# Patient Record
Sex: Female | Born: 1955
Health system: Southern US, Community
[De-identification: ages and names within clinical notes are randomized; demographics above are authoritative.]

## PROBLEM LIST (undated history)

## (undated) DIAGNOSIS — I252 Old myocardial infarction: Secondary | ICD-10-CM

## (undated) DIAGNOSIS — F32A Depression, unspecified: Secondary | ICD-10-CM

## (undated) DIAGNOSIS — M797 Fibromyalgia: Secondary | ICD-10-CM

## (undated) DIAGNOSIS — I509 Heart failure, unspecified: Secondary | ICD-10-CM

## (undated) DIAGNOSIS — G4730 Sleep apnea, unspecified: Secondary | ICD-10-CM

## (undated) DIAGNOSIS — I2510 Atherosclerotic heart disease of native coronary artery without angina pectoris: Secondary | ICD-10-CM

## (undated) DIAGNOSIS — I10 Essential (primary) hypertension: Secondary | ICD-10-CM

## (undated) DIAGNOSIS — J45998 Other asthma: Secondary | ICD-10-CM

## (undated) DIAGNOSIS — T7840XA Allergy, unspecified, initial encounter: Secondary | ICD-10-CM

## (undated) DIAGNOSIS — K2960 Other gastritis without bleeding: Secondary | ICD-10-CM

## (undated) DIAGNOSIS — K219 Gastro-esophageal reflux disease without esophagitis: Secondary | ICD-10-CM

## (undated) DIAGNOSIS — I160 Hypertensive urgency: Secondary | ICD-10-CM

## (undated) DIAGNOSIS — E559 Vitamin D deficiency, unspecified: Secondary | ICD-10-CM

## (undated) DIAGNOSIS — E785 Hyperlipidemia, unspecified: Secondary | ICD-10-CM

## (undated) DIAGNOSIS — Z8616 Personal history of COVID-19: Secondary | ICD-10-CM

## (undated) DIAGNOSIS — M1000 Idiopathic gout, unspecified site: Secondary | ICD-10-CM

## (undated) DIAGNOSIS — I219 Acute myocardial infarction, unspecified: Secondary | ICD-10-CM

## (undated) DIAGNOSIS — E042 Nontoxic multinodular goiter: Secondary | ICD-10-CM

## (undated) DIAGNOSIS — Z8619 Personal history of other infectious and parasitic diseases: Secondary | ICD-10-CM

## (undated) DIAGNOSIS — K2210 Ulcer of esophagus without bleeding: Secondary | ICD-10-CM

## (undated) DIAGNOSIS — K829 Disease of gallbladder, unspecified: Secondary | ICD-10-CM

## (undated) DIAGNOSIS — F329 Major depressive disorder, single episode, unspecified: Secondary | ICD-10-CM

## (undated) HISTORY — DX: Nontoxic multinodular goiter: E04.2

## (undated) HISTORY — PX: ABDOMINAL HYSTERECTOMY: SHX81

## (undated) HISTORY — DX: Vitamin D deficiency, unspecified: E55.9

## (undated) HISTORY — DX: Old myocardial infarction: I25.2

## (undated) HISTORY — DX: Allergy, unspecified, initial encounter: T78.40XA

## (undated) HISTORY — DX: Hyperlipidemia, unspecified: E78.5

## (undated) HISTORY — PX: HERNIA REPAIR: SHX51

## (undated) HISTORY — DX: Heart failure, unspecified: I50.9

## (undated) HISTORY — DX: Fibromyalgia: M79.7

## (undated) HISTORY — PX: APPENDECTOMY: SHX54

## (undated) HISTORY — DX: Personal history of other infectious and parasitic diseases: Z86.19

## (undated) HISTORY — DX: Gastro-esophageal reflux disease without esophagitis: K21.9

## (undated) HISTORY — DX: Acute myocardial infarction, unspecified: I21.9

## (undated) HISTORY — DX: Essential (primary) hypertension: I10

## (undated) HISTORY — DX: Hypertensive urgency: I16.0

## (undated) HISTORY — DX: Depression, unspecified: F32.A

## (undated) HISTORY — PX: CHOLECYSTECTOMY: SHX55

## (undated) HISTORY — PX: EYE SURGERY: SHX253

## (undated) HISTORY — PX: CARDIAC CATHETERIZATION: SHX172

## (undated) HISTORY — DX: Atherosclerotic heart disease of native coronary artery without angina pectoris: I25.10

## (undated) HISTORY — DX: Other gastritis without bleeding: K29.60

## (undated) HISTORY — DX: Disease of gallbladder, unspecified: K82.9

## (undated) HISTORY — DX: Other asthma: J45.998

## (undated) HISTORY — DX: Idiopathic gout, unspecified site: M10.00

## (undated) HISTORY — DX: Ulcer of esophagus without bleeding: K22.10

## (undated) HISTORY — DX: Sleep apnea, unspecified: G47.30

## (undated) HISTORY — PX: ROTATOR CUFF REPAIR: SHX139

## (undated) HISTORY — PX: TUBAL LIGATION: SHX77

## (undated) HISTORY — DX: Personal history of COVID-19: Z86.16

---
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
## 2014-08-14 DIAGNOSIS — I10 Essential (primary) hypertension: Secondary | ICD-10-CM | POA: Insufficient documentation

## 2014-08-14 DIAGNOSIS — I25119 Atherosclerotic heart disease of native coronary artery with unspecified angina pectoris: Secondary | ICD-10-CM | POA: Insufficient documentation

## 2014-08-14 DIAGNOSIS — E785 Hyperlipidemia, unspecified: Secondary | ICD-10-CM | POA: Insufficient documentation

## 2014-08-14 DIAGNOSIS — E782 Mixed hyperlipidemia: Secondary | ICD-10-CM | POA: Insufficient documentation

## 2014-08-14 DIAGNOSIS — I2510 Atherosclerotic heart disease of native coronary artery without angina pectoris: Secondary | ICD-10-CM | POA: Insufficient documentation

## 2014-08-14 HISTORY — DX: Mixed hyperlipidemia: E78.2

## 2014-09-11 DIAGNOSIS — M797 Fibromyalgia: Secondary | ICD-10-CM | POA: Insufficient documentation

## 2015-01-30 DIAGNOSIS — M25671 Stiffness of right ankle, not elsewhere classified: Secondary | ICD-10-CM | POA: Diagnosis not present

## 2015-01-30 DIAGNOSIS — M25571 Pain in right ankle and joints of right foot: Secondary | ICD-10-CM | POA: Diagnosis not present

## 2015-02-07 DIAGNOSIS — M25671 Stiffness of right ankle, not elsewhere classified: Secondary | ICD-10-CM | POA: Diagnosis not present

## 2015-02-07 DIAGNOSIS — M25571 Pain in right ankle and joints of right foot: Secondary | ICD-10-CM | POA: Diagnosis not present

## 2015-02-12 DIAGNOSIS — M25571 Pain in right ankle and joints of right foot: Secondary | ICD-10-CM | POA: Diagnosis not present

## 2015-02-12 DIAGNOSIS — M25671 Stiffness of right ankle, not elsewhere classified: Secondary | ICD-10-CM | POA: Diagnosis not present

## 2015-02-14 DIAGNOSIS — M25571 Pain in right ankle and joints of right foot: Secondary | ICD-10-CM | POA: Diagnosis not present

## 2015-02-14 DIAGNOSIS — M25671 Stiffness of right ankle, not elsewhere classified: Secondary | ICD-10-CM | POA: Diagnosis not present

## 2015-02-21 DIAGNOSIS — M25671 Stiffness of right ankle, not elsewhere classified: Secondary | ICD-10-CM | POA: Diagnosis not present

## 2015-02-21 DIAGNOSIS — M25571 Pain in right ankle and joints of right foot: Secondary | ICD-10-CM | POA: Diagnosis not present

## 2015-02-25 DIAGNOSIS — M76821 Posterior tibial tendinitis, right leg: Secondary | ICD-10-CM | POA: Diagnosis not present

## 2015-02-26 DIAGNOSIS — M25571 Pain in right ankle and joints of right foot: Secondary | ICD-10-CM | POA: Diagnosis not present

## 2015-02-26 DIAGNOSIS — M25671 Stiffness of right ankle, not elsewhere classified: Secondary | ICD-10-CM | POA: Diagnosis not present

## 2015-02-28 DIAGNOSIS — M25571 Pain in right ankle and joints of right foot: Secondary | ICD-10-CM | POA: Diagnosis not present

## 2015-02-28 DIAGNOSIS — M25671 Stiffness of right ankle, not elsewhere classified: Secondary | ICD-10-CM | POA: Diagnosis not present

## 2015-03-13 DIAGNOSIS — Z6841 Body Mass Index (BMI) 40.0 and over, adult: Secondary | ICD-10-CM | POA: Diagnosis not present

## 2015-03-13 DIAGNOSIS — Z1231 Encounter for screening mammogram for malignant neoplasm of breast: Secondary | ICD-10-CM | POA: Diagnosis not present

## 2015-03-13 DIAGNOSIS — Z13820 Encounter for screening for osteoporosis: Secondary | ICD-10-CM | POA: Diagnosis not present

## 2015-03-13 DIAGNOSIS — Z1211 Encounter for screening for malignant neoplasm of colon: Secondary | ICD-10-CM | POA: Diagnosis not present

## 2015-03-13 DIAGNOSIS — Z8541 Personal history of malignant neoplasm of cervix uteri: Secondary | ICD-10-CM | POA: Diagnosis not present

## 2015-03-13 DIAGNOSIS — Z0001 Encounter for general adult medical examination with abnormal findings: Secondary | ICD-10-CM | POA: Diagnosis not present

## 2015-03-14 DIAGNOSIS — M25571 Pain in right ankle and joints of right foot: Secondary | ICD-10-CM | POA: Diagnosis not present

## 2015-03-14 DIAGNOSIS — M25671 Stiffness of right ankle, not elsewhere classified: Secondary | ICD-10-CM | POA: Diagnosis not present

## 2015-03-22 DIAGNOSIS — Z1231 Encounter for screening mammogram for malignant neoplasm of breast: Secondary | ICD-10-CM | POA: Diagnosis not present

## 2015-03-26 DIAGNOSIS — R635 Abnormal weight gain: Secondary | ICD-10-CM | POA: Diagnosis not present

## 2015-03-26 DIAGNOSIS — N951 Menopausal and female climacteric states: Secondary | ICD-10-CM | POA: Diagnosis not present

## 2015-03-28 DIAGNOSIS — M25671 Stiffness of right ankle, not elsewhere classified: Secondary | ICD-10-CM | POA: Diagnosis not present

## 2015-03-28 DIAGNOSIS — M25571 Pain in right ankle and joints of right foot: Secondary | ICD-10-CM | POA: Diagnosis not present

## 2015-04-02 DIAGNOSIS — M25571 Pain in right ankle and joints of right foot: Secondary | ICD-10-CM | POA: Diagnosis not present

## 2015-04-02 DIAGNOSIS — M25671 Stiffness of right ankle, not elsewhere classified: Secondary | ICD-10-CM | POA: Diagnosis not present

## 2015-04-05 DIAGNOSIS — E538 Deficiency of other specified B group vitamins: Secondary | ICD-10-CM | POA: Diagnosis not present

## 2015-04-05 DIAGNOSIS — E161 Other hypoglycemia: Secondary | ICD-10-CM | POA: Diagnosis not present

## 2015-04-05 DIAGNOSIS — K219 Gastro-esophageal reflux disease without esophagitis: Secondary | ICD-10-CM | POA: Diagnosis not present

## 2015-04-05 DIAGNOSIS — N958 Other specified menopausal and perimenopausal disorders: Secondary | ICD-10-CM | POA: Diagnosis not present

## 2015-04-10 DIAGNOSIS — M25671 Stiffness of right ankle, not elsewhere classified: Secondary | ICD-10-CM | POA: Diagnosis not present

## 2015-04-10 DIAGNOSIS — M25571 Pain in right ankle and joints of right foot: Secondary | ICD-10-CM | POA: Diagnosis not present

## 2015-04-11 DIAGNOSIS — J4520 Mild intermittent asthma, uncomplicated: Secondary | ICD-10-CM | POA: Diagnosis not present

## 2015-04-11 DIAGNOSIS — F320 Major depressive disorder, single episode, mild: Secondary | ICD-10-CM | POA: Diagnosis not present

## 2015-04-11 DIAGNOSIS — E559 Vitamin D deficiency, unspecified: Secondary | ICD-10-CM | POA: Diagnosis not present

## 2015-04-11 DIAGNOSIS — I119 Hypertensive heart disease without heart failure: Secondary | ICD-10-CM | POA: Diagnosis not present

## 2015-04-11 DIAGNOSIS — E782 Mixed hyperlipidemia: Secondary | ICD-10-CM | POA: Diagnosis not present

## 2015-04-11 DIAGNOSIS — M797 Fibromyalgia: Secondary | ICD-10-CM | POA: Diagnosis not present

## 2015-04-11 DIAGNOSIS — Z6841 Body Mass Index (BMI) 40.0 and over, adult: Secondary | ICD-10-CM | POA: Diagnosis not present

## 2015-04-11 DIAGNOSIS — R7301 Impaired fasting glucose: Secondary | ICD-10-CM | POA: Diagnosis not present

## 2015-04-11 DIAGNOSIS — M791 Myalgia: Secondary | ICD-10-CM | POA: Diagnosis not present

## 2015-04-11 DIAGNOSIS — J45998 Other asthma: Secondary | ICD-10-CM | POA: Diagnosis not present

## 2015-04-15 DIAGNOSIS — E559 Vitamin D deficiency, unspecified: Secondary | ICD-10-CM | POA: Diagnosis not present

## 2015-04-15 DIAGNOSIS — K219 Gastro-esophageal reflux disease without esophagitis: Secondary | ICD-10-CM | POA: Diagnosis not present

## 2015-04-15 DIAGNOSIS — E161 Other hypoglycemia: Secondary | ICD-10-CM | POA: Diagnosis not present

## 2015-04-15 DIAGNOSIS — E538 Deficiency of other specified B group vitamins: Secondary | ICD-10-CM | POA: Diagnosis not present

## 2015-04-17 DIAGNOSIS — M25571 Pain in right ankle and joints of right foot: Secondary | ICD-10-CM | POA: Diagnosis not present

## 2015-04-17 DIAGNOSIS — M25671 Stiffness of right ankle, not elsewhere classified: Secondary | ICD-10-CM | POA: Diagnosis not present

## 2015-04-26 DIAGNOSIS — E559 Vitamin D deficiency, unspecified: Secondary | ICD-10-CM | POA: Diagnosis not present

## 2015-04-26 DIAGNOSIS — K219 Gastro-esophageal reflux disease without esophagitis: Secondary | ICD-10-CM | POA: Diagnosis not present

## 2015-04-26 DIAGNOSIS — E161 Other hypoglycemia: Secondary | ICD-10-CM | POA: Diagnosis not present

## 2015-04-26 DIAGNOSIS — E538 Deficiency of other specified B group vitamins: Secondary | ICD-10-CM | POA: Diagnosis not present

## 2015-05-03 DIAGNOSIS — K219 Gastro-esophageal reflux disease without esophagitis: Secondary | ICD-10-CM | POA: Diagnosis not present

## 2015-05-03 DIAGNOSIS — E538 Deficiency of other specified B group vitamins: Secondary | ICD-10-CM | POA: Diagnosis not present

## 2015-05-03 DIAGNOSIS — E161 Other hypoglycemia: Secondary | ICD-10-CM | POA: Diagnosis not present

## 2015-05-10 DIAGNOSIS — K219 Gastro-esophageal reflux disease without esophagitis: Secondary | ICD-10-CM | POA: Diagnosis not present

## 2015-05-10 DIAGNOSIS — E161 Other hypoglycemia: Secondary | ICD-10-CM | POA: Diagnosis not present

## 2015-05-10 DIAGNOSIS — E538 Deficiency of other specified B group vitamins: Secondary | ICD-10-CM | POA: Diagnosis not present

## 2015-05-17 DIAGNOSIS — E538 Deficiency of other specified B group vitamins: Secondary | ICD-10-CM | POA: Diagnosis not present

## 2015-05-17 DIAGNOSIS — K219 Gastro-esophageal reflux disease without esophagitis: Secondary | ICD-10-CM | POA: Diagnosis not present

## 2015-05-17 DIAGNOSIS — E161 Other hypoglycemia: Secondary | ICD-10-CM | POA: Diagnosis not present

## 2015-05-24 DIAGNOSIS — E538 Deficiency of other specified B group vitamins: Secondary | ICD-10-CM | POA: Diagnosis not present

## 2015-05-24 DIAGNOSIS — E161 Other hypoglycemia: Secondary | ICD-10-CM | POA: Diagnosis not present

## 2015-05-24 DIAGNOSIS — K219 Gastro-esophageal reflux disease without esophagitis: Secondary | ICD-10-CM | POA: Diagnosis not present

## 2015-05-31 DIAGNOSIS — K219 Gastro-esophageal reflux disease without esophagitis: Secondary | ICD-10-CM | POA: Diagnosis not present

## 2015-05-31 DIAGNOSIS — E161 Other hypoglycemia: Secondary | ICD-10-CM | POA: Diagnosis not present

## 2015-05-31 DIAGNOSIS — E538 Deficiency of other specified B group vitamins: Secondary | ICD-10-CM | POA: Diagnosis not present

## 2015-06-07 DIAGNOSIS — E538 Deficiency of other specified B group vitamins: Secondary | ICD-10-CM | POA: Diagnosis not present

## 2015-06-07 DIAGNOSIS — K219 Gastro-esophageal reflux disease without esophagitis: Secondary | ICD-10-CM | POA: Diagnosis not present

## 2015-06-07 DIAGNOSIS — E161 Other hypoglycemia: Secondary | ICD-10-CM | POA: Diagnosis not present

## 2015-06-14 DIAGNOSIS — K219 Gastro-esophageal reflux disease without esophagitis: Secondary | ICD-10-CM | POA: Diagnosis not present

## 2015-06-14 DIAGNOSIS — E161 Other hypoglycemia: Secondary | ICD-10-CM | POA: Diagnosis not present

## 2015-06-14 DIAGNOSIS — E538 Deficiency of other specified B group vitamins: Secondary | ICD-10-CM | POA: Diagnosis not present

## 2015-06-21 DIAGNOSIS — E538 Deficiency of other specified B group vitamins: Secondary | ICD-10-CM | POA: Diagnosis not present

## 2015-06-21 DIAGNOSIS — R635 Abnormal weight gain: Secondary | ICD-10-CM | POA: Diagnosis not present

## 2015-06-21 DIAGNOSIS — K219 Gastro-esophageal reflux disease without esophagitis: Secondary | ICD-10-CM | POA: Diagnosis not present

## 2015-06-21 DIAGNOSIS — E161 Other hypoglycemia: Secondary | ICD-10-CM | POA: Diagnosis not present

## 2015-06-22 DIAGNOSIS — H5203 Hypermetropia, bilateral: Secondary | ICD-10-CM | POA: Diagnosis not present

## 2015-06-22 DIAGNOSIS — H04123 Dry eye syndrome of bilateral lacrimal glands: Secondary | ICD-10-CM | POA: Diagnosis not present

## 2015-06-28 DIAGNOSIS — E538 Deficiency of other specified B group vitamins: Secondary | ICD-10-CM | POA: Diagnosis not present

## 2015-06-28 DIAGNOSIS — K219 Gastro-esophageal reflux disease without esophagitis: Secondary | ICD-10-CM | POA: Diagnosis not present

## 2015-06-28 DIAGNOSIS — E161 Other hypoglycemia: Secondary | ICD-10-CM | POA: Diagnosis not present

## 2015-07-05 DIAGNOSIS — E538 Deficiency of other specified B group vitamins: Secondary | ICD-10-CM | POA: Diagnosis not present

## 2015-07-05 DIAGNOSIS — E161 Other hypoglycemia: Secondary | ICD-10-CM | POA: Diagnosis not present

## 2015-07-05 DIAGNOSIS — K219 Gastro-esophageal reflux disease without esophagitis: Secondary | ICD-10-CM | POA: Diagnosis not present

## 2015-07-12 DIAGNOSIS — E161 Other hypoglycemia: Secondary | ICD-10-CM | POA: Diagnosis not present

## 2015-07-12 DIAGNOSIS — K219 Gastro-esophageal reflux disease without esophagitis: Secondary | ICD-10-CM | POA: Diagnosis not present

## 2015-07-12 DIAGNOSIS — E538 Deficiency of other specified B group vitamins: Secondary | ICD-10-CM | POA: Diagnosis not present

## 2015-07-17 DIAGNOSIS — I119 Hypertensive heart disease without heart failure: Secondary | ICD-10-CM | POA: Diagnosis not present

## 2015-07-17 DIAGNOSIS — M797 Fibromyalgia: Secondary | ICD-10-CM | POA: Diagnosis not present

## 2015-07-17 DIAGNOSIS — E559 Vitamin D deficiency, unspecified: Secondary | ICD-10-CM | POA: Diagnosis not present

## 2015-07-17 DIAGNOSIS — F320 Major depressive disorder, single episode, mild: Secondary | ICD-10-CM | POA: Diagnosis not present

## 2015-07-17 DIAGNOSIS — R7301 Impaired fasting glucose: Secondary | ICD-10-CM | POA: Diagnosis not present

## 2015-07-17 DIAGNOSIS — J45998 Other asthma: Secondary | ICD-10-CM | POA: Diagnosis not present

## 2015-07-17 DIAGNOSIS — Z6841 Body Mass Index (BMI) 40.0 and over, adult: Secondary | ICD-10-CM | POA: Diagnosis not present

## 2015-07-17 DIAGNOSIS — E782 Mixed hyperlipidemia: Secondary | ICD-10-CM | POA: Diagnosis not present

## 2015-07-26 DIAGNOSIS — E538 Deficiency of other specified B group vitamins: Secondary | ICD-10-CM | POA: Diagnosis not present

## 2015-07-26 DIAGNOSIS — K219 Gastro-esophageal reflux disease without esophagitis: Secondary | ICD-10-CM | POA: Diagnosis not present

## 2015-07-26 DIAGNOSIS — E161 Other hypoglycemia: Secondary | ICD-10-CM | POA: Diagnosis not present

## 2015-08-19 DIAGNOSIS — R1084 Generalized abdominal pain: Secondary | ICD-10-CM | POA: Diagnosis not present

## 2015-08-19 DIAGNOSIS — D3912 Neoplasm of uncertain behavior of left ovary: Secondary | ICD-10-CM | POA: Diagnosis not present

## 2015-08-19 DIAGNOSIS — R1012 Left upper quadrant pain: Secondary | ICD-10-CM | POA: Diagnosis not present

## 2015-09-20 DIAGNOSIS — R1012 Left upper quadrant pain: Secondary | ICD-10-CM | POA: Diagnosis not present

## 2015-09-20 DIAGNOSIS — D3912 Neoplasm of uncertain behavior of left ovary: Secondary | ICD-10-CM | POA: Diagnosis not present

## 2015-09-20 DIAGNOSIS — N281 Cyst of kidney, acquired: Secondary | ICD-10-CM | POA: Diagnosis not present

## 2015-09-20 DIAGNOSIS — I700 Atherosclerosis of aorta: Secondary | ICD-10-CM | POA: Diagnosis not present

## 2015-09-20 DIAGNOSIS — N83202 Unspecified ovarian cyst, left side: Secondary | ICD-10-CM | POA: Diagnosis not present

## 2015-10-02 DIAGNOSIS — R1012 Left upper quadrant pain: Secondary | ICD-10-CM | POA: Diagnosis not present

## 2015-10-02 DIAGNOSIS — D3912 Neoplasm of uncertain behavior of left ovary: Secondary | ICD-10-CM | POA: Diagnosis not present

## 2015-10-02 DIAGNOSIS — Z23 Encounter for immunization: Secondary | ICD-10-CM | POA: Diagnosis not present

## 2015-10-09 DIAGNOSIS — N281 Cyst of kidney, acquired: Secondary | ICD-10-CM | POA: Diagnosis not present

## 2015-10-10 DIAGNOSIS — N281 Cyst of kidney, acquired: Secondary | ICD-10-CM | POA: Diagnosis not present

## 2015-10-10 DIAGNOSIS — K760 Fatty (change of) liver, not elsewhere classified: Secondary | ICD-10-CM | POA: Diagnosis not present

## 2015-10-10 DIAGNOSIS — I700 Atherosclerosis of aorta: Secondary | ICD-10-CM | POA: Diagnosis not present

## 2015-10-10 DIAGNOSIS — N289 Disorder of kidney and ureter, unspecified: Secondary | ICD-10-CM | POA: Diagnosis not present

## 2015-10-28 DIAGNOSIS — E559 Vitamin D deficiency, unspecified: Secondary | ICD-10-CM | POA: Diagnosis not present

## 2015-10-28 DIAGNOSIS — I10 Essential (primary) hypertension: Secondary | ICD-10-CM | POA: Diagnosis not present

## 2015-10-28 DIAGNOSIS — E782 Mixed hyperlipidemia: Secondary | ICD-10-CM | POA: Diagnosis not present

## 2015-10-28 DIAGNOSIS — R7301 Impaired fasting glucose: Secondary | ICD-10-CM | POA: Diagnosis not present

## 2015-10-31 DIAGNOSIS — E782 Mixed hyperlipidemia: Secondary | ICD-10-CM | POA: Diagnosis not present

## 2015-10-31 DIAGNOSIS — F320 Major depressive disorder, single episode, mild: Secondary | ICD-10-CM | POA: Diagnosis not present

## 2015-10-31 DIAGNOSIS — R7301 Impaired fasting glucose: Secondary | ICD-10-CM | POA: Diagnosis not present

## 2015-10-31 DIAGNOSIS — Z6841 Body Mass Index (BMI) 40.0 and over, adult: Secondary | ICD-10-CM | POA: Diagnosis not present

## 2015-10-31 DIAGNOSIS — J45998 Other asthma: Secondary | ICD-10-CM | POA: Diagnosis not present

## 2015-10-31 DIAGNOSIS — M25571 Pain in right ankle and joints of right foot: Secondary | ICD-10-CM | POA: Diagnosis not present

## 2015-10-31 DIAGNOSIS — M797 Fibromyalgia: Secondary | ICD-10-CM | POA: Diagnosis not present

## 2015-10-31 DIAGNOSIS — E559 Vitamin D deficiency, unspecified: Secondary | ICD-10-CM | POA: Diagnosis not present

## 2015-10-31 DIAGNOSIS — I119 Hypertensive heart disease without heart failure: Secondary | ICD-10-CM | POA: Diagnosis not present

## 2016-02-11 DIAGNOSIS — K219 Gastro-esophageal reflux disease without esophagitis: Secondary | ICD-10-CM | POA: Diagnosis not present

## 2016-02-11 DIAGNOSIS — F320 Major depressive disorder, single episode, mild: Secondary | ICD-10-CM | POA: Diagnosis not present

## 2016-02-11 DIAGNOSIS — I10 Essential (primary) hypertension: Secondary | ICD-10-CM | POA: Diagnosis not present

## 2016-02-11 DIAGNOSIS — E782 Mixed hyperlipidemia: Secondary | ICD-10-CM | POA: Diagnosis not present

## 2016-02-11 DIAGNOSIS — E559 Vitamin D deficiency, unspecified: Secondary | ICD-10-CM | POA: Diagnosis not present

## 2016-02-11 DIAGNOSIS — M797 Fibromyalgia: Secondary | ICD-10-CM | POA: Diagnosis not present

## 2016-02-11 DIAGNOSIS — J45998 Other asthma: Secondary | ICD-10-CM | POA: Diagnosis not present

## 2016-02-11 DIAGNOSIS — I119 Hypertensive heart disease without heart failure: Secondary | ICD-10-CM | POA: Diagnosis not present

## 2016-02-11 DIAGNOSIS — R7301 Impaired fasting glucose: Secondary | ICD-10-CM | POA: Diagnosis not present

## 2016-02-11 DIAGNOSIS — Z6841 Body Mass Index (BMI) 40.0 and over, adult: Secondary | ICD-10-CM | POA: Diagnosis not present

## 2016-05-20 DIAGNOSIS — I10 Essential (primary) hypertension: Secondary | ICD-10-CM | POA: Diagnosis not present

## 2016-05-20 DIAGNOSIS — M25571 Pain in right ankle and joints of right foot: Secondary | ICD-10-CM | POA: Diagnosis not present

## 2016-05-20 DIAGNOSIS — R7301 Impaired fasting glucose: Secondary | ICD-10-CM | POA: Diagnosis not present

## 2016-05-20 DIAGNOSIS — F322 Major depressive disorder, single episode, severe without psychotic features: Secondary | ICD-10-CM | POA: Diagnosis not present

## 2016-05-20 DIAGNOSIS — Z6841 Body Mass Index (BMI) 40.0 and over, adult: Secondary | ICD-10-CM | POA: Diagnosis not present

## 2016-05-20 DIAGNOSIS — E782 Mixed hyperlipidemia: Secondary | ICD-10-CM | POA: Diagnosis not present

## 2016-05-20 DIAGNOSIS — I119 Hypertensive heart disease without heart failure: Secondary | ICD-10-CM | POA: Diagnosis not present

## 2016-06-09 DIAGNOSIS — I82811 Embolism and thrombosis of superficial veins of right lower extremities: Secondary | ICD-10-CM | POA: Diagnosis not present

## 2016-06-24 DIAGNOSIS — E663 Overweight: Secondary | ICD-10-CM | POA: Diagnosis not present

## 2016-06-24 DIAGNOSIS — M25571 Pain in right ankle and joints of right foot: Secondary | ICD-10-CM | POA: Diagnosis not present

## 2016-06-24 DIAGNOSIS — Z0001 Encounter for general adult medical examination with abnormal findings: Secondary | ICD-10-CM | POA: Diagnosis not present

## 2016-06-24 DIAGNOSIS — Z6841 Body Mass Index (BMI) 40.0 and over, adult: Secondary | ICD-10-CM | POA: Diagnosis not present

## 2016-06-26 DIAGNOSIS — M7731 Calcaneal spur, right foot: Secondary | ICD-10-CM | POA: Diagnosis not present

## 2016-06-26 DIAGNOSIS — M25571 Pain in right ankle and joints of right foot: Secondary | ICD-10-CM | POA: Diagnosis not present

## 2016-09-01 DIAGNOSIS — F322 Major depressive disorder, single episode, severe without psychotic features: Secondary | ICD-10-CM | POA: Diagnosis not present

## 2016-09-01 DIAGNOSIS — J45998 Other asthma: Secondary | ICD-10-CM | POA: Diagnosis not present

## 2016-09-01 DIAGNOSIS — Z13820 Encounter for screening for osteoporosis: Secondary | ICD-10-CM | POA: Diagnosis not present

## 2016-09-01 DIAGNOSIS — Z1231 Encounter for screening mammogram for malignant neoplasm of breast: Secondary | ICD-10-CM | POA: Diagnosis not present

## 2016-09-01 DIAGNOSIS — R7301 Impaired fasting glucose: Secondary | ICD-10-CM | POA: Diagnosis not present

## 2016-09-01 DIAGNOSIS — I119 Hypertensive heart disease without heart failure: Secondary | ICD-10-CM | POA: Diagnosis not present

## 2016-09-01 DIAGNOSIS — E782 Mixed hyperlipidemia: Secondary | ICD-10-CM | POA: Diagnosis not present

## 2016-09-01 DIAGNOSIS — Z6841 Body Mass Index (BMI) 40.0 and over, adult: Secondary | ICD-10-CM | POA: Diagnosis not present

## 2016-09-11 DIAGNOSIS — Z1231 Encounter for screening mammogram for malignant neoplasm of breast: Secondary | ICD-10-CM | POA: Diagnosis not present

## 2016-09-11 DIAGNOSIS — Z13820 Encounter for screening for osteoporosis: Secondary | ICD-10-CM | POA: Diagnosis not present

## 2016-09-11 DIAGNOSIS — M810 Age-related osteoporosis without current pathological fracture: Secondary | ICD-10-CM | POA: Diagnosis not present

## 2016-10-26 DIAGNOSIS — Z23 Encounter for immunization: Secondary | ICD-10-CM | POA: Diagnosis not present

## 2016-10-26 DIAGNOSIS — I119 Hypertensive heart disease without heart failure: Secondary | ICD-10-CM | POA: Diagnosis not present

## 2016-11-10 DIAGNOSIS — I119 Hypertensive heart disease without heart failure: Secondary | ICD-10-CM | POA: Diagnosis not present

## 2016-12-23 DIAGNOSIS — Z6841 Body Mass Index (BMI) 40.0 and over, adult: Secondary | ICD-10-CM | POA: Diagnosis not present

## 2016-12-23 DIAGNOSIS — F320 Major depressive disorder, single episode, mild: Secondary | ICD-10-CM | POA: Diagnosis not present

## 2016-12-23 DIAGNOSIS — E782 Mixed hyperlipidemia: Secondary | ICD-10-CM | POA: Diagnosis not present

## 2016-12-23 DIAGNOSIS — R7301 Impaired fasting glucose: Secondary | ICD-10-CM | POA: Diagnosis not present

## 2016-12-23 DIAGNOSIS — J45998 Other asthma: Secondary | ICD-10-CM | POA: Diagnosis not present

## 2016-12-23 DIAGNOSIS — I119 Hypertensive heart disease without heart failure: Secondary | ICD-10-CM | POA: Diagnosis not present

## 2017-02-02 DIAGNOSIS — W19XXXA Unspecified fall, initial encounter: Secondary | ICD-10-CM | POA: Diagnosis not present

## 2017-02-02 DIAGNOSIS — M25462 Effusion, left knee: Secondary | ICD-10-CM | POA: Diagnosis not present

## 2017-02-02 DIAGNOSIS — M25562 Pain in left knee: Secondary | ICD-10-CM | POA: Diagnosis not present

## 2017-02-03 DIAGNOSIS — M25562 Pain in left knee: Secondary | ICD-10-CM | POA: Diagnosis not present

## 2017-02-03 DIAGNOSIS — I10 Essential (primary) hypertension: Secondary | ICD-10-CM | POA: Diagnosis not present

## 2017-03-10 DIAGNOSIS — I25110 Atherosclerotic heart disease of native coronary artery with unstable angina pectoris: Secondary | ICD-10-CM | POA: Diagnosis not present

## 2017-03-10 DIAGNOSIS — I160 Hypertensive urgency: Secondary | ICD-10-CM | POA: Diagnosis not present

## 2017-03-10 DIAGNOSIS — R7301 Impaired fasting glucose: Secondary | ICD-10-CM | POA: Diagnosis not present

## 2017-03-10 DIAGNOSIS — R001 Bradycardia, unspecified: Secondary | ICD-10-CM | POA: Diagnosis not present

## 2017-03-10 DIAGNOSIS — I10 Essential (primary) hypertension: Secondary | ICD-10-CM | POA: Diagnosis not present

## 2017-03-10 DIAGNOSIS — E782 Mixed hyperlipidemia: Secondary | ICD-10-CM | POA: Diagnosis not present

## 2017-03-19 DIAGNOSIS — I700 Atherosclerosis of aorta: Secondary | ICD-10-CM | POA: Diagnosis not present

## 2017-03-19 DIAGNOSIS — N200 Calculus of kidney: Secondary | ICD-10-CM | POA: Diagnosis not present

## 2017-03-19 DIAGNOSIS — K760 Fatty (change of) liver, not elsewhere classified: Secondary | ICD-10-CM | POA: Diagnosis not present

## 2017-03-19 DIAGNOSIS — I160 Hypertensive urgency: Secondary | ICD-10-CM | POA: Diagnosis not present

## 2017-03-24 DIAGNOSIS — I119 Hypertensive heart disease without heart failure: Secondary | ICD-10-CM | POA: Diagnosis not present

## 2017-06-11 DIAGNOSIS — I119 Hypertensive heart disease without heart failure: Secondary | ICD-10-CM | POA: Diagnosis not present

## 2017-06-11 DIAGNOSIS — E782 Mixed hyperlipidemia: Secondary | ICD-10-CM | POA: Diagnosis not present

## 2017-06-11 DIAGNOSIS — R69 Illness, unspecified: Secondary | ICD-10-CM | POA: Diagnosis not present

## 2017-06-11 DIAGNOSIS — Z6841 Body Mass Index (BMI) 40.0 and over, adult: Secondary | ICD-10-CM | POA: Diagnosis not present

## 2017-06-11 DIAGNOSIS — J45998 Other asthma: Secondary | ICD-10-CM | POA: Diagnosis not present

## 2017-06-11 DIAGNOSIS — R7301 Impaired fasting glucose: Secondary | ICD-10-CM | POA: Diagnosis not present

## 2017-07-20 DIAGNOSIS — I252 Old myocardial infarction: Secondary | ICD-10-CM | POA: Diagnosis not present

## 2017-07-20 DIAGNOSIS — R32 Unspecified urinary incontinence: Secondary | ICD-10-CM | POA: Diagnosis not present

## 2017-07-20 DIAGNOSIS — E785 Hyperlipidemia, unspecified: Secondary | ICD-10-CM | POA: Diagnosis not present

## 2017-07-20 DIAGNOSIS — Z791 Long term (current) use of non-steroidal anti-inflammatories (NSAID): Secondary | ICD-10-CM | POA: Diagnosis not present

## 2017-07-20 DIAGNOSIS — K219 Gastro-esophageal reflux disease without esophagitis: Secondary | ICD-10-CM | POA: Diagnosis not present

## 2017-07-20 DIAGNOSIS — I10 Essential (primary) hypertension: Secondary | ICD-10-CM | POA: Diagnosis not present

## 2017-07-20 DIAGNOSIS — Z6841 Body Mass Index (BMI) 40.0 and over, adult: Secondary | ICD-10-CM | POA: Diagnosis not present

## 2017-07-20 DIAGNOSIS — G8929 Other chronic pain: Secondary | ICD-10-CM | POA: Diagnosis not present

## 2017-07-20 DIAGNOSIS — M810 Age-related osteoporosis without current pathological fracture: Secondary | ICD-10-CM | POA: Diagnosis not present

## 2017-09-20 DIAGNOSIS — R69 Illness, unspecified: Secondary | ICD-10-CM | POA: Diagnosis not present

## 2017-09-20 DIAGNOSIS — R7301 Impaired fasting glucose: Secondary | ICD-10-CM | POA: Diagnosis not present

## 2017-09-20 DIAGNOSIS — E782 Mixed hyperlipidemia: Secondary | ICD-10-CM | POA: Diagnosis not present

## 2017-09-20 DIAGNOSIS — I119 Hypertensive heart disease without heart failure: Secondary | ICD-10-CM | POA: Diagnosis not present

## 2017-09-20 DIAGNOSIS — J45998 Other asthma: Secondary | ICD-10-CM | POA: Diagnosis not present

## 2017-10-14 DIAGNOSIS — M25511 Pain in right shoulder: Secondary | ICD-10-CM | POA: Diagnosis not present

## 2017-10-20 DIAGNOSIS — Z0000 Encounter for general adult medical examination without abnormal findings: Secondary | ICD-10-CM | POA: Diagnosis not present

## 2017-10-20 DIAGNOSIS — M85852 Other specified disorders of bone density and structure, left thigh: Secondary | ICD-10-CM | POA: Diagnosis not present

## 2017-10-20 DIAGNOSIS — Z23 Encounter for immunization: Secondary | ICD-10-CM | POA: Diagnosis not present

## 2017-10-20 DIAGNOSIS — Z6841 Body Mass Index (BMI) 40.0 and over, adult: Secondary | ICD-10-CM | POA: Diagnosis not present

## 2017-10-26 DIAGNOSIS — Z1231 Encounter for screening mammogram for malignant neoplasm of breast: Secondary | ICD-10-CM | POA: Diagnosis not present

## 2017-12-22 DIAGNOSIS — I119 Hypertensive heart disease without heart failure: Secondary | ICD-10-CM | POA: Diagnosis not present

## 2017-12-22 DIAGNOSIS — R7301 Impaired fasting glucose: Secondary | ICD-10-CM | POA: Diagnosis not present

## 2017-12-22 DIAGNOSIS — M79672 Pain in left foot: Secondary | ICD-10-CM | POA: Diagnosis not present

## 2017-12-22 DIAGNOSIS — M79671 Pain in right foot: Secondary | ICD-10-CM | POA: Diagnosis not present

## 2017-12-22 DIAGNOSIS — I2510 Atherosclerotic heart disease of native coronary artery without angina pectoris: Secondary | ICD-10-CM | POA: Diagnosis not present

## 2017-12-22 DIAGNOSIS — R69 Illness, unspecified: Secondary | ICD-10-CM | POA: Diagnosis not present

## 2017-12-22 DIAGNOSIS — E782 Mixed hyperlipidemia: Secondary | ICD-10-CM | POA: Diagnosis not present

## 2017-12-22 DIAGNOSIS — J45998 Other asthma: Secondary | ICD-10-CM | POA: Diagnosis not present

## 2018-01-14 DIAGNOSIS — M10071 Idiopathic gout, right ankle and foot: Secondary | ICD-10-CM | POA: Diagnosis not present

## 2018-01-14 DIAGNOSIS — I10 Essential (primary) hypertension: Secondary | ICD-10-CM | POA: Diagnosis not present

## 2018-01-14 DIAGNOSIS — I119 Hypertensive heart disease without heart failure: Secondary | ICD-10-CM | POA: Diagnosis not present

## 2018-03-31 DIAGNOSIS — R7301 Impaired fasting glucose: Secondary | ICD-10-CM | POA: Diagnosis not present

## 2018-03-31 DIAGNOSIS — I2510 Atherosclerotic heart disease of native coronary artery without angina pectoris: Secondary | ICD-10-CM | POA: Diagnosis not present

## 2018-03-31 DIAGNOSIS — I119 Hypertensive heart disease without heart failure: Secondary | ICD-10-CM | POA: Diagnosis not present

## 2018-03-31 DIAGNOSIS — M79671 Pain in right foot: Secondary | ICD-10-CM | POA: Diagnosis not present

## 2018-03-31 DIAGNOSIS — J45998 Other asthma: Secondary | ICD-10-CM | POA: Diagnosis not present

## 2018-03-31 DIAGNOSIS — E782 Mixed hyperlipidemia: Secondary | ICD-10-CM | POA: Diagnosis not present

## 2018-03-31 DIAGNOSIS — F320 Major depressive disorder, single episode, mild: Secondary | ICD-10-CM | POA: Diagnosis not present

## 2018-04-26 ENCOUNTER — Other Ambulatory Visit: Payer: Self-pay | Admitting: *Deleted

## 2018-04-26 NOTE — Patient Outreach (Signed)
HTA HRA Follow up outreach. No answer, but able to leave a message and requested a return call.  Eulah Pont. Myrtie Neither, MSN, [facility] Gerontological Nurse Practitioner [facility] Care Management [phone]

## 2018-06-24 ENCOUNTER — Encounter: Payer: Self-pay | Admitting: *Deleted

## 2018-06-24 ENCOUNTER — Other Ambulatory Visit: Payer: Self-pay | Admitting: *Deleted

## 2018-06-24 NOTE — Patient Outreach (Signed)
HRA Telephone screen attempted, call #2. Left message to return my call. Advised I would be sending our information.  Eulah Pont. Myrtie Neither, MSN, [facility] Gerontological Nurse Practitioner [facility] Care Management [phone]

## 2018-06-27 ENCOUNTER — Other Ambulatory Visit: Payer: Self-pay

## 2018-06-27 ENCOUNTER — Encounter: Payer: Self-pay | Admitting: *Deleted

## 2018-06-27 ENCOUNTER — Other Ambulatory Visit: Payer: Self-pay | Admitting: *Deleted

## 2018-06-27 NOTE — Patient Outreach (Signed)
Completion of initial assessment. Cheryl Beard has a hx of MI, CAD, HTN, Hyperlipidemia, GERD, Fibromyalgia,  Gout, seasonal allergies.  She is married and very independent. She is able to drive. They are able to afford groceries and their medications.  She does not have a HCPOA or Living Will  Her main health issue is managing her CAD. Her diet could be improved but she and her husband do make some effort. They do not use salt but they do eat processed foods. They both could lose weight. They both need to exercise. These are the goals I would carry forward for this pt and her husband.    We discussed heart healthy diet, low fat item when available, lean meats, lots of fruits and veggies. Read labels and look for low salt/sodium content with goal of < 2000 mg a day.  Encouraged walking, every day. Avoid days when the air index is poor and exercise at the coolest part of the day during the summer.  Also discussed COVID19 safety precautions and home safety precautions.  Encouraged completion of  Advanced Directives, sent copy.  Cheryl Beard agreed to be in an every three month check in call schedule for CAD coaching.  Eulah Pont. Myrtie Neither, MSN, [facility] Gerontological Nurse Practitioner [facility] Care Management [phone]

## 2018-06-27 NOTE — Patient Outreach (Signed)
Outpatient Encounter Medications as of 06/27/2018  Medication Sig  . Albuterol Sulfate 108 (90 Base) MCG/ACT AEPB Inhale 2 puffs into the lungs as needed (for wheezing).  [name] allopurinol (ZYLOPRIM) 100 MG tablet Take 100 mg by mouth daily.  [name] aspirin EC 81 MG tablet Take 81 mg by mouth daily.  . Azilsartan Medoxomil (EDARBI) 80 MG TABS Take by mouth.  . calcium carbonate (TUMS - DOSED IN MG ELEMENTAL CALCIUM) 500 MG chewable tablet Chew 1 tablet by mouth 2 (two) times daily.  . chlorthalidone (HYGROTON) 50 MG tablet Take 50 mg by mouth daily.  . metoprolol succinate (TOPROL-XL) 50 MG 24 hr tablet Take 50 mg by mouth daily. Take 1/2 tab = 25 mg daily.  . Multiple Vitamins-Minerals (WOMENS MULTIVITAMIN PLUS PO) Take 1 tablet by mouth every morning.  . Omega-3 Fatty Acids (FISH OIL) 1000 MG CAPS Take 2 capsules by mouth daily.  . pantoprazole (PROTONIX) 40 MG tablet Take 40 mg by mouth daily.   No facility-administered encounter medications on file as of 06/27/2018.

## 2018-07-04 DIAGNOSIS — I119 Hypertensive heart disease without heart failure: Secondary | ICD-10-CM | POA: Diagnosis not present

## 2018-07-04 DIAGNOSIS — R7301 Impaired fasting glucose: Secondary | ICD-10-CM | POA: Diagnosis not present

## 2018-07-04 DIAGNOSIS — I2510 Atherosclerotic heart disease of native coronary artery without angina pectoris: Secondary | ICD-10-CM | POA: Diagnosis not present

## 2018-07-04 DIAGNOSIS — F320 Major depressive disorder, single episode, mild: Secondary | ICD-10-CM | POA: Diagnosis not present

## 2018-07-04 DIAGNOSIS — J45998 Other asthma: Secondary | ICD-10-CM | POA: Diagnosis not present

## 2018-07-04 DIAGNOSIS — E782 Mixed hyperlipidemia: Secondary | ICD-10-CM | POA: Diagnosis not present

## 2018-10-06 DIAGNOSIS — F320 Major depressive disorder, single episode, mild: Secondary | ICD-10-CM | POA: Diagnosis not present

## 2018-10-06 DIAGNOSIS — I2510 Atherosclerotic heart disease of native coronary artery without angina pectoris: Secondary | ICD-10-CM | POA: Diagnosis not present

## 2018-10-06 DIAGNOSIS — Z23 Encounter for immunization: Secondary | ICD-10-CM | POA: Diagnosis not present

## 2018-10-06 DIAGNOSIS — J45998 Other asthma: Secondary | ICD-10-CM | POA: Diagnosis not present

## 2018-10-06 DIAGNOSIS — E782 Mixed hyperlipidemia: Secondary | ICD-10-CM | POA: Diagnosis not present

## 2018-10-06 DIAGNOSIS — R7301 Impaired fasting glucose: Secondary | ICD-10-CM | POA: Diagnosis not present

## 2018-10-06 DIAGNOSIS — I119 Hypertensive heart disease without heart failure: Secondary | ICD-10-CM | POA: Diagnosis not present

## 2018-10-10 ENCOUNTER — Other Ambulatory Visit: Payer: PPO | Admitting: *Deleted

## 2018-10-26 DIAGNOSIS — Z6841 Body Mass Index (BMI) 40.0 and over, adult: Secondary | ICD-10-CM | POA: Diagnosis not present

## 2018-10-26 DIAGNOSIS — Z23 Encounter for immunization: Secondary | ICD-10-CM | POA: Diagnosis not present

## 2018-10-26 DIAGNOSIS — Z0000 Encounter for general adult medical examination without abnormal findings: Secondary | ICD-10-CM | POA: Diagnosis not present

## 2018-12-13 ENCOUNTER — Other Ambulatory Visit: Payer: Self-pay | Admitting: *Deleted

## 2018-12-31 DIAGNOSIS — J0100 Acute maxillary sinusitis, unspecified: Secondary | ICD-10-CM | POA: Diagnosis not present

## 2018-12-31 DIAGNOSIS — I10 Essential (primary) hypertension: Secondary | ICD-10-CM | POA: Diagnosis not present

## 2018-12-31 DIAGNOSIS — Z20828 Contact with and (suspected) exposure to other viral communicable diseases: Secondary | ICD-10-CM | POA: Diagnosis not present

## 2018-12-31 DIAGNOSIS — R110 Nausea: Secondary | ICD-10-CM | POA: Diagnosis not present

## 2018-12-31 DIAGNOSIS — R05 Cough: Secondary | ICD-10-CM | POA: Diagnosis not present

## 2019-01-05 DIAGNOSIS — R531 Weakness: Secondary | ICD-10-CM | POA: Diagnosis not present

## 2019-01-05 DIAGNOSIS — E860 Dehydration: Secondary | ICD-10-CM | POA: Diagnosis not present

## 2019-01-05 DIAGNOSIS — R112 Nausea with vomiting, unspecified: Secondary | ICD-10-CM | POA: Diagnosis not present

## 2019-01-05 DIAGNOSIS — U071 COVID-19: Secondary | ICD-10-CM | POA: Diagnosis not present

## 2019-01-18 DIAGNOSIS — I119 Hypertensive heart disease without heart failure: Secondary | ICD-10-CM | POA: Diagnosis not present

## 2019-01-18 DIAGNOSIS — E782 Mixed hyperlipidemia: Secondary | ICD-10-CM | POA: Diagnosis not present

## 2019-01-18 DIAGNOSIS — F320 Major depressive disorder, single episode, mild: Secondary | ICD-10-CM | POA: Diagnosis not present

## 2019-01-18 DIAGNOSIS — I2510 Atherosclerotic heart disease of native coronary artery without angina pectoris: Secondary | ICD-10-CM | POA: Diagnosis not present

## 2019-01-18 DIAGNOSIS — R7301 Impaired fasting glucose: Secondary | ICD-10-CM | POA: Diagnosis not present

## 2019-01-18 DIAGNOSIS — U071 COVID-19: Secondary | ICD-10-CM | POA: Diagnosis not present

## 2019-01-18 DIAGNOSIS — J45998 Other asthma: Secondary | ICD-10-CM | POA: Diagnosis not present

## 2019-01-18 DIAGNOSIS — J1289 Other viral pneumonia: Secondary | ICD-10-CM | POA: Diagnosis not present

## 2019-02-05 DIAGNOSIS — U071 COVID-19: Secondary | ICD-10-CM | POA: Diagnosis not present

## 2019-03-08 DIAGNOSIS — U071 COVID-19: Secondary | ICD-10-CM | POA: Diagnosis not present

## 2019-04-03 ENCOUNTER — Other Ambulatory Visit: Payer: Self-pay | Admitting: Family Medicine

## 2019-04-04 ENCOUNTER — Other Ambulatory Visit: Payer: Self-pay

## 2019-04-04 MED ORDER — ALLOPURINOL 100 MG PO TABS: 100.0000 mg | ORAL_TABLET | Freq: Every day | ORAL | 1 refills | Status: DC

## 2019-04-04 MED ORDER — METOPROLOL SUCCINATE ER 50 MG PO TB24: 25.0000 mg | ORAL_TABLET | Freq: Every day | ORAL | 0 refills | Status: DC

## 2019-04-04 MED ORDER — CHLORTHALIDONE 50 MG PO TABS: 50.0000 mg | ORAL_TABLET | Freq: Every day | ORAL | 0 refills | Status: DC

## 2019-04-05 DIAGNOSIS — U071 COVID-19: Secondary | ICD-10-CM | POA: Diagnosis not present

## 2019-04-26 ENCOUNTER — Other Ambulatory Visit: Payer: Self-pay | Admitting: Family Medicine

## 2019-05-06 DIAGNOSIS — U071 COVID-19: Secondary | ICD-10-CM | POA: Diagnosis not present

## 2019-05-09 ENCOUNTER — Encounter: Payer: Self-pay | Admitting: Family Medicine

## 2019-05-09 NOTE — Progress Notes (Signed)
Established Patient Office Visit  Subjective:  Patient ID: Cheryl Beard, female    DOB: May 11, 1955  Age: 64 y.o. MRN: [id number]  CC:  Chief Complaint  Patient presents with  . Depression  . Hyperlipidemia  . Gastroesophageal Reflux    HPI Pt presents with hyperlipidemia.  Current treatment includes Crestor.  Compliance with treatment has been good; she takes her medication as directed, maintains her low cholesterol diet, and follows up as directed.  She denies experiencing any hypercholesterolemia related symptoms.      Cheryl Beard presents with a diagnosis of impaired fasting glucose.  The course has been stable and nonprogressive.      Pt presents for follow up of hypertension.  Her current cardiac medication regimen includes a diuretic ( chlorthalidone ), a beta-blocker ( toprol xl ), and an angiotensin receptor blocker Cheryl Beard ).  She is tolerating the medication well without side effects.  Compliance with treatment has been good; she takes her medication as directed, maintains her diet and exercise regimen, and follows up as directed.  Hypertension associated with history of myocardial infarction.    Typical diet includes low carbohydrate and no sugar.  She follows a 1200-1500 calorie diet.  Primary form of exercise is walking.  She states that daily water intake is 100 oz ounces.  Compliance with treatment has been good; she maintains her diet and exercise regimen and follows up as directed.      [name] presents with a diagnosis of other asthma.  The course has been stable and nonprogressive.  Using Symbicort 2 puffs one to two times per day as needed.    In regard to the atherosclerotic heart disease of native coronary artery without angina pectoris, she is here today for routine follow-up.  Noncritical. taking statin and metoprolol.     Past Medical History:  Diagnosis Date  . Allergy   . CAD (coronary artery disease)   . Depression   . Erosive esophagitis   . Erosive  gastritis   . Gallbladder disease   . GERD (gastroesophageal reflux disease)   . History of Clostridium difficile infection   . History of COVID-19   . History of myocardial infarction   . Hyperlipidemia   . Hypertension   . Hypertensive urgency   . Idiopathic gout   . Myocardial infarction (Cheryl Beard)   . Nontoxic multinodular goiter   . Other asthma   . Vitamin D deficiency     Past Surgical History:  Procedure Laterality Date  . ABDOMINAL HYSTERECTOMY    . APPENDECTOMY    . CHOLECYSTECTOMY    . ROTATOR CUFF REPAIR      Family History  Problem Relation Age of Onset  . Cancer Mother   . Parkinson's disease Mother   . Diabetes Mother   . Hypertension Mother   . Stroke Father   . Hyperlipidemia Sister   . Hypertension Sister   . Alzheimer's disease Maternal Grandmother   . Fibromyalgia Sister   . Cancer Sister   . Cancer Paternal Grandmother        Leukemia    Social History   Socioeconomic History  . Marital status: Married    Spouse name: Not on file  . Number of children: 3  . Years of education: Not on file  . Highest education level: Not on file  Occupational History  . Not on file  Tobacco Use  . Smoking status: Former Research scientist (life sciences)  . Smokeless tobacco: Never Used  Substance and Sexual  Activity  . Alcohol use: Never  . Drug use: Never  . Sexual activity: Not on file  Other Topics Concern  . Not on file  Social History Narrative  . Not on file   Social Determinants of Health   Financial Resource Strain:   . Difficulty of Paying Living Expenses:   Food Insecurity:   . Worried About Charity fundraiser in the Last Year:   . Arboriculturist in the Last Year:   Transportation Needs:   . Film/video editor (Medical):   [name] Lack of Transportation (Non-Medical):   Physical Activity:   . Days of Exercise per Week:   . Minutes of Exercise per Session:   Stress:   . Feeling of Stress :   Social Connections:   . Frequency of Communication with Friends and  Family:   . Frequency of Social Gatherings with Friends and Family:   . Attends Religious Services:   . Active Member of Clubs or Organizations:   . Attends Archivist Meetings:   [name] Marital Status:   Intimate Partner Violence:   . Fear of Current or Ex-Partner:   . Emotionally Abused:   [name] Physically Abused:   . Sexually Abused:     Outpatient Medications Prior to Visit  Medication Sig Dispense Refill  . cholecalciferol (VITAMIN D3) 25 MCG (1000 UNIT) tablet Take 1,000 Units by mouth daily.    . Albuterol Sulfate 108 (90 Base) MCG/ACT AEPB Inhale 2 puffs into the lungs as needed (for wheezing).    [name] allopurinol (ZYLOPRIM) 100 MG tablet Take 1 tablet (100 mg total) by mouth daily. 90 tablet 1  . aspirin EC 81 MG tablet Take 81 mg by mouth daily.    . calcium carbonate (TUMS - DOSED IN MG ELEMENTAL CALCIUM) 500 MG chewable tablet Chew 1 tablet by mouth 2 (two) times daily.    . chlorthalidone (HYGROTON) 50 MG tablet Take 1 tablet (50 mg total) by mouth daily. 90 tablet 0  . EDARBI 80 MG TABS TAKE ONE TABLET BY MOUTH DAILY  90 tablet 3  . metoprolol succinate (TOPROL-XL) 50 MG 24 hr tablet Take 1 tablet (50 mg total) by mouth daily. Take 1/2 tab = 25 mg daily. 45 tablet 0  . Multiple Vitamins-Minerals (WOMENS MULTIVITAMIN PLUS PO) Take 1 tablet by mouth every morning.    . Omega-3 Fatty Acids (FISH OIL) 1000 MG CAPS Take 2 capsules by mouth daily.    . pantoprazole (PROTONIX) 40 MG tablet TAKE 1 TABLET BY MOUTH DAILY 90 tablet 3  . rosuvastatin (CRESTOR) 20 MG tablet Take 20 mg by mouth at bedtime.     No facility-administered medications prior to visit.    Allergies  Allergen Reactions  . Candesartan   . Clarithromycin   . Colesevelam   . Duloxetine   . Pregabalin     ROS Review of Systems  Constitutional: Positive for fatigue. Negative for chills, diaphoresis and fever.  HENT: Positive for congestion. Negative for ear pain, rhinorrhea and sore throat.        Ears  itch. PND.  Respiratory: Negative for cough and shortness of breath.   Cardiovascular: Negative for chest pain.  Gastrointestinal: Negative for abdominal pain, constipation, diarrhea, nausea and vomiting.       Indigestion about 3 times a a week. Awakens her in the middle of the night. Taking pantoprazole, not sure if is helping.  Patient has been on prilosec, which did not work.  Genitourinary: Negative for dysuria and urgency.  Musculoskeletal: Positive for arthralgias. Negative for back pain and myalgias.       Knee pain she thinks due to weight gain. Not exercising since covid 19 in December. She is eating healthy.  No gout flare up for several months and then had a gout flare up in her left toe. Did not take colchicine. Patient has been on allopurinol for more than 3 months.  Skin:       Hair falling out since December and had Covid 19.  Neurological: Negative for dizziness, weakness, light-headedness and headaches.  Psychiatric/Behavioral: Negative for dysphoric mood. The patient is nervous/anxious.       Objective:    Physical Exam  Constitutional: She is oriented to person, place, and time. She appears well-developed and well-nourished.  Obese.  HENT:  Head: Normocephalic.  Right Ear: External ear normal.  Left Ear: External ear normal.  Mouth/Throat: Oropharynx is clear and moist. No oropharyngeal exudate.  Cardiovascular: Normal rate, regular rhythm and normal heart sounds.  Pulmonary/Chest: Effort normal and breath sounds normal. No respiratory distress.  Abdominal: There is no abdominal tenderness.  Musculoskeletal:        General: No edema.  Neurological: She is alert and oriented to person, place, and time.  Skin:  Hair thinning.  Psychiatric: She has a normal mood and affect. Her behavior is normal.    BP 112/78   Pulse 84   Temp 97.7 F (36.5 C)   Resp 18   Ht 5\' 3"  (1.6 m)   Wt 272 lb (123.4 kg)   BMI 48.18 kg/m  Wt Readings from Last 3 Encounters:    05/10/19 272 lb (123.4 kg)     Health Maintenance Due  Topic Date Due  . Hepatitis C Screening  Never done  . HIV Screening  Never done  . COVID-19 Vaccine (1) Never done  . TETANUS/TDAP  Never done  . PAP SMEAR-Modifier  Never done  . MAMMOGRAM  Never done  . COLONOSCOPY  Never done  . DEXA SCAN  Never done  . PNA vac Low Risk Adult (1 of 2 - PCV13) Never done    There are no preventive care reminders to display for this patient.  Lab Results  Component Value Date   TSH 3.130 05/10/2019   Lab Results  Component Value Date   WBC 8.6 05/10/2019   HGB 13.2 05/10/2019   HCT 39.2 05/10/2019   MCV 93 05/10/2019   PLT 221 05/10/2019   Lab Results  Component Value Date   NA 141 05/10/2019   K 4.4 05/10/2019   CO2 23 05/10/2019   GLUCOSE 114 (H) 05/10/2019   BUN 27 05/10/2019   CREATININE 0.99 05/10/2019   BILITOT <0.2 05/10/2019   ALKPHOS 96 05/10/2019   AST 42 (H) 05/10/2019   ALT 42 (H) 05/10/2019   PROT 6.8 05/10/2019   ALBUMIN 4.4 05/10/2019   CALCIUM 9.5 05/10/2019   Lab Results  Component Value Date   CHOL 114 05/10/2019   Lab Results  Component Value Date   HDL 39 (L) 05/10/2019   Lab Results  Component Value Date   LDLCALC 51 05/10/2019   Lab Results  Component Value Date   TRIG 134 05/10/2019   Lab Results  Component Value Date   CHOLHDL 2.9 05/10/2019   No results found for: HGBA1C    Assessment & Plan:  1. Mixed hyperlipidemia Well controlled.  No changes to medicines.  Continue to work on  eating a healthy diet and exercise.  Labs drawn today.  - Lipid panel  2. Hypertensive heart disease without heart failure Well controlled.  No changes to medicines.  Continue to work on eating a healthy diet and exercise.  Labs drawn today.  - Comprehensive metabolic panel - CBC with Differential/Platelet  3. Nontoxic multinodular goiter - TSH  4. Chronic idiopathic gout involving toe of left foot without tophus Send colchicine 0.1 mg  one twice a day for 1 week as needed if has a gout flare up.  Continue allopurinol 100 mg once daily.   5. GERD without esophagitis Change pantoprazole to before supper or before bed x 2 weeks. If persistent heartburn increase to twice a day.  6. Impaired fasting glucose Low sugar diet.  7. Telogen effluvium Likely due to covid 19 infection. - Ferritin  8. Morbid obesity ([place]) Recommend eat healthy and exercise.  9. BMI 45.0-49.9, adult ([place]) Recommend eat healthy and exercise.   Orders Placed This Encounter  Procedures  . Lipid panel  . Comprehensive metabolic panel  . CBC with Differential/Platelet  . TSH  . Ferritin  . Cardiovascular Risk Assessment     Follow-up: Return in about 3 months (around 08/09/2019) for fasting.    [name], MD

## 2019-05-10 ENCOUNTER — Encounter: Payer: Self-pay | Admitting: Family Medicine

## 2019-05-10 ENCOUNTER — Other Ambulatory Visit: Payer: Self-pay

## 2019-05-10 ENCOUNTER — Ambulatory Visit (INDEPENDENT_AMBULATORY_CARE_PROVIDER_SITE_OTHER): Payer: PPO | Admitting: Family Medicine

## 2019-05-10 VITALS — BP 112/78 | HR 84 | Temp 97.7°F | Resp 18 | Ht 63.0 in | Wt 272.0 lb

## 2019-05-10 DIAGNOSIS — Z6841 Body Mass Index (BMI) 40.0 and over, adult: Secondary | ICD-10-CM

## 2019-05-10 DIAGNOSIS — K219 Gastro-esophageal reflux disease without esophagitis: Secondary | ICD-10-CM | POA: Insufficient documentation

## 2019-05-10 DIAGNOSIS — L650 Telogen effluvium: Secondary | ICD-10-CM | POA: Insufficient documentation

## 2019-05-10 DIAGNOSIS — E782 Mixed hyperlipidemia: Secondary | ICD-10-CM

## 2019-05-10 DIAGNOSIS — I119 Hypertensive heart disease without heart failure: Secondary | ICD-10-CM

## 2019-05-10 DIAGNOSIS — E042 Nontoxic multinodular goiter: Secondary | ICD-10-CM | POA: Diagnosis not present

## 2019-05-10 DIAGNOSIS — M1A0720 Idiopathic chronic gout, left ankle and foot, without tophus (tophi): Secondary | ICD-10-CM

## 2019-05-10 DIAGNOSIS — R7301 Impaired fasting glucose: Secondary | ICD-10-CM | POA: Diagnosis not present

## 2019-05-10 HISTORY — DX: Telogen effluvium: L65.0

## 2019-05-10 HISTORY — DX: Hypertensive heart disease without heart failure: I11.9

## 2019-05-10 HISTORY — DX: Idiopathic chronic gout, left ankle and foot, without tophus (tophi): M1A.0720

## 2019-05-10 HISTORY — DX: Gastro-esophageal reflux disease without esophagitis: K21.9

## 2019-05-10 NOTE — Patient Instructions (Addendum)
Gout - send colchicine 0.1 mg one twice a day for 1 week as needed if has a gout flare up.  Gerd - change pantoprazole to before supper or before bed x 2 weeks. If persistent heartburn increase to twice a day. Hair loss - check labs. Continue biotin.  Hypertension - continue current bp medications.  Impaired sugar - recommend diabetes. High cholesterol - continue current medication.  DASH Eating Plan DASH stands for "Dietary Approaches to Stop Hypertension." The DASH eating plan is a healthy eating plan that has been shown to reduce high blood pressure (hypertension). It may also reduce your risk for type 2 diabetes, heart disease, and stroke. The DASH eating plan may also help with weight loss. What are tips for following this plan?  General guidelines  Avoid eating more than 2,300 mg (milligrams) of salt (sodium) a day. If you have hypertension, you may need to reduce your sodium intake to 1,500 mg a day.  Limit alcohol intake to no more than 1 drink a day for nonpregnant women and 2 drinks a day for men. One drink equals 12 oz of beer, 5 oz of wine, or 1 oz of hard liquor.  Work with your health care provider to maintain a healthy body weight or to lose weight. Ask what an ideal weight is for you.  Get at least 30 minutes of exercise that causes your heart to beat faster (aerobic exercise) most days of the week. Activities may include walking, swimming, or biking.  Work with your health care provider or diet and nutrition specialist (dietitian) to adjust your eating plan to your individual calorie needs. Reading food labels   Check food labels for the amount of sodium per serving. Choose foods with less than 5 percent of the Daily Value of sodium. Generally, foods with less than 300 mg of sodium per serving fit into this eating plan.  To find whole grains, look for the word "whole" as the first word in the ingredient list. Shopping  Buy products labeled as "low-sodium" or "no salt  added."  Buy fresh foods. Avoid canned foods and premade or frozen meals. Cooking  Avoid adding salt when cooking. Use salt-free seasonings or herbs instead of table salt or sea salt. Check with your health care provider or pharmacist before using salt substitutes.  Do not fry foods. Cook foods using healthy methods such as baking, boiling, grilling, and broiling instead.  Cook with heart-healthy oils, such as olive, canola, soybean, or sunflower oil. Meal planning  Eat a balanced diet that includes: ? 5 or more servings of fruits and vegetables each day. At each meal, try to fill half of your plate with fruits and vegetables. ? Up to 6-8 servings of whole grains each day. ? Less than 6 oz of lean meat, poultry, or fish each day. A 3-oz serving of meat is about the same size as a deck of cards. One egg equals 1 oz. ? 2 servings of low-fat dairy each day. ? A serving of nuts, seeds, or beans 5 times each week. ? Heart-healthy fats. Healthy fats called Omega-3 fatty acids are found in foods such as flaxseeds and coldwater fish, like sardines, salmon, and mackerel.  Limit how much you eat of the following: ? Canned or prepackaged foods. ? Food that is high in trans fat, such as fried foods. ? Food that is high in saturated fat, such as fatty meat. ? Sweets, desserts, sugary drinks, and other foods with added sugar. ? Full-fat  dairy products.  Do not salt foods before eating.  Try to eat at least 2 vegetarian meals each week.  Eat more home-cooked food and less restaurant, buffet, and fast food.  When eating at a restaurant, ask that your food be prepared with less salt or no salt, if possible. What foods are recommended? The items listed may not be a complete list. Talk with your dietitian about what dietary choices are best for you. Grains Whole-grain or whole-wheat bread. Whole-grain or whole-wheat pasta. Brown rice. [place]. Bulgur. Whole-grain and low-sodium cereals.  Pita bread. Low-fat, low-sodium crackers. Whole-wheat flour tortillas. Vegetables Fresh or frozen vegetables (raw, steamed, roasted, or grilled). Low-sodium or reduced-sodium tomato and vegetable juice. Low-sodium or reduced-sodium tomato sauce and tomato paste. Low-sodium or reduced-sodium canned vegetables. Fruits All fresh, dried, or frozen fruit. Canned fruit in natural juice (without added sugar). Meat and other protein foods Skinless chicken or [place]. Ground chicken or [place]. Pork with fat trimmed off. Fish and seafood. Egg whites. Dried beans, peas, or lentils. Unsalted nuts, nut butters, and seeds. Unsalted canned beans. Lean cuts of beef with fat trimmed off. Low-sodium, lean deli meat. Dairy Low-fat (1%) or fat-free (skim) milk. Fat-free, low-fat, or reduced-fat cheeses. Nonfat, low-sodium ricotta or cottage cheese. Low-fat or nonfat yogurt. Low-fat, low-sodium cheese. Fats and oils Soft margarine without trans fats. Vegetable oil. Low-fat, reduced-fat, or light mayonnaise and salad dressings (reduced-sodium). Canola, safflower, olive, soybean, and sunflower oils. Avocado. Seasoning and other foods Herbs. Spices. Seasoning mixes without salt. Unsalted popcorn and pretzels. Fat-free sweets. What foods are not recommended? The items listed may not be a complete list. Talk with your dietitian about what dietary choices are best for you. Grains Baked goods made with fat, such as croissants, muffins, or some breads. Dry pasta or rice meal packs. Vegetables Creamed or fried vegetables. Vegetables in a cheese sauce. Regular canned vegetables (not low-sodium or reduced-sodium). Regular canned tomato sauce and paste (not low-sodium or reduced-sodium). Regular tomato and vegetable juice (not low-sodium or reduced-sodium). Angie Fava. Olives. Fruits Canned fruit in a light or heavy syrup. Fried fruit. Fruit in cream or butter sauce. Meat and other protein foods Fatty cuts of meat. Ribs. Fried  meat. Berniece Salines. Sausage. Bologna and other processed lunch meats. Salami. Fatback. Hotdogs. Bratwurst. Salted nuts and seeds. Canned beans with added salt. Canned or smoked fish. Whole eggs or egg yolks. Chicken or [place] with skin. Dairy Whole or 2% milk, cream, and half-and-half. Whole or full-fat cream cheese. Whole-fat or sweetened yogurt. Full-fat cheese. Nondairy creamers. Whipped toppings. Processed cheese and cheese spreads. Fats and oils Butter. Stick margarine. Lard. Shortening. Ghee. Bacon fat. Tropical oils, such as coconut, palm kernel, or palm oil. Seasoning and other foods Salted popcorn and pretzels. Onion salt, garlic salt, seasoned salt, table salt, and sea salt. Worcestershire sauce. Tartar sauce. Barbecue sauce. Teriyaki sauce. Soy sauce, including reduced-sodium. Steak sauce. Canned and packaged gravies. Fish sauce. Oyster sauce. Cocktail sauce. Horseradish that you find on the shelf. Ketchup. Mustard. Meat flavorings and tenderizers. Bouillon cubes. Hot sauce and Tabasco sauce. Premade or packaged marinades. Premade or packaged taco seasonings. Relishes. Regular salad dressings. Where to find more information:  National Heart, Lung, and [place]: https://wilson-eaton.com/  American Heart Association: www.heart.org Summary  The DASH eating plan is a healthy eating plan that has been shown to reduce high blood pressure (hypertension). It may also reduce your risk for type 2 diabetes, heart disease, and stroke.  With the DASH eating plan, you should  limit salt (sodium) intake to 2,300 mg a day. If you have hypertension, you may need to reduce your sodium intake to 1,500 mg a day.  When on the DASH eating plan, aim to eat more fresh fruits and vegetables, whole grains, lean proteins, low-fat dairy, and heart-healthy fats.  Work with your health care provider or diet and nutrition specialist (dietitian) to adjust your eating plan to your individual calorie needs. This information is  not intended to replace advice given to you by your health care provider. Make sure you discuss any questions you have with your health care provider. Document Revised: 12/25/2016 Document Reviewed: 01/06/2016 Elsevier Patient Education  [place] and Cholesterol Restricted Eating Plan Getting too much fat and cholesterol in your diet may cause health problems. Choosing the right foods helps keep your fat and cholesterol at normal levels. This can keep you from getting certain diseases. What are tips for following this plan? Meal planning  At meals, divide your plate into four equal parts: ? Fill one-half of your plate with vegetables and green salads. ? Fill one-fourth of your plate with whole grains. ? Fill one-fourth of your plate with low-fat (lean) protein foods.  Eat fish that is high in omega-3 fats at least two times a week. This includes mackerel, tuna, sardines, and salmon.  Eat foods that are high in fiber, such as whole grains, beans, apples, broccoli, carrots, peas, and barley. General tips   Work with your doctor to lose weight if you need to.  Avoid: ? Foods with added sugar. ? Fried foods. ? Foods with partially hydrogenated oils.  Limit alcohol intake to no more than 1 drink a day for nonpregnant women and 2 drinks a day for men. One drink equals 12 oz of beer, 5 oz of wine, or 1 oz of hard liquor. Reading food labels  Check food labels for: ? Trans fats. ? Partially hydrogenated oils. ? Saturated fat (g) in each serving. ? Cholesterol (mg) in each serving. ? Fiber (g) in each serving.  Choose foods with healthy fats, such as: ? Monounsaturated fats. ? Polyunsaturated fats. ? Omega-3 fats.  Choose grain products that have whole grains. Look for the word "whole" as the first word in the ingredient list. Cooking  Cook foods using low-fat methods. These include baking, boiling, grilling, and broiling.  Eat more home-cooked foods. Eat at  restaurants and buffets less often.  Avoid cooking using saturated fats, such as butter, cream, palm oil, palm kernel oil, and coconut oil. Recommended foods  Fruits  All fresh, canned (in natural juice), or frozen fruits. Vegetables  Fresh or frozen vegetables (raw, steamed, roasted, or grilled). Green salads. Grains  Whole grains, such as whole wheat or whole grain breads, crackers, cereals, and pasta. Unsweetened oatmeal, bulgur, barley, quinoa, or brown rice. Corn or whole wheat flour tortillas. Meats and other protein foods  Ground beef (85% or leaner), grass-fed beef, or beef trimmed of fat. Skinless chicken or [place]. Ground chicken or [place]. Pork trimmed of fat. All fish and seafood. Egg whites. Dried beans, peas, or lentils. Unsalted nuts or seeds. Unsalted canned beans. Nut butters without added sugar or oil. Dairy  Low-fat or nonfat dairy products, such as skim or 1% milk, 2% or reduced-fat cheeses, low-fat and fat-free ricotta or cottage cheese, or plain low-fat and nonfat yogurt. Fats and oils  Tub margarine without trans fats. Light or reduced-fat mayonnaise and salad dressings. Avocado. Olive, canola, sesame, or safflower oils.  The items listed above may not be a complete list of foods and beverages you can eat. Contact a dietitian for more information. Foods to avoid Fruits  Canned fruit in heavy syrup. Fruit in cream or butter sauce. Fried fruit. Vegetables  Vegetables cooked in cheese, cream, or butter sauce. Fried vegetables. Grains  White bread. White pasta. White rice. Cornbread. Bagels, pastries, and croissants. Crackers and snack foods that contain trans fat and hydrogenated oils. Meats and other protein foods  Fatty cuts of meat. Ribs, chicken wings, bacon, sausage, bologna, salami, chitterlings, fatback, hot dogs, bratwurst, and packaged lunch meats. Liver and organ meats. Whole eggs and egg yolks. Chicken and [place] with skin. Fried meat. Dairy  Whole  or 2% milk, cream, half-and-half, and cream cheese. Whole milk cheeses. Whole-fat or sweetened yogurt. Full-fat cheeses. Nondairy creamers and whipped toppings. Processed cheese, cheese spreads, and cheese curds. Beverages  Alcohol. Sugar-sweetened drinks such as sodas, lemonade, and fruit drinks. Fats and oils  Butter, stick margarine, lard, shortening, ghee, or bacon fat. Coconut, palm kernel, and palm oils. Sweets and desserts  Corn syrup, sugars, honey, and molasses. Candy. Jam and jelly. Syrup. Sweetened cereals. Cookies, pies, cakes, donuts, muffins, and ice cream. The items listed above may not be a complete list of foods and beverages you should avoid. Contact a dietitian for more information. Summary  Choosing the right foods helps keep your fat and cholesterol at normal levels. This can keep you from getting certain diseases.  At meals, fill one-half of your plate with vegetables and green salads.  Eat high-fiber foods, like whole grains, beans, apples, carrots, peas, and barley.  Limit added sugar, saturated fats, alcohol, and fried foods. This information is not intended to replace advice given to you by your health care provider. Make sure you discuss any questions you have with your health care provider. Document Revised: 09/15/2017 Document Reviewed: 09/29/2016 Elsevier Patient Education  [place].

## 2019-05-11 LAB — CBC WITH DIFFERENTIAL/PLATELET
Basophils Absolute: 0.1 10*3/uL (ref 0.0–0.2)
Basos: 1 %
EOS (ABSOLUTE): 0.1 10*3/uL (ref 0.0–0.4)
Eos: 2 %
Hematocrit: 39.2 % (ref 34.0–46.6)
Hemoglobin: 13.2 g/dL (ref 11.1–15.9)
Immature Grans (Abs): 0 10*3/uL (ref 0.0–0.1)
Immature Granulocytes: 0 %
Lymphocytes Absolute: 2.6 10*3/uL (ref 0.7–3.1)
Lymphs: 30 %
MCH: 31.3 pg (ref 26.6–33.0)
MCHC: 33.7 g/dL (ref 31.5–35.7)
MCV: 93 fL (ref 79–97)
Monocytes Absolute: 0.6 10*3/uL (ref 0.1–0.9)
Monocytes: 7 %
Neutrophils Absolute: 5.1 10*3/uL (ref 1.4–7.0)
Neutrophils: 60 %
Platelets: 221 10*3/uL (ref 150–450)
RBC: 4.22 x10E6/uL (ref 3.77–5.28)
RDW: 13.1 % (ref 11.7–15.4)
WBC: 8.6 10*3/uL (ref 3.4–10.8)

## 2019-05-11 LAB — COMPREHENSIVE METABOLIC PANEL
ALT: 42 IU/L — ABNORMAL HIGH (ref 0–32)
AST: 42 IU/L — ABNORMAL HIGH (ref 0–40)
Albumin/Globulin Ratio: 1.8 (ref 1.2–2.2)
Albumin: 4.4 g/dL (ref 3.8–4.8)
Alkaline Phosphatase: 96 IU/L (ref 39–117)
BUN/Creatinine Ratio: 27 (ref 12–28)
BUN: 27 mg/dL (ref 8–27)
Bilirubin Total: <0.2 mg/dL (ref 0.0–1.2)
CO2: 23 mmol/L (ref 20–29)
Calcium: 9.5 mg/dL (ref 8.7–10.3)
Chloride: 102 mmol/L (ref 96–106)
Creatinine, Ser: 0.99 mg/dL (ref 0.57–1.00)
GFR calc Af Amer: 69 mL/min/{1.73_m2} (ref >59)
GFR calc non Af Amer: 60 mL/min/{1.73_m2} (ref >59)
Globulin, Total: 2.4 g/dL (ref 1.5–4.5)
Glucose: 114 mg/dL — ABNORMAL HIGH (ref 65–99)
Potassium: 4.4 mmol/L (ref 3.5–5.2)
Sodium: 141 mmol/L (ref 134–144)
Total Protein: 6.8 g/dL (ref 6.0–8.5)

## 2019-05-11 LAB — LIPID PANEL
Chol/HDL Ratio: 2.9 ratio (ref 0.0–4.4)
Cholesterol, Total: 114 mg/dL (ref 100–199)
HDL: 39 mg/dL — ABNORMAL LOW (ref >39)
LDL Chol Calc (NIH): 51 mg/dL (ref 0–99)
Triglycerides: 134 mg/dL (ref 0–149)
VLDL Cholesterol Cal: 24 mg/dL (ref 5–40)

## 2019-05-11 LAB — CARDIOVASCULAR RISK ASSESSMENT

## 2019-05-11 LAB — TSH: TSH: 3.13 u[IU]/mL (ref 0.450–4.500)

## 2019-05-11 LAB — FERRITIN: Ferritin: 190 ng/mL — ABNORMAL HIGH (ref 15–150)

## 2019-05-14 ENCOUNTER — Encounter: Payer: Self-pay | Admitting: Family Medicine

## 2019-05-14 DIAGNOSIS — Z6841 Body Mass Index (BMI) 40.0 and over, adult: Secondary | ICD-10-CM | POA: Insufficient documentation

## 2019-05-14 HISTORY — DX: Morbid (severe) obesity due to excess calories: E66.01

## 2019-05-20 ENCOUNTER — Other Ambulatory Visit: Payer: Self-pay | Admitting: Family Medicine

## 2019-07-01 ENCOUNTER — Other Ambulatory Visit: Payer: Self-pay | Admitting: Family Medicine

## 2019-07-20 ENCOUNTER — Other Ambulatory Visit: Payer: Self-pay

## 2019-07-20 MED ORDER — EDARBI 80 MG PO TABS: 1.0000 | ORAL_TABLET | Freq: Every day | ORAL | 0 refills | Status: DC

## 2019-08-10 ENCOUNTER — Encounter: Payer: Self-pay | Admitting: Family Medicine

## 2019-08-10 ENCOUNTER — Other Ambulatory Visit: Payer: Self-pay

## 2019-08-10 ENCOUNTER — Ambulatory Visit (INDEPENDENT_AMBULATORY_CARE_PROVIDER_SITE_OTHER): Payer: PPO | Admitting: Family Medicine

## 2019-08-10 VITALS — BP 116/74 | HR 74 | Temp 97.6°F | Ht 62.0 in | Wt 272.0 lb

## 2019-08-10 DIAGNOSIS — I119 Hypertensive heart disease without heart failure: Secondary | ICD-10-CM | POA: Diagnosis not present

## 2019-08-10 DIAGNOSIS — E782 Mixed hyperlipidemia: Secondary | ICD-10-CM

## 2019-08-10 DIAGNOSIS — J45909 Unspecified asthma, uncomplicated: Secondary | ICD-10-CM

## 2019-08-10 DIAGNOSIS — I10 Essential (primary) hypertension: Secondary | ICD-10-CM | POA: Diagnosis not present

## 2019-08-10 DIAGNOSIS — K219 Gastro-esophageal reflux disease without esophagitis: Secondary | ICD-10-CM | POA: Diagnosis not present

## 2019-08-10 DIAGNOSIS — R7301 Impaired fasting glucose: Secondary | ICD-10-CM

## 2019-08-10 HISTORY — DX: Unspecified asthma, uncomplicated: J45.909

## 2019-08-10 LAB — BASIC METABOLIC PANEL
BUN/Creatinine Ratio: 19 (ref 12–28)
BUN: 25 mg/dL (ref 8–27)
CO2: 25 mmol/L (ref 20–29)
Calcium: 10 mg/dL (ref 8.7–10.3)
Chloride: 99 mmol/L (ref 96–106)
Creatinine, Ser: 1.3 mg/dL — ABNORMAL HIGH (ref 0.57–1.00)
GFR calc Af Amer: 50 mL/min/{1.73_m2} — ABNORMAL LOW (ref >59)
GFR calc non Af Amer: 43 mL/min/{1.73_m2} — ABNORMAL LOW (ref >59)
Glucose: 112 mg/dL — ABNORMAL HIGH (ref 65–99)
Potassium: 4.4 mmol/L (ref 3.5–5.2)
Sodium: 138 mmol/L (ref 134–144)

## 2019-08-10 LAB — HEMOGLOBIN A1C
Est. average glucose Bld gHb Est-mCnc: 126 mg/dL
Hgb A1c MFr Bld: 6 % — ABNORMAL HIGH (ref 4.8–5.6)

## 2019-08-10 MED ORDER — ALBUTEROL SULFATE 108 (90 BASE) MCG/ACT IN AEPB: 2.0000 | INHALATION_SPRAY | RESPIRATORY_TRACT | 2 refills | Status: DC | PRN

## 2019-08-10 NOTE — Progress Notes (Signed)
Established Patient Office Visit  Subjective:  Patient ID: Cheryl Beard, female    DOB: 27-Mar-1955  Age: 64 y.o. MRN: [id number]  CC:  Chief Complaint  Patient presents with  . Hypertension  . Hyperlipidemia    HPI Pt presents with hyperlipidemia.  Current treatment includes Crestor.  Compliance with treatment has been good; she takes her medication as directed, maintains her low cholesterol diet, and follows up as directed.  She denies experiencing any hypercholesterolemia related symptoms.      Cheryl Beard presents with a diagnosis of impaired fasting glucose.  The course has been stable and nonprogressive.      Pt presents for follow up of hypertension.  Her current cardiac medication regimen includes a diuretic ( chlorthalidone ), a beta-blocker ( toprol xl ), and an angiotensin receptor blocker Earnest Rosier ).  She is tolerating the medication well without side effects.  Compliance with treatment has been good; she takes her medication as directed, Hypertension associated with history of myocardial infarction. Pt states little exercise as husband currently admitted to the hospital with concern for CAD      Cheryl Beard presents with a diagnosis of other asthma.  The course has been stable and nonprogressive.  Using Symbicort 2 puffs one to two times per day as needed. Pt has been using albuterol due to heat as a trigger  GERD-uses Protonix daily Gout-no flares with allopurinol    In regard to the atherosclerotic heart disease of native coronary artery without angina pectoris, she is here today for routine follow-up.  Noncritical. taking statin and metoprolol.     Past Medical History:  Diagnosis Date  . Allergy   . CAD (coronary artery disease)   . Depression   . Erosive esophagitis   . Erosive gastritis   . Gallbladder disease   . GERD (gastroesophageal reflux disease)   . History of Clostridium difficile infection   . History of COVID-19   . History of myocardial infarction     . Hyperlipidemia   . Hypertension   . Hypertensive urgency   . Idiopathic gout   . Myocardial infarction ([place])   . Nontoxic multinodular goiter   . Other asthma   . Vitamin D deficiency     Past Surgical History:  Procedure Laterality Date  . ABDOMINAL HYSTERECTOMY    . APPENDECTOMY    . CHOLECYSTECTOMY    . ROTATOR CUFF REPAIR      Family History  Problem Relation Age of Onset  . Cancer Mother   . Parkinson's disease Mother   . Diabetes Mother   . Hypertension Mother   . Stroke Father   . Hyperlipidemia Sister   . Hypertension Sister   . Alzheimer's disease Maternal Grandmother   . Fibromyalgia Sister   . Cancer Sister   . Cancer Paternal Grandmother        Leukemia    Social History   Socioeconomic History  . Marital status: Married    Spouse name: Not on file  . Number of children: 3  . Years of education: Not on file  . Highest education level: Not on file  Occupational History  . Not on file  Tobacco Use  . Smoking status: Former Research scientist (life sciences)  . Smokeless tobacco: Never Used  Substance and Sexual Activity  . Alcohol use: Never  . Drug use: Never  . Sexual activity: Not on file  Other Topics Concern  . Not on file  Social History Narrative  . Not on file  Social Determinants of Health   Financial Resource Strain:   . Difficulty of Paying Living Expenses:   Food Insecurity:   . Worried About Charity fundraiser in the Last Year:   . Arboriculturist in the Last Year:   Transportation Needs:   . Film/video editor (Medical):   [name] Lack of Transportation (Non-Medical):   Physical Activity:   . Days of Exercise per Week:   . Minutes of Exercise per Session:   Stress:   . Feeling of Stress :   Social Connections:   . Frequency of Communication with Friends and Family:   . Frequency of Social Gatherings with Friends and Family:   . Attends Religious Services:   . Active Member of Clubs or Organizations:   . Attends Archivist Meetings:    [name] Marital Status:   Intimate Partner Violence:   . Fear of Current or Ex-Partner:   . Emotionally Abused:   [name] Physically Abused:   . Sexually Abused:     Outpatient Medications Prior to Visit  Medication Sig Dispense Refill  . Albuterol Sulfate 108 (90 Base) MCG/ACT AEPB Inhale 2 puffs into the lungs as needed (for wheezing).    [name] allopurinol (ZYLOPRIM) 100 MG tablet Take 1 tablet (100 mg total) by mouth daily. 90 tablet 1  . aspirin EC 81 MG tablet Take 81 mg by mouth daily.    . calcium carbonate (TUMS - DOSED IN MG ELEMENTAL CALCIUM) 500 MG chewable tablet Chew 1 tablet by mouth 2 (two) times daily.    . chlorthalidone (HYGROTON) 50 MG tablet TAKE 1 TABLET(50 MG) BY MOUTH DAILY 90 tablet 0  . cholecalciferol (VITAMIN D3) 25 MCG (1000 UNIT) tablet Take 1,000 Units by mouth daily.    [name] EDARBI 80 MG TABS Take 1 tablet (80 mg total) by mouth daily. 90 tablet 0  . metoprolol succinate (TOPROL-XL) 50 MG 24 hr tablet TAKE 1/2 TABLET BY MOUTH EVERY DAY 45 tablet 0  . Multiple Vitamins-Minerals (WOMENS MULTIVITAMIN PLUS PO) Take 1 tablet by mouth every morning.    . Omega-3 Fatty Acids (FISH OIL) 1000 MG CAPS Take 2 capsules by mouth daily.    . pantoprazole (PROTONIX) 40 MG tablet TAKE 1 TABLET BY MOUTH DAILY 90 tablet 3  . rosuvastatin (CRESTOR) 20 MG tablet TAKE 1 TABLET BY MOUTH EVERY DAY AT NIGHT 90 tablet 0   No facility-administered medications prior to visit.    Allergies  Allergen Reactions  . Candesartan   . Clarithromycin   . Colesevelam   . Duloxetine   . Pregabalin     ROS Review of Systems  Constitutional: Positive for fatigue. Negative for chills, diaphoresis and fever.  HENT: Negative for congestion, ear pain, rhinorrhea and sore throat.        Ears itch. PND.  Respiratory: Negative for cough and shortness of breath.   Cardiovascular: Positive for leg swelling. Negative for chest pain.  Gastrointestinal: Negative for abdominal pain, constipation, diarrhea,  nausea and vomiting.       Indigestion about 3 times a a week. Awakens her in the middle of the night. Taking pantoprazole, not sure if is helping.  Patient has been on prilosec, which did not work.   Genitourinary: Negative for dysuria and urgency.  Musculoskeletal: Positive for arthralgias. Negative for back pain and myalgias.       Knee pain she thinks due to weight gain. Not exercising since covid 19 in December. She is eating  healthy.  No gout flare up for several months and then had a gout flare up in her left toe. Did not take colchicine. Patient has been on allopurinol for more than 3 months.  Skin:       Hair falling out since December and had Covid 19.  Neurological: Negative for dizziness, weakness, light-headedness and headaches.  Psychiatric/Behavioral: Negative for dysphoric mood. The patient is nervous/anxious.       Objective:    Physical Exam Constitutional:      Appearance: Normal appearance. She is well-developed.     Comments: Obese.  HENT:     Head: Normocephalic.     Right Ear: External ear normal.     Left Ear: External ear normal.     Mouth/Throat:     Pharynx: No oropharyngeal exudate.  Cardiovascular:     Rate and Rhythm: Normal rate and regular rhythm.     Heart sounds: Normal heart sounds.  Pulmonary:     Effort: Pulmonary effort is normal. No respiratory distress.     Breath sounds: Normal breath sounds.  Abdominal:     Tenderness: There is no abdominal tenderness.  Musculoskeletal:     Right lower leg: Edema present.     Left lower leg: Edema present.  Skin:    Comments: Hair thinning.  Neurological:     Mental Status: She is alert and oriented to person, place, and time.  Psychiatric:        Mood and Affect: Mood normal.        Behavior: Behavior normal.     Temp 97.6 F (36.4 C)   Ht 5\' 2"  (1.575 m)   Wt 272 lb (123.4 kg)   BMI 49.75 kg/m  Wt Readings from Last 3 Encounters:  08/10/19 272 lb (123.4 kg)  05/10/19 272 lb (123.4 kg)      Health Maintenance Due  Topic Date Due  . Hepatitis C Screening  Never done  . COVID-19 Vaccine (1) Never done  . HIV Screening  Never done  . TETANUS/TDAP  Never done  . PAP SMEAR-Modifier  Never done  . MAMMOGRAM  Never done  . COLONOSCOPY  Never done  . DEXA SCAN  Never done  . PNA vac Low Risk Adult (1 of 2 - PCV13) Never done     Lab Results  Component Value Date   TSH 3.130 05/10/2019   Lab Results  Component Value Date   WBC 8.6 05/10/2019   HGB 13.2 05/10/2019   HCT 39.2 05/10/2019   MCV 93 05/10/2019   PLT 221 05/10/2019   Lab Results  Component Value Date   NA 141 05/10/2019   K 4.4 05/10/2019   CO2 23 05/10/2019   GLUCOSE 114 (H) 05/10/2019   BUN 27 05/10/2019   CREATININE 0.99 05/10/2019   BILITOT <0.2 05/10/2019   ALKPHOS 96 05/10/2019   AST 42 (H) 05/10/2019   ALT 42 (H) 05/10/2019   PROT 6.8 05/10/2019   ALBUMIN 4.4 05/10/2019   CALCIUM 9.5 05/10/2019   Lab Results  Component Value Date   CHOL 114 05/10/2019   Lab Results  Component Value Date   HDL 39 (L) 05/10/2019   Lab Results  Component Value Date   LDLCALC 51 05/10/2019   Lab Results  Component Value Date   TRIG 134 05/10/2019   Lab Results  Component Value Date   CHOLHDL 2.9 05/10/2019     Assessment & Plan:  1. Mixed hyperlipidemia Well controlled.  No changes  to medicines.  Continue to work on eating a healthy diet and exercise.  Reviewed labwork   2. Hypertensive heart disease without heart failure Well controlled.  No changes to medicines.  Continue to work on eating a healthy diet and exercise.  Labs drawn today.  BMP  3. Nontoxic multinodular goiter - TSH-3.130  4. 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 No flares   5. GERD without esophagitis protonix-rx continue-use at night for improved effectiveness  6. Impaired fasting glucose A1c   7. Asthma-albuterol-rx,   Follow-up:  3 months

## 2019-08-22 ENCOUNTER — Other Ambulatory Visit: Payer: Self-pay | Admitting: Family Medicine

## 2019-08-25 ENCOUNTER — Other Ambulatory Visit: Payer: Self-pay | Admitting: Family Medicine

## 2019-09-14 ENCOUNTER — Encounter: Payer: Self-pay | Admitting: Family Medicine

## 2019-09-14 ENCOUNTER — Ambulatory Visit (INDEPENDENT_AMBULATORY_CARE_PROVIDER_SITE_OTHER): Payer: PPO | Admitting: Family Medicine

## 2019-09-14 ENCOUNTER — Other Ambulatory Visit: Payer: Self-pay

## 2019-09-14 VITALS — BP 106/68 | HR 76 | Temp 97.5°F | Resp 18 | Ht 62.0 in | Wt 264.0 lb

## 2019-09-14 DIAGNOSIS — K0889 Other specified disorders of teeth and supporting structures: Secondary | ICD-10-CM | POA: Diagnosis not present

## 2019-09-14 DIAGNOSIS — J320 Chronic maxillary sinusitis: Secondary | ICD-10-CM | POA: Diagnosis not present

## 2019-09-14 MED ORDER — AMITRIPTYLINE HCL 25 MG PO TABS: 25.0000 mg | ORAL_TABLET | Freq: Every day | ORAL | 1 refills | Status: DC

## 2019-09-14 MED ORDER — AMOXICILLIN 875 MG PO TABS
875.0000 mg | ORAL_TABLET | Freq: Two times a day (BID) | ORAL | 0 refills | Status: DC
Start: 2019-09-14 — End: 2019-11-20

## 2019-09-14 NOTE — Progress Notes (Signed)
Acute Office Visit  Subjective:    Patient ID: Cheryl Beard, female    DOB: [birth year], 64 y.o.   MRN: [id number]  Chief Complaint  Patient presents with  . Dental Pain    HPI Patient is in today for left sided facial pain and toothache. She has seen the dentist ([facility]) and has no cavity, infections, or gum disease. Makes her left eye water when it hurts. Hurting since covid 19 in 12/2018.  Has not had any antibiotics since 12/2019.   Past Medical History:  Diagnosis Date  . Allergy   . CAD (coronary artery disease)   . Depression   . Erosive esophagitis   . Erosive gastritis   . Gallbladder disease   . GERD (gastroesophageal reflux disease)   . History of Clostridium difficile infection   . History of COVID-19   . History of myocardial infarction   . Hyperlipidemia   . Hypertension   . Hypertensive urgency   . Idiopathic gout   . Myocardial infarction ([place])   . Nontoxic multinodular goiter   . Other asthma   . Vitamin D deficiency     Past Surgical History:  Procedure Laterality Date  . ABDOMINAL HYSTERECTOMY    . APPENDECTOMY    . CHOLECYSTECTOMY    . ROTATOR CUFF REPAIR      Family History  Problem Relation Age of Onset  . Cancer Mother   . Parkinson's disease Mother   . Diabetes Mother   . Hypertension Mother   . Stroke Father   . Hyperlipidemia Sister   . Hypertension Sister   . Alzheimer's disease Maternal Grandmother   . Fibromyalgia Sister   . Cancer Sister   . Cancer Paternal Grandmother        Leukemia    Social History   Socioeconomic History  . Marital status: Married    Spouse name: Not on file  . Number of children: 3  . Years of education: Not on file  . Highest education level: Not on file  Occupational History  . Not on file  Tobacco Use  . Smoking status: Former Smoker    Quit date: 2002    Years since quitting: 19.6  . Smokeless tobacco: Never Used  Substance and Sexual Activity  . Alcohol use: Never    . Drug use: Never  . Sexual activity: Not on file  Other Topics Concern  . Not on file  Social History Narrative  . Not on file   Social Determinants of Health   Financial Resource Strain:   . Difficulty of Paying Living Expenses: Not on file  Food Insecurity:   . Worried About Charity fundraiser in the Last Year: Not on file  . Ran Out of Food in the Last Year: Not on file  Transportation Needs:   . Lack of Transportation (Medical): Not on file  . Lack of Transportation (Non-Medical): Not on file  Physical Activity:   . Days of Exercise per Week: Not on file  . Minutes of Exercise per Session: Not on file  Stress:   . Feeling of Stress : Not on file  Social Connections:   . Frequency of Communication with Friends and Family: Not on file  . Frequency of Social Gatherings with Friends and Family: Not on file  . Attends Religious Services: Not on file  . Active Member of Clubs or Organizations: Not on file  . Attends Archivist Meetings: Not on file  .  Marital Status: Not on file  Intimate Partner Violence:   . Fear of Current or Ex-Partner: Not on file  . Emotionally Abused: Not on file  . Physically Abused: Not on file  . Sexually Abused: Not on file    Outpatient Medications Prior to Visit  Medication Sig Dispense Refill  . Albuterol Sulfate (PROAIR RESPICLICK) 277 (90 Base) MCG/ACT AEPB Inhale 2 puffs into the lungs as needed (for wheezing). 1 each 2  . allopurinol (ZYLOPRIM) 100 MG tablet Take 1 tablet (100 mg total) by mouth daily. 90 tablet 1  . aspirin EC 81 MG tablet Take 81 mg by mouth daily.    . calcium carbonate (TUMS - DOSED IN MG ELEMENTAL CALCIUM) 500 MG chewable tablet Chew 1 tablet by mouth 2 (two) times daily.    . chlorthalidone (HYGROTON) 50 MG tablet TAKE 1 TABLET(50 MG) BY MOUTH DAILY 90 tablet 0  . cholecalciferol (VITAMIN D3) 25 MCG (1000 UNIT) tablet Take 1,000 Units by mouth daily.    . colchicine 0.6 MG tablet Take 0.6 mg by mouth  daily.    [name] EDARBI 80 MG TABS Take 1 tablet (80 mg total) by mouth daily. 90 tablet 0  . metoprolol succinate (TOPROL-XL) 50 MG 24 hr tablet TAKE 1/2 TABLET BY MOUTH EVERY DAY 45 tablet 0  . Multiple Vitamins-Minerals (WOMENS MULTIVITAMIN PLUS PO) Take 1 tablet by mouth every morning.    . Omega-3 Fatty Acids (FISH OIL) 1000 MG CAPS Take 2 capsules by mouth daily.    . pantoprazole (PROTONIX) 40 MG tablet TAKE 1 TABLET BY MOUTH DAILY 90 tablet 3  . rosuvastatin (CRESTOR) 20 MG tablet TAKE 1 TABLET BY MOUTH EVERY DAY AT NIGHT 90 tablet 0   No facility-administered medications prior to visit.    Allergies  Allergen Reactions  . Candesartan   . Clarithromycin   . Colesevelam   . Duloxetine   . Pregabalin     Review of Systems  Constitutional: Positive for fatigue. Negative for chills and fever.  HENT: Positive for congestion. Negative for ear discharge, rhinorrhea and sore throat.        Left sided toothache  Respiratory: Negative for cough and shortness of breath.   Cardiovascular: Negative for chest pain and palpitations.  Gastrointestinal: Negative for abdominal pain, constipation and diarrhea.  Neurological: Negative for weakness and headaches.       Objective:    Physical Exam Vitals reviewed.  Constitutional:      Appearance: Normal appearance.  HENT:     Right Ear: Tympanic membrane normal.     Left Ear: Tympanic membrane normal.     Nose: Nose normal.     Mouth/Throat:     Mouth: Mucous membranes are moist.  Cardiovascular:     Rate and Rhythm: Normal rate and regular rhythm.     Heart sounds: Normal heart sounds.  Pulmonary:     Effort: Pulmonary effort is normal. No respiratory distress.     Breath sounds: Normal breath sounds.  Neurological:     Mental Status: She is alert.     BP 106/68   Pulse 76   Temp (!) 97.5 F (36.4 C)   Resp 18   Ht 5\' 2"  (1.575 m)   Wt 264 lb (119.7 kg)   BMI 48.29 kg/m  Wt Readings from Last 3 Encounters:  09/14/19 264  lb (119.7 kg)  08/10/19 272 lb (123.4 kg)  05/10/19 272 lb (123.4 kg)    Health Maintenance Due  Topic Date Due  . Hepatitis C Screening  Never done  . HIV Screening  Never done  . TETANUS/TDAP  Never done  . PAP SMEAR-Modifier  Never done  . MAMMOGRAM  Never done  . COLONOSCOPY  Never done  . DEXA SCAN  Never done  . PNA vac Low Risk Adult (1 of 2 - PCV13) Never done  . INFLUENZA VACCINE  08/27/2019    There are no preventive care reminders to display for this patient.   Lab Results  Component Value Date   TSH 3.130 05/10/2019   Lab Results  Component Value Date   WBC 8.6 05/10/2019   HGB 13.2 05/10/2019   HCT 39.2 05/10/2019   MCV 93 05/10/2019   PLT 221 05/10/2019   Lab Results  Component Value Date   NA 138 08/10/2019   K 4.4 08/10/2019   CO2 25 08/10/2019   GLUCOSE 112 (H) 08/10/2019   BUN 25 08/10/2019   CREATININE 1.30 (H) 08/10/2019   BILITOT <0.2 05/10/2019   ALKPHOS 96 05/10/2019   AST 42 (H) 05/10/2019   ALT 42 (H) 05/10/2019   PROT 6.8 05/10/2019   ALBUMIN 4.4 05/10/2019   CALCIUM 10.0 08/10/2019   Lab Results  Component Value Date   CHOL 114 05/10/2019   Lab Results  Component Value Date   HDL 39 (L) 05/10/2019   Lab Results  Component Value Date   LDLCALC 51 05/10/2019   Lab Results  Component Value Date   TRIG 134 05/10/2019   Lab Results  Component Value Date   CHOLHDL 2.9 05/10/2019   Lab Results  Component Value Date   HGBA1C 6.0 (H) 08/10/2019       Assessment & Plan:  1. Tooth pain  Start amitriptyline 25 mg once daily at bedtime  2. Maxillary sinusitis, unspecified chronicity  Amoxicillin rx given.   Meds ordered this encounter  Medications  . amoxicillin (AMOXIL) 875 MG tablet    Sig: Take 1 tablet (875 mg total) by mouth 2 (two) times daily.    Dispense:  20 tablet    Refill:  0  . amitriptyline (ELAVIL) 25 MG tablet    Sig: Take 1 tablet (25 mg total) by mouth at bedtime.    Dispense:  30 tablet     Refill:  1    No orders of the defined types were placed in this encounter.    Follow-up: Return if symptoms worsen or fail to improve.  An After Visit Summary was printed and given to the patient.  [name] [facility] [phone]

## 2019-09-18 ENCOUNTER — Encounter: Payer: Self-pay | Admitting: Family Medicine

## 2019-09-28 ENCOUNTER — Other Ambulatory Visit: Payer: Self-pay | Admitting: Physician Assistant

## 2019-09-30 ENCOUNTER — Other Ambulatory Visit: Payer: Self-pay | Admitting: Physician Assistant

## 2019-10-01 ENCOUNTER — Other Ambulatory Visit: Payer: Self-pay | Admitting: Family Medicine

## 2019-10-04 ENCOUNTER — Other Ambulatory Visit: Payer: Self-pay | Admitting: Family Medicine

## 2019-11-08 ENCOUNTER — Other Ambulatory Visit: Payer: Self-pay | Admitting: Family Medicine

## 2019-11-14 ENCOUNTER — Other Ambulatory Visit: Payer: Self-pay

## 2019-11-14 ENCOUNTER — Ambulatory Visit (INDEPENDENT_AMBULATORY_CARE_PROVIDER_SITE_OTHER): Payer: PPO | Admitting: Family Medicine

## 2019-11-14 ENCOUNTER — Encounter: Payer: Self-pay | Admitting: Family Medicine

## 2019-11-14 VITALS — BP 110/64 | HR 78 | Temp 98.1°F | Ht 62.0 in | Wt 270.2 lb

## 2019-11-14 DIAGNOSIS — E782 Mixed hyperlipidemia: Secondary | ICD-10-CM

## 2019-11-14 DIAGNOSIS — R7303 Prediabetes: Secondary | ICD-10-CM | POA: Diagnosis not present

## 2019-11-14 DIAGNOSIS — Z6841 Body Mass Index (BMI) 40.0 and over, adult: Secondary | ICD-10-CM | POA: Diagnosis not present

## 2019-11-14 DIAGNOSIS — I119 Hypertensive heart disease without heart failure: Secondary | ICD-10-CM | POA: Diagnosis not present

## 2019-11-14 DIAGNOSIS — K219 Gastro-esophageal reflux disease without esophagitis: Secondary | ICD-10-CM | POA: Diagnosis not present

## 2019-11-14 DIAGNOSIS — M797 Fibromyalgia: Secondary | ICD-10-CM

## 2019-11-14 DIAGNOSIS — Z23 Encounter for immunization: Secondary | ICD-10-CM

## 2019-11-14 MED ORDER — AMITRIPTYLINE HCL 25 MG PO TABS: ORAL_TABLET | ORAL | 3 refills | Status: DC

## 2019-11-14 NOTE — Progress Notes (Signed)
Established Patient Office Visit  Subjective:  Patient ID: Cheryl Beard, female    DOB: 03/25/1955  Age: 64 y.o. MRN: [id number]  CC:  Chief Complaint  Patient presents with  . Hypertension    Folow up    HPI Pt presents with hyperlipidemia.  Current treatment includes Crestor AND OTC FISH OIL.  Compliance with treatment has been good; she takes her medication as directed, maintains her low cholesterol diet, and follows up as directed.  She denies experiencing any hypercholesterolemia related symptoms.      Cheryl Beard presents with a diagnosis of impaired fasting glucose.  The course has been stable and nonprogressive.  Eating healthy.     Pt presents for follow up of hypertension.  Her current cardiac medication regimen includes a diuretic ( chlorthalidone ), a beta-blocker ( toprol xl ), and an angiotensin receptor blocker Earnest Rosier ).  She is tolerating the medication well without side effects.  Compliance with treatment has been good; she takes her medication as directed, Hypertension associated with history of myocardial infarction. Pt states little exercise as husband currently admitted to the hospital with concern for CAD      Cheryl Beard presents with a diagnosis of other asthma.  The course has been stable and nonprogressive.  Using Symbicort 2 puffs one to two times per day as needed. Pt has been using albuterol due to heat as a trigger  GERD-uses Protonix daily Gout-no flares with allopurinol    In regard to the atherosclerotic heart disease of native coronary artery without angina pectoris, she is here today for routine follow-up.  Noncritical. taking statin and metoprolol.     Past Medical History:  Diagnosis Date  . Allergy   . CAD (coronary artery disease)   . Depression   . Erosive esophagitis   . Erosive gastritis   . Gallbladder disease   . GERD (gastroesophageal reflux disease)   . History of Clostridium difficile infection   . History of COVID-19   . History  of myocardial infarction   . Hyperlipidemia   . Hypertension   . Hypertensive urgency   . Idiopathic gout   . Myocardial infarction ([place])   . Nontoxic multinodular goiter   . Other asthma   . Vitamin D deficiency     Past Surgical History:  Procedure Laterality Date  . ABDOMINAL HYSTERECTOMY    . APPENDECTOMY    . CHOLECYSTECTOMY    . ROTATOR CUFF REPAIR      Family History  Problem Relation Age of Onset  . Cancer Mother   . Parkinson's disease Mother   . Diabetes Mother   . Hypertension Mother   . Stroke Father   . Hyperlipidemia Sister   . Hypertension Sister   . Alzheimer's disease Maternal Grandmother   . Fibromyalgia Sister   . Cancer Sister   . Cancer Paternal Grandmother        Leukemia    Social History   Socioeconomic History  . Marital status: Married    Spouse name: Not on file  . Number of children: 3  . Years of education: Not on file  . Highest education level: Not on file  Occupational History  . Not on file  Tobacco Use  . Smoking status: Former Smoker    Quit date: 2002    Years since quitting: 19.8  . Smokeless tobacco: Never Used  Substance and Sexual Activity  . Alcohol use: Never  . Drug use: Never  . Sexual activity: Not  on file  Other Topics Concern  . Not on file  Social History Narrative  . Not on file   Social Determinants of Health   Financial Resource Strain:   . Difficulty of Paying Living Expenses: Not on file  Food Insecurity:   . Worried About Charity fundraiser in the Last Year: Not on file  . Ran Out of Food in the Last Year: Not on file  Transportation Needs:   . Lack of Transportation (Medical): Not on file  . Lack of Transportation (Non-Medical): Not on file  Physical Activity:   . Days of Exercise per Week: Not on file  . Minutes of Exercise per Session: Not on file  Stress:   . Feeling of Stress : Not on file  Social Connections:   . Frequency of Communication with Friends and Family: Not on file  .  Frequency of Social Gatherings with Friends and Family: Not on file  . Attends Religious Services: Not on file  . Active Member of Clubs or Organizations: Not on file  . Attends Archivist Meetings: Not on file  . Marital Status: Not on file  Intimate Partner Violence:   . Fear of Current or Ex-Partner: Not on file  . Emotionally Abused: Not on file  . Physically Abused: Not on file  . Sexually Abused: Not on file    Outpatient Medications Prior to Visit  Medication Sig Dispense Refill  . Albuterol Sulfate (PROAIR RESPICLICK) 836 (90 Base) MCG/ACT AEPB Inhale 2 puffs into the lungs as needed (for wheezing). 1 each 2  . allopurinol (ZYLOPRIM) 100 MG tablet TAKE 1 TABLET BY MOUTH ONCE DAILY 90 tablet 1  . amitriptyline (ELAVIL) 25 MG tablet TAKE 1 TABLET(25 MG) BY MOUTH AT BEDTIME 30 tablet 1  . amoxicillin (AMOXIL) 875 MG tablet Take 1 tablet (875 mg total) by mouth 2 (two) times daily. 20 tablet 0  . aspirin EC 81 MG tablet Take 81 mg by mouth daily.    . calcium carbonate (TUMS - DOSED IN MG ELEMENTAL CALCIUM) 500 MG chewable tablet Chew 1 tablet by mouth 2 (two) times daily.    . chlorthalidone (HYGROTON) 50 MG tablet TAKE 1 TABLET(50 MG) BY MOUTH DAILY 90 tablet 0  . cholecalciferol (VITAMIN D3) 25 MCG (1000 UNIT) tablet Take 1,000 Units by mouth daily.    . colchicine 0.6 MG tablet Take 0.6 mg by mouth daily.    [name] EDARBI 80 MG TABS Take 1 tablet (80 mg total) by mouth daily. 90 tablet 0  . metoprolol succinate (TOPROL-XL) 50 MG 24 hr tablet TAKE 1/2 TABLET BY MOUTH EVERY DAY 45 tablet 0  . Multiple Vitamins-Minerals (WOMENS MULTIVITAMIN PLUS PO) Take 1 tablet by mouth every morning.    . Omega-3 Fatty Acids (FISH OIL) 1000 MG CAPS Take 2 capsules by mouth daily.    . pantoprazole (PROTONIX) 40 MG tablet TAKE 1 TABLET BY MOUTH DAILY 90 tablet 3  . rosuvastatin (CRESTOR) 20 MG tablet TAKE 1 TABLET BY MOUTH EVERY DAY AT NIGHT 90 tablet 0   No facility-administered medications  prior to visit.    Allergies  Allergen Reactions  . Candesartan   . Clarithromycin   . Colesevelam   . Duloxetine   . Pregabalin     ROS Review of Systems  Constitutional: Positive for fatigue. Negative for chills, diaphoresis and fever.  HENT: Negative for congestion, ear pain, rhinorrhea and sore throat.   Respiratory: Negative for cough and shortness of  breath.   Cardiovascular: Negative for chest pain and leg swelling.  Gastrointestinal: Negative for abdominal pain, constipation, diarrhea, nausea and vomiting.  Endocrine: Negative for polydipsia and polyphagia.  Genitourinary: Negative for dysuria and urgency.  Musculoskeletal: Positive for arthralgias and myalgias. Negative for back pain.       No gout flare up for several months. Patient has been on allopurinol for more than 3 months.  Skin: Negative for rash.  Neurological: Negative for dizziness, weakness, light-headedness and headaches.  Psychiatric/Behavioral: Negative for dysphoric mood. The patient is not nervous/anxious.       Objective:    Physical Exam Constitutional:      Appearance: Normal appearance. She is well-developed.     Comments: Obese.  HENT:     Head: Normocephalic.     Right Ear: External ear normal.     Left Ear: External ear normal.     Mouth/Throat:     Pharynx: No oropharyngeal exudate.  Cardiovascular:     Rate and Rhythm: Normal rate and regular rhythm.     Heart sounds: Normal heart sounds.  Pulmonary:     Effort: Pulmonary effort is normal. No respiratory distress.     Breath sounds: Normal breath sounds.  Abdominal:     Tenderness: There is no abdominal tenderness.  Musculoskeletal:     Right lower leg: Edema present.     Left lower leg: Edema present.  Skin:    Comments: Hair thinning.  Neurological:     Mental Status: She is alert and oriented to person, place, and time.  Psychiatric:        Mood and Affect: Mood normal.        Behavior: Behavior normal.     BP  110/64 (BP Location: Right Arm, Patient Position: Sitting, Cuff Size: Large)   Pulse 78   Temp 98.1 F (36.7 C) (Temporal)   Ht 5\' 2"  (1.575 m)   Wt 270 lb 3.2 oz (122.6 kg)   SpO2 99%   BMI 49.42 kg/m  Wt Readings from Last 3 Encounters:  11/14/19 270 lb 3.2 oz (122.6 kg)  09/14/19 264 lb (119.7 kg)  08/10/19 272 lb (123.4 kg)     Health Maintenance Due  Topic Date Due  . Hepatitis C Screening  Never done  . TETANUS/TDAP  Never done  . MAMMOGRAM  Never done  . COLONOSCOPY  Never done  . DEXA SCAN  Never done  . PNA vac Low Risk Adult (1 of 2 - PCV13) Never done  . INFLUENZA VACCINE  08/27/2019   Lab Results  Component Value Date   TSH 3.130 05/10/2019   Lab Results  Component Value Date   WBC 8.6 05/10/2019   HGB 13.2 05/10/2019   HCT 39.2 05/10/2019   MCV 93 05/10/2019   PLT 221 05/10/2019   Lab Results  Component Value Date   NA 138 08/10/2019   K 4.4 08/10/2019   CO2 25 08/10/2019   GLUCOSE 112 (H) 08/10/2019   BUN 25 08/10/2019   CREATININE 1.30 (H) 08/10/2019   BILITOT <0.2 05/10/2019   ALKPHOS 96 05/10/2019   AST 42 (H) 05/10/2019   ALT 42 (H) 05/10/2019   PROT 6.8 05/10/2019   ALBUMIN 4.4 05/10/2019   CALCIUM 10.0 08/10/2019   Lab Results  Component Value Date   CHOL 114 05/10/2019   Lab Results  Component Value Date   HDL 39 (L) 05/10/2019   Lab Results  Component Value Date   LDLCALC 51 05/10/2019  Lab Results  Component Value Date   TRIG 134 05/10/2019   Lab Results  Component Value Date   CHOLHDL 2.9 05/10/2019     Assessment & Plan:  1. Hypertensive heart disease without heart failure Well controlled.  No changes to medicines.  Continue to work on eating a healthy diet and exercise.  Labs drawn today.  - CBC with Differential/Platelet - Comprehensive metabolic panel - Cardiovascular Risk Assessment  2. Mixed hyperlipidemia Not at goal with triglycerides.  Recommend change fish oil to vascepa 1 gm 2 capsules twice a  day. Continue crestor 20 mg one at night.  Recommend continue to work on eating healthy diet and exercise. - Lipid panel  3. GERD without esophagitis The current medical regimen is effective;  continue present plan and medications.  4. Fibromyalgia The current medical regimen is effective;  continue present plan and medications. - amitriptyline (ELAVIL) 25 MG tablet; TAKE 1 TABLET(25 MG) BY MOUTH AT BEDTIME  Dispense: 90 tablet; Refill: 3  5. Prediabetes Recommend continue to work on eating healthy diet and exercise. - Hemoglobin A1c  6. Morbid obesity ([place]) Recommend continue to work on eating healthy diet and exercise.  7. Need for immunization against influenza - Flu Vaccine QUAD High Dose(Fluad)  8. Need for vaccination - Pneumococcal polysaccharide vaccine 23-valent greater than or equal to 2yo subcutaneous/IM  9. BMI 45.0-49.9, adult [facility])  Follow-up: 3 months

## 2019-11-15 LAB — COMPREHENSIVE METABOLIC PANEL
ALT: 46 IU/L — ABNORMAL HIGH (ref 0–32)
AST: 41 IU/L — ABNORMAL HIGH (ref 0–40)
Albumin/Globulin Ratio: 1.8 (ref 1.2–2.2)
Albumin: 4.6 g/dL (ref 3.8–4.8)
Alkaline Phosphatase: 103 IU/L (ref 44–121)
BUN/Creatinine Ratio: 19 (ref 12–28)
BUN: 18 mg/dL (ref 8–27)
Bilirubin Total: <0.2 mg/dL (ref 0.0–1.2)
CO2: 26 mmol/L (ref 20–29)
Calcium: 9.8 mg/dL (ref 8.7–10.3)
Chloride: 97 mmol/L (ref 96–106)
Creatinine, Ser: 0.97 mg/dL (ref 0.57–1.00)
GFR calc Af Amer: 70 mL/min/{1.73_m2} (ref >59)
GFR calc non Af Amer: 61 mL/min/{1.73_m2} (ref >59)
Globulin, Total: 2.5 g/dL (ref 1.5–4.5)
Glucose: 113 mg/dL — ABNORMAL HIGH (ref 65–99)
Potassium: 4.3 mmol/L (ref 3.5–5.2)
Sodium: 138 mmol/L (ref 134–144)
Total Protein: 7.1 g/dL (ref 6.0–8.5)

## 2019-11-15 LAB — LIPID PANEL
Chol/HDL Ratio: 3.6 ratio (ref 0.0–4.4)
Cholesterol, Total: 135 mg/dL (ref 100–199)
HDL: 37 mg/dL — ABNORMAL LOW (ref >39)
LDL Chol Calc (NIH): 65 mg/dL (ref 0–99)
Triglycerides: 198 mg/dL — ABNORMAL HIGH (ref 0–149)
VLDL Cholesterol Cal: 33 mg/dL (ref 5–40)

## 2019-11-15 LAB — CARDIOVASCULAR RISK ASSESSMENT

## 2019-11-15 LAB — CBC WITH DIFFERENTIAL/PLATELET
Basophils Absolute: 0.1 10*3/uL (ref 0.0–0.2)
Basos: 1 %
EOS (ABSOLUTE): 0.1 10*3/uL (ref 0.0–0.4)
Eos: 2 %
Hematocrit: 40 % (ref 34.0–46.6)
Hemoglobin: 13.2 g/dL (ref 11.1–15.9)
Immature Grans (Abs): 0 10*3/uL (ref 0.0–0.1)
Immature Granulocytes: 0 %
Lymphocytes Absolute: 2.2 10*3/uL (ref 0.7–3.1)
Lymphs: 26 %
MCH: 31.1 pg (ref 26.6–33.0)
MCHC: 33 g/dL (ref 31.5–35.7)
MCV: 94 fL (ref 79–97)
Monocytes Absolute: 0.6 10*3/uL (ref 0.1–0.9)
Monocytes: 7 %
Neutrophils Absolute: 5.6 10*3/uL (ref 1.4–7.0)
Neutrophils: 64 %
Platelets: 229 10*3/uL (ref 150–450)
RBC: 4.25 x10E6/uL (ref 3.77–5.28)
RDW: 13.4 % (ref 11.7–15.4)
WBC: 8.6 10*3/uL (ref 3.4–10.8)

## 2019-11-15 LAB — HEMOGLOBIN A1C
Est. average glucose Bld gHb Est-mCnc: 120 mg/dL
Hgb A1c MFr Bld: 5.8 % — ABNORMAL HIGH (ref 4.8–5.6)

## 2019-11-20 ENCOUNTER — Encounter: Payer: Self-pay | Admitting: Family Medicine

## 2019-12-07 ENCOUNTER — Telehealth: Payer: Self-pay | Admitting: Family Medicine

## 2019-12-07 NOTE — Chronic Care Management (AMB) (Signed)
  Chronic Care Management   Note  12/07/2019 Name: Cheryl Beard MRN: [id number] DOB: 1955-10-27  Cheryl Beard is a 64 y.o. year old female who is a primary care patient of Cox, Kirsten, MD. I reached out to [name] by phone today in response to a referral sent by [name] PCP, Cox, Kirsten, MD.   [name] was given information about Chronic Care Management services today including:  1. CCM service includes personalized support from designated clinical staff supervised by her physician, including individualized plan of care and coordination with other care providers 2. 24/7 contact phone numbers for assistance for urgent and routine care needs. 3. Service will only be billed when office clinical staff spend 20 minutes or more in a month to coordinate care. 4. Only one practitioner may furnish and bill the service in a calendar month. 5. The patient may stop CCM services at any time (effective at the end of the month) by phone call to the office staff.   [name] verbally agreed to assistance and services provided by embedded care coordination/care management team today.  Follow up plan:   [place]

## 2019-12-13 ENCOUNTER — Encounter: Payer: Self-pay | Admitting: Family Medicine

## 2019-12-13 ENCOUNTER — Other Ambulatory Visit: Payer: Self-pay

## 2019-12-13 ENCOUNTER — Ambulatory Visit (INDEPENDENT_AMBULATORY_CARE_PROVIDER_SITE_OTHER): Payer: PPO | Admitting: Family Medicine

## 2019-12-13 VITALS — BP 132/78 | HR 76 | Temp 97.9°F | Ht 62.0 in | Wt 264.6 lb

## 2019-12-13 DIAGNOSIS — M8580 Other specified disorders of bone density and structure, unspecified site: Secondary | ICD-10-CM

## 2019-12-13 HISTORY — DX: Other specified disorders of bone density and structure, unspecified site: M85.80

## 2019-12-13 NOTE — Patient Instructions (Signed)
  Cheryl Beard , Thank you for taking time to come for your Medicare Wellness Visit. I appreciate your ongoing commitment to your health goals. Please review the following plan we discussed and let me know if I can assist you in the future.   These are the goals we discussed: Eye exam Dental exam Keep Jan appointment for colonoscopy Check on shingles vaccine Keep appointment for mammogram Make appointment for COVID booster  This is a list of the screening recommended for you and due dates:  Health Maintenance  Topic Date Due  .  Hepatitis C: One time screening is recommended by Center for Disease Control  (CDC) for  adults born from [birth year] through 1967.   Never done  . Tetanus Vaccine  Never done  . Mammogram  Never done  . Colon Cancer Screening  Never done  . Pneumonia vaccines (2 of 2 - PCV13) 11/13/2020  . Flu Shot  Completed  . DEXA scan (bone density measurement)  Completed  . COVID-19 Vaccine  Completed

## 2019-12-13 NOTE — Progress Notes (Signed)
Subjective:  Patient ID: Cheryl Beard, female    DOB: [birth year]  Age: 64 y.o. MRN: [id number]  Cc: AWV  HPI Encounter for general adult medical examination without abnormal findings  Physical ("At Risk" items are starred): Patient's last physical exam was 1 year ago .  Smoking: Life-lno longer smoking  Physical Activity: Exercises at least 3 times per week ;  Alcohol/Drug Use: Is a non-drinker ; No illicit drug use ;  Patient is not afflicted from Stress Incontinence and Urge Incontinence  Safety: reviewed ; Patient wears a seat belt, has smoke detectors, has carbon monoxide detectors, practices appropriate gun safety, and wears sunscreen with extended sun exposure. Dental Care: biannual cleanings, brushes and flosses daily. Ophthalmology/Optometry: Annual visit.  Hearing loss: none Vision impairments: none  DEXA in the past-needs one scheduled Mammogram already scheduled in December Needs COVID booster 8 weeks after mammogram  Fall Risk  12/13/2019 05/14/2019 06/27/2018  Falls in the past year? 0 0 0  Number falls in past yr: 0 0 0  Injury with Fall? 0 0 0  Risk for fall due to : No Fall Risks - -  Follow up - Falls evaluation completed -     Depression screen [facility] 2/9 12/13/2019 11/14/2019 06/27/2018  Decreased Interest 0 0 0  Down, Depressed, Hopeless 0 0 0  PHQ - 2 Score 0 0 0       Functional Status Survey: Is the patient deaf or have difficulty hearing?: No Does the patient have difficulty seeing, even when wearing glasses/contacts?: No Does the patient have difficulty concentrating, remembering, or making decisions?: No Does the patient have difficulty walking or climbing stairs?: No Does the patient have difficulty dressing or bathing?: No Does the patient have difficulty doing errands alone such as visiting a doctor's office or shopping?: No   Social Hx   Social History   Socioeconomic History  . Marital status: Married    Spouse name: Not on file  .  Number of children: 3  . Years of education: Not on file  . Highest education level: Not on file  Occupational History  . Not on file  Tobacco Use  . Smoking status: Former Smoker    Quit date: 2002    Years since quitting: 19.8  . Smokeless tobacco: Never Used  Substance and Sexual Activity  . Alcohol use: Never  . Drug use: Never  . Sexual activity: Not on file  Other Topics Concern  . Not on file  Social History Narrative  . Not on file   Social Determinants of Health   Financial Resource Strain:   . Difficulty of Paying Living Expenses: Not on file  Food Insecurity:   . Worried About Charity fundraiser in the Last Year: Not on file  . Ran Out of Food in the Last Year: Not on file  Transportation Needs:   . Lack of Transportation (Medical): Not on file  . Lack of Transportation (Non-Medical): Not on file  Physical Activity:   . Days of Exercise per Week: Not on file  . Minutes of Exercise per Session: Not on file  Stress:   . Feeling of Stress : Not on file  Social Connections:   . Frequency of Communication with Friends and Family: Not on file  . Frequency of Social Gatherings with Friends and Family: Not on file  . Attends Religious Services: Not on file  . Active Member of Clubs or Organizations: Not on file  . Attends  Club or Organization Meetings: Not on file  . Marital Status: Not on file   Past Medical History:  Diagnosis Date  . Allergy   . CAD (coronary artery disease)   . Depression   . Erosive esophagitis   . Erosive gastritis   . Gallbladder disease   . GERD (gastroesophageal reflux disease)   . History of Clostridium difficile infection   . History of COVID-19   . History of myocardial infarction   . Hyperlipidemia   . Hypertension   . Hypertensive urgency   . Idiopathic gout   . Myocardial infarction ([place])   . Nontoxic multinodular goiter   . Other asthma   . Vitamin D deficiency    Family History  Problem Relation Age of Onset  .  Cancer Mother   . Parkinson's disease Mother   . Diabetes Mother   . Hypertension Mother   . Stroke Father   . Hyperlipidemia Sister   . Hypertension Sister   . Alzheimer's disease Maternal Grandmother   . Fibromyalgia Sister   . Cancer Sister   . Cancer Paternal Grandmother        Leukemia     Objective:  BP 132/78 (BP Location: Right Arm, Patient Position: Sitting, Cuff Size: Normal)   Pulse 76   Temp 97.9 F (36.6 C) (Temporal)   Ht 5\' 2"  (1.575 m)   Wt 264 lb 9.6 oz (120 kg)   SpO2 99%   BMI 48.40 kg/m   BP/Weight 12/13/2019 11/14/2019 0/62/3762  Systolic BP 831 517 616  Diastolic BP 78 64 68  Wt. (Lbs) 264.6 270.2 264  BMI 48.4 49.42 48.29     Lab Results  Component Value Date   WBC 8.6 11/14/2019   HGB 13.2 11/14/2019   HCT 40.0 11/14/2019   PLT 229 11/14/2019   GLUCOSE 113 (H) 11/14/2019   CHOL 135 11/14/2019   TRIG 198 (H) 11/14/2019   HDL 37 (L) 11/14/2019   LDLCALC 65 11/14/2019   ALT 46 (H) 11/14/2019   AST 41 (H) 11/14/2019   NA 138 11/14/2019   K 4.3 11/14/2019   CL 97 11/14/2019   CREATININE 0.97 11/14/2019   BUN 18 11/14/2019   CO2 26 11/14/2019   TSH 3.130 05/10/2019   HGBA1C 5.8 (H) 11/14/2019      Assessment & Plan:  These are the goals we discussed: Eye exam Dental exam Colonoscopy appt in Jan 2022-last one 9 years ago-keep appointment This is a list of the screening recommended for you and due dates:  Health Maintenance  Topic Date Due  .  Hepatitis C: One time screening is recommended by Center for Disease Control  (CDC) for  adults born from [birth year] through 1967.   Never done  . Tetanus Vaccine  Never done  . Mammogram  Never done  . Colon Cancer Screening  Never done  . DEXA scan (bone density measurement)  Never done  . Pneumonia vaccines (2 of 2 - PCV13) 11/13/2020  . Flu Shot  Completed  . COVID-19 Vaccine  Completed     AN INDIVIDUALIZED CARE PLAN: was established or reinforced today.   SELF MANAGEMENT: The patient  and I together assessed ways to personally work towards obtaining the recommended goals  My nursing staff have aided in the documentation of this note on the behalf of [place], MD,as directed by  Mertha Baars, MD and thoroughly reviewed by Mertha Baars, MD.  Follow-up: yearly AWV Cheryl Watchman Hannah Beat, MD  [place] [phone]

## 2019-12-15 DIAGNOSIS — Z1231 Encounter for screening mammogram for malignant neoplasm of breast: Secondary | ICD-10-CM | POA: Diagnosis not present

## 2019-12-20 ENCOUNTER — Encounter: Payer: Self-pay | Admitting: Family Medicine

## 2019-12-28 ENCOUNTER — Other Ambulatory Visit: Payer: Self-pay | Admitting: Physician Assistant

## 2020-01-18 ENCOUNTER — Other Ambulatory Visit: Payer: Self-pay | Admitting: Family Medicine

## 2020-01-18 DIAGNOSIS — M797 Fibromyalgia: Secondary | ICD-10-CM

## 2020-01-23 ENCOUNTER — Other Ambulatory Visit: Payer: Self-pay

## 2020-01-23 ENCOUNTER — Ambulatory Visit (INDEPENDENT_AMBULATORY_CARE_PROVIDER_SITE_OTHER): Payer: PPO

## 2020-01-23 DIAGNOSIS — Z23 Encounter for immunization: Secondary | ICD-10-CM | POA: Diagnosis not present

## 2020-01-23 NOTE — Progress Notes (Signed)
° °  Covid-19 Vaccination Clinic  Name:  ASPIN PALOMAREZ    MRN: [id number] DOB: March 09, 1955  01/23/2020  Ms. Vogl was observed post Covid-19 immunization for 15 minutes without incident. She was provided with Vaccine Information Sheet and instruction to access the V-Safe system.   Ms. Tyer was instructed to call 911 with any severe reactions post vaccine:  Difficulty breathing   Swelling of face and throat   A fast heartbeat   A bad rash all over body   Dizziness and weakness   Immunizations Administered    Name Date Dose VIS Date Route   Moderna Covid-19 Booster Vaccine 01/23/2020 10:15 AM 0.25 mL 11/15/2019 Intramuscular   Manufacturer: Gala Murdoch   Lot: [id number]   NDC: 52778-242-35

## 2020-01-29 NOTE — Chronic Care Management (AMB) (Signed)
Chronic Care Management Pharmacy  Name: Cheryl Beard  MRN: [id number] DOB: 11-19-1955  Chief Complaint/ HPI  Cheryl Beard,  65 y.o. , female presents for their Initial CCM visit with the clinical pharmacist via telephone due to COVID-19 Pandemic.  PCP : Cheryl Brome, MD  Plan Recommendations:   Patient requesting metoprolol succinate 25 mg daily to avoid cutting tablets in half.   Patient transferring to [facility] for pharmacy coordination and delivery services.   Patient complains of dripping in her throat when she lays down at night. Discussed a short trial of OTC loratadine to help with potential post nasal drip.    Their chronic conditions include: hypertensive heart disease without heart failure, CAD, hypertension, uncomplicated asthma, GERD without esophagitis, chronic idiopathic gout, osteopenia, hyperlipidemia, fibromyalgia, dyslipidemia.   Office Visits: 01/23/2020 - Moderna COVID vaccine .  12/13/2019 - AWV.  11/14/2019 - change fish oil OTC to Vascepa 1 gram 2 capsules twice daily. Pneumovax 23.  09/14/2019 - amitriptyline for tooth pain. Amoxicillin rx given for sinusitis.  08/10/2019 - continue current medications. Labs drawn.  Consult Visit: N/a  Medications: Outpatient Encounter Medications as of 01/31/2020  Medication Sig   Albuterol Sulfate (PROAIR RESPICLICK) 557 (90 Base) MCG/ACT AEPB Inhale 2 puffs into the lungs as needed (for wheezing).   allopurinol (ZYLOPRIM) 100 MG tablet TAKE 1 TABLET BY MOUTH ONCE DAILY   amitriptyline (ELAVIL) 25 MG tablet TAKE 1 TABLET(25 MG) BY MOUTH AT BEDTIME   aspirin EC 81 MG tablet Take 81 mg by mouth daily.   calcium carbonate (TUMS - DOSED IN MG ELEMENTAL CALCIUM) 500 MG chewable tablet Chew 1 tablet by mouth 2 (two) times daily.   chlorthalidone (HYGROTON) 50 MG tablet TAKE 1 TABLET(50 MG) BY MOUTH DAILY   cholecalciferol (VITAMIN D3) 25 MCG (1000 UNIT) tablet Take 1,000 Units by mouth daily.   EDARBI 80  MG TABS Take 1 tablet (80 mg total) by mouth daily.   metoprolol succinate (TOPROL-XL) 50 MG 24 hr tablet TAKE 1/2 TABLET BY MOUTH EVERY DAY   Multiple Vitamins-Minerals (WOMENS MULTIVITAMIN PLUS PO) Take 1 tablet by mouth every morning.   Omega-3 Fatty Acids (FISH OIL) 1000 MG CAPS Take 2 capsules by mouth daily.   pantoprazole (PROTONIX) 40 MG tablet TAKE 1 TABLET BY MOUTH DAILY (Patient taking differently: Take 40 mg by mouth every evening.)   rosuvastatin (CRESTOR) 20 MG tablet TAKE 1 TABLET BY MOUTH EVERY DAY AT NIGHT   No facility-administered encounter medications on file as of 01/31/2020.   Allergies  Allergen Reactions   Candesartan    Clarithromycin    Colesevelam    Duloxetine    Pregabalin    SDOH Screenings   Alcohol Screen: Not on file  Depression (PHQ2-9): Low Risk    PHQ-2 Score: 0  Financial Resource Strain: Not on file  Food Insecurity: No Food Insecurity   Worried About Charity fundraiser in the Last Year: Never true   Ran Out of Food in the Last Year: Never true  Housing: Low Risk    Last Housing Risk Score: 0  Physical Activity: Not on file  Social Connections: Not on file  Stress: Not on file  Tobacco Use: Medium Risk   Smoking Tobacco Use: Former Smoker   Smokeless Tobacco Use: Never Used  Transportation Needs: No Data processing manager (Medical): No   Lack of Transportation (Non-Medical): No    Current Diagnosis/Assessment:  Goals Addressed  This Visit's Progress    Pharmacy Care Plan       CARE PLAN ENTRY (see longitudinal plan of care for additional care plan information)  Current Barriers:   Chronic Disease Management support, education, and care coordination needs related to Hypertension and Hyperlipidemia   Hypertension BP Readings from Last 3 Encounters:  12/13/19 132/78  11/14/19 110/64  09/14/19 106/68    Pharmacist Clinical Goal(s): o Over the next 90 days, patient will  work with PharmD and providers to maintain BP goal <130/80  Current regimen:   Chlorthalidone 50 mg daily   Edarbi 80 mg daily   Metoprolol succinate 50 mg 1/2 daily  Interventions: o Reviewed home blood pressure readings and medication regimen.  o Encouraged continued healthy diet of vegetables, fruit and lean protein.  o Discussed goal of walking for exercise and working up to ultimately goal 150 minutes per week of moderate exercise.  o Patient requesting metoprolol succinate 25 mg daily prescription to avoid cutting tablets in half.   Patient self care activities - Over the next 90 days, patient will: o Check BP daily, document, and provide at future appointments o Ensure daily salt intake < 2300 mg/day  Hyperlipidemia Lab Results  Component Value Date/Time   LDLCALC 65 11/14/2019 03:26 PM    Pharmacist Clinical Goal(s): o Over the next 90 days, patient will work with PharmD and providers to maintain LDL goal < 70   Current regimen:   Rosuvastatin 20 mg daily at night  Aspirin EC 81 mg daily   Interventions: o Reviewed medication regimen and fill history.  o Encouraged heart healthy diet of lean meats, vegetables and fruits.   Patient self care activities - Over the next 90 days, patient will: o Continue taking medication daily as prescribed.   Medication management  Pharmacist Clinical Goal(s): o Over the next 90 days, patient will work with PharmD and providers to maintain optimal medication adherence  Current pharmacy: [facility]  Interventions o Comprehensive medication review performed. o Utilize [facility] pharmacy for medication synchronization, packaging and delivery  Patient self care activities - Over the next 90 days, patient will: o Focus on medication adherence by medication delivery o Take medications as prescribed o Report any questions or concerns to PharmD and/or provider(s)  Initial goal documentation       Asthma    Eosinophil  count:  No results found for: EOSPCT%                               Eos (Absolute):  Lab Results  Component Value Date/Time   EOSABS 0.1 11/14/2019 03:26 PM    Tobacco Status:  Social History   Tobacco Use  Smoking Status Former Smoker   Quit date: 2002   Years since quitting: 20.0  Smokeless Tobacco Never Used    Patient has failed these meds in past: none reported Patient is currently controlled on the following medications:   Proair Respiclick 2 puffs into the lungs prn for wheezing   Using maintenance inhaler regularly? No Frequency of rescue inhaler use:  infrequently  We discussed: Patient reports using rarely and her main trigger is weather change or seasonal issues. Patient has used OTC antihistamine as needed in the past. States that she has noticed a drainage/dripping in her throat when laying flat. Discussed trial of OTC loratadine to help with possible drainage.   Plan  Continue current medications   Hypertension   BP  today is:  <130/80  Office blood pressures are  BP Readings from Last 3 Encounters:  12/13/19 132/78  11/14/19 110/64  09/14/19 106/68   Lab Results  Component Value Date   CREATININE 0.97 11/14/2019   BUN 18 11/14/2019   GFRNONAA 61 11/14/2019   GFRAA 70 11/14/2019   NA 138 11/14/2019   K 4.3 11/14/2019   CALCIUM 9.8 11/14/2019   CO2 26 11/14/2019     Patient has failed these meds in the past: valsartan, candesartan Patient is currently controlled on the following medications:   Chlorthalidone 50 mg daily   Edarbi 80 mg daily   Metoprolol succinate 50 mg 1/2 daily  Patient checks BP at home daily  Patient home BP readings are ranging: 116/60 p 76  We discussed diet and exercise extensively. Patient uses [facility] - 3 month supply for $120 for Edarbi. Patient eating heart healthy/low sodium diet.   Patient requesting Metoprolol succinate 25 mg daily prescription so she doesn't have to cut them in half.    Plan  Requesting metoprolol succinate 25 mg daily prescription.     Gout    Patient has failed these meds in past: none reported Patient is currently controlled on the following medications:   allopurinol 100 mg daily   We discussed:  Patient reports well controlled and no flares. Takes medication daily as prescribed. Tries to avoid red meat.   Plan  Continue current medications  Hyperlipidemia   LDL goal < 70  Last lipids Lab Results  Component Value Date   CHOL 135 11/14/2019   HDL 37 (L) 11/14/2019   LDLCALC 65 11/14/2019   TRIG 198 (H) 11/14/2019   CHOLHDL 3.6 11/14/2019   Hepatic Function Latest Ref Rng & Units 11/14/2019 05/10/2019  Total Protein 6.0 - 8.5 g/dL 7.1 6.8  Albumin 3.8 - 4.8 g/dL 4.6 4.4  AST 0 - 40 IU/L 41(H) 42(H)  ALT 0 - 32 IU/L 46(H) 42(H)  Alk Phosphatase 44 - 121 IU/L 103 96  Total Bilirubin 0.0 - 1.2 mg/dL <0.2 <0.2     The ASCVD Risk score (Crabtree., et al., 2013) failed to calculate for the following reasons:   The patient has a prior MI or stroke diagnosis   Patient has failed these meds in past: none reported Patient is currently controlled on the following medications:   Rosuvastatin 20 mg daily at night  Aspirin EC 81 mg daily   We discussed:  diet and exercise extensively   Diet: Eats a lot of vegetables. Helping her husband eat healthier since hospitalization. Likes pasta but trying to watch carbohydrates. Eats hamburger meat occasionally. Eats chicken regularly.   Exercise: Patient has flat feet. Uses inserts in shoes to correct. Has to limit walking in the past due to flat feet issues. Discussed benefits of beginning to walk 5 minutes daily adding a minute each day as tolerated to work up to goal of 150 minutes each week or 30 minutes daily. Patient states that she is going to try to start walking with her husband to help him build back up his stamina.   Plan  Continue current medications   GERD   Patient has  failed these meds in past: nexium Patient is currently controlled on the following medications:   pantoprazole 40 mg daily at night   We discussed:  States she has bad acid reflux. Dr. Lyda Jester states that her stomach looked terrible in a scan years ago and started PPI. She has  symptoms occasionally but better controlled since taking pantoprazole 40 mg daily at night. Patient following up with Dr. Merwyn Katos soon to repeat scan. Feels like she has something leaking/dripping in her throat when she lays down. Encouraged elevating head while sleeping and considering a trial of loratadine.   Plan  Continue current medications  Osteopenia / Osteoporosis   Last DEXA Scan: ordered but patient has not had yet  No results found for: VD25OH   Patient has failed these meds in past: none reported Patient is currently controlled on the following medications:   Calcium carbonate with D 500 mg bid   We discussed:  Recommend [phone] units of vitamin D daily. Recommend 1200 mg of calcium daily from dietary and supplemental sources. Recommend weight-bearing and muscle strengthening exercises for building and maintaining bone density.   Eats yogurt, cottage cheese. Doesn't drink milk due to makes her stomach hurts. Pharmacist sending message to referral coordinator to follow-up on Dexa scan. Will review at upcoming visit to determine if patient needs a prescription   Plan  Continue current medications  Health Maintenance   Patient is currently controlled on the following medications:   amitriptyline 25 mg daily at bedtime -nerve pain in tooth  Multivitamin daily   Omega 3 Fish Oil 1000 mg -2 capsules daily   We discussed:  Patient reports amitriptyline 25 mg daily has helped her fibromyalgia pain as well as nerve pain with tooth. Denies falls or dizziness. Patient sleeps well. Denies getting up during night.   Plan  Continue current medications  Vaccines   Reviewed and discussed  patient's vaccination history.  Prevnar 13 completed in office 10/09/2018. Patient states third COVID shot made her feel bad for a week.   Immunization History  Administered Date(s) Administered   Fluad Quad(high Dose 65+) 11/14/2019   Moderna SARS-COV2 Booster Vaccination 01/23/2020   Moderna Sars-Covid-2 Vaccination 03/10/2019, 04/05/2019   Pneumococcal Polysaccharide-23 11/14/2019    Plan  Patient has completed her series of pneumonia and COVID for now.   Medication Management   Patient's preferred pharmacy is:  [facility] [place], [place] - [address] AT [address] [place] [address] Phone: [phone] Fax: [phone]  [place], [place] [address] [place] [address] Phone: [phone] Fax: [phone]  Uses pill box? Yes Pt endorses good compliance  We discussed: Discussed benefits of medication synchronization, packaging and delivery as well as enhanced pharmacist oversight with [facility].  Verbal consent obtained for [facility] enhanced pharmacy services (medication synchronization, adherence packaging, delivery coordination). A medication sync plan was created to allow patient to get all medications delivered once every 30 to 90 days per patient preference. Patient understands they have freedom to choose pharmacy and clinical pharmacist will coordinate care between all prescribers and [facility].  Plan  Utilize [facility] pharmacy for medication synchronization, packaging and delivery   Follow up: 6 month phone visit

## 2020-01-31 ENCOUNTER — Ambulatory Visit: Payer: PPO

## 2020-01-31 ENCOUNTER — Other Ambulatory Visit: Payer: Self-pay

## 2020-01-31 DIAGNOSIS — E782 Mixed hyperlipidemia: Secondary | ICD-10-CM

## 2020-01-31 DIAGNOSIS — I119 Hypertensive heart disease without heart failure: Secondary | ICD-10-CM

## 2020-01-31 NOTE — Patient Instructions (Addendum)
Visit Information  Thank you for your time discussing your medications. I look forward to working with you to achieve your health care goals. Below is a summary of what we talked about during our visit.   Goals Addressed            This Visit's Progress   . Pharmacy Care Plan       CARE PLAN ENTRY (see longitudinal plan of care for additional care plan information)  Current Barriers:  . Chronic Disease Management support, education, and care coordination needs related to Hypertension and Hyperlipidemia   Hypertension BP Readings from Last 3 Encounters:  12/13/19 132/78  11/14/19 110/64  09/14/19 106/68   . Pharmacist Clinical Goal(s): o Over the next 90 days, patient will work with PharmD and providers to maintain BP goal <130/80 . Current regimen:   Chlorthalidone 50 mg daily   Edarbi 80 mg daily   Metoprolol succinate 50 mg 1/2 daily . Interventions: o Reviewed home blood pressure readings and medication regimen.  o Encouraged continued healthy diet of vegetables, fruit and lean protein.  o Discussed goal of walking for exercise and working up to ultimately goal 150 minutes per week of moderate exercise.  o Patient requesting metoprolol succinate 25 mg daily prescription to avoid cutting tablets in half.  . Patient self care activities - Over the next 90 days, patient will: o Check BP daily, document, and provide at future appointments o Ensure daily salt intake < 2300 mg/day  Hyperlipidemia Lab Results  Component Value Date/Time   LDLCALC 65 11/14/2019 03:26 PM   . Pharmacist Clinical Goal(s): o Over the next 90 days, patient will work with PharmD and providers to maintain LDL goal < 70  . Current regimen:  . Rosuvastatin 20 mg daily at night . Aspirin EC 81 mg daily  . Interventions: o Reviewed medication regimen and fill history.  o Encouraged heart healthy diet of lean meats, vegetables and fruits.  . Patient self care activities - Over the next 90 days,  patient will: o Continue taking medication daily as prescribed.   Medication management . Pharmacist Clinical Goal(s): o Over the next 90 days, patient will work with PharmD and providers to maintain optimal medication adherence . Current pharmacy: [facility] . Interventions o Comprehensive medication review performed. o Utilize [facility] for medication synchronization, packaging and delivery . Patient self care activities - Over the next 90 days, patient will: o Focus on medication adherence by medication delivery o Take medications as prescribed o Report any questions or concerns to PharmD and/or provider(s)  Initial goal documentation        Ms. Rascon was given information about Chronic Care Management services today including:  1. CCM service includes personalized support from designated clinical staff supervised by her physician, including individualized plan of care and coordination with other care providers 2. 24/7 contact phone numbers for assistance for urgent and routine care needs. 3. Standard insurance, coinsurance, copays and deductibles apply for chronic care management only during months in which we provide at least 20 minutes of these services. Most insurances cover these services at 100%, however patients may be responsible for any copay, coinsurance and/or deductible if applicable. This service may help you avoid the need for more expensive face-to-face services. 4. Only one practitioner may furnish and bill the service in a calendar month. 5. The patient may stop CCM services at any time (effective at the end of the month) by phone call to the office staff.  Patient agreed to services and verbal consent obtained.   The patient verbalized understanding of instructions, educational materials, and care plan provided today and agreed to receive a mailed copy of patient instructions, educational materials, and care plan.  Telephone follow up appointment with  pharmacy team member scheduled for: 07/2020  Juliane Lack, PharmD Clinical Pharmacist [facility] [phone] (office) [phone] (mobile)  Food Choices for Gastroesophageal Reflux Disease, Adult When you have gastroesophageal reflux disease (GERD), the foods you eat and your eating habits are very important. Choosing the right foods can help ease your discomfort. Think about working with a nutrition specialist (dietitian) to help you make good choices. What are tips for following this plan?  Meals  Choose healthy foods that are low in fat, such as fruits, vegetables, whole grains, low-fat dairy products, and lean meat, fish, and poultry.  Eat small meals often instead of 3 large meals a day. Eat your meals slowly, and in a place where you are relaxed. Avoid bending over or lying down until 2-3 hours after eating.  Avoid eating meals 2-3 hours before bed.  Avoid drinking a lot of liquid with meals.  Cook foods using methods other than frying. Bake, grill, or broil food instead.  Avoid or limit: ? Chocolate. ? Peppermint or spearmint. ? Alcohol. ? Pepper. ? Black and decaffeinated coffee. ? Black and decaffeinated tea. ? Bubbly (carbonated) soft drinks. ? Caffeinated energy drinks and soft drinks.  Limit high-fat foods such as: ? Fatty meat or fried foods. ? Whole milk, cream, butter, or ice cream. ? Nuts and nut butters. ? Pastries, donuts, and sweets made with butter or shortening.  Avoid foods that cause symptoms. These foods may be different for everyone. Common foods that cause symptoms include: ? Tomatoes. ? Oranges, lemons, and limes. ? Peppers. ? Spicy food. ? Onions and garlic. ? Vinegar. Lifestyle  Maintain a healthy weight. Ask your doctor what weight is healthy for you. If you need to lose weight, work with your doctor to do so safely.  Exercise for at least 30 minutes for 5 or more days each week, or as told by your doctor.  Wear  loose-fitting clothes.  Do not smoke. If you need help quitting, ask your doctor.  Sleep with the head of your bed higher than your feet. Use a wedge under the mattress or blocks under the bed frame to raise the head of the bed. Summary  When you have gastroesophageal reflux disease (GERD), food and lifestyle choices are very important in easing your symptoms.  Eat small meals often instead of 3 large meals a day. Eat your meals slowly, and in a place where you are relaxed.  Limit high-fat foods such as fatty meat or fried foods.  Avoid bending over or lying down until 2-3 hours after eating.  Avoid peppermint and spearmint, caffeine, alcohol, and chocolate. This information is not intended to replace advice given to you by your health care provider. Make sure you discuss any questions you have with your health care provider. Document Revised: 05/05/2018 Document Reviewed: 02/18/2016 Elsevier Patient Education  2020 [employer].

## 2020-02-05 ENCOUNTER — Telehealth: Payer: Self-pay

## 2020-02-05 DIAGNOSIS — M8589 Other specified disorders of bone density and structure, multiple sites: Secondary | ICD-10-CM | POA: Diagnosis not present

## 2020-02-05 DIAGNOSIS — Z13820 Encounter for screening for osteoporosis: Secondary | ICD-10-CM | POA: Diagnosis not present

## 2020-02-05 DIAGNOSIS — M810 Age-related osteoporosis without current pathological fracture: Secondary | ICD-10-CM | POA: Diagnosis not present

## 2020-02-05 DIAGNOSIS — M8588 Other specified disorders of bone density and structure, other site: Secondary | ICD-10-CM | POA: Diagnosis not present

## 2020-02-06 ENCOUNTER — Other Ambulatory Visit: Payer: Self-pay

## 2020-02-06 DIAGNOSIS — M797 Fibromyalgia: Secondary | ICD-10-CM

## 2020-02-06 DIAGNOSIS — J45909 Unspecified asthma, uncomplicated: Secondary | ICD-10-CM

## 2020-02-06 MED ORDER — PANTOPRAZOLE SODIUM 40 MG PO TBEC
40.0000 mg | DELAYED_RELEASE_TABLET | Freq: Every day | ORAL | 3 refills | Status: DC
Start: 2020-02-06 — End: 2021-04-03

## 2020-02-06 MED ORDER — AMITRIPTYLINE HCL 25 MG PO TABS: ORAL_TABLET | ORAL | 1 refills | Status: DC

## 2020-02-06 MED ORDER — ALBUTEROL SULFATE 108 (90 BASE) MCG/ACT IN AEPB: 2.0000 | INHALATION_SPRAY | RESPIRATORY_TRACT | 2 refills | Status: DC | PRN

## 2020-02-06 MED ORDER — METOPROLOL SUCCINATE ER 25 MG PO TB24: 25.0000 mg | ORAL_TABLET | Freq: Every day | ORAL | 3 refills | Status: DC

## 2020-02-06 MED ORDER — ALLOPURINOL 100 MG PO TABS
100.0000 mg | ORAL_TABLET | Freq: Every day | ORAL | 1 refills | Status: DC
Start: 2020-02-06 — End: 2020-03-27

## 2020-02-06 MED ORDER — CHLORTHALIDONE 50 MG PO TABS
ORAL_TABLET | ORAL | 1 refills | Status: DC
Start: 2020-02-06 — End: 2020-06-11

## 2020-02-06 MED ORDER — ROSUVASTATIN CALCIUM 20 MG PO TABS: ORAL_TABLET | ORAL | 0 refills | Status: DC

## 2020-02-06 NOTE — Progress Notes (Signed)
° ° °  Chronic Care Management Pharmacy Assistant   Name: Cheryl Beard  MRN: [id number] DOB: 04-07-1955  Reason for Encounter: Medication Review for onboard form.  At the request of of the patient and Donette Larry, CPP, an onboard form was completed for the patient to start using [facility].   The patient has chosen to do 90 day with packaging.  I went over each medication with the patient to determine when her coordination date would be.  Form was completed and sent to Donette Larry, CPP for approval.   An acute form was also submitted for Pantopazole 40mg     PCP : [name], MD  Allergies:   Allergies  Allergen Reactions   Candesartan    Clarithromycin    Colesevelam    Duloxetine    Pregabalin     Medications: Outpatient Encounter Medications as of 02/05/2020  Medication Sig   Albuterol Sulfate (PROAIR RESPICLICK) 809 (90 Base) MCG/ACT AEPB Inhale 2 puffs into the lungs as needed (for wheezing).   allopurinol (ZYLOPRIM) 100 MG tablet TAKE 1 TABLET BY MOUTH ONCE DAILY   amitriptyline (ELAVIL) 25 MG tablet TAKE 1 TABLET(25 MG) BY MOUTH AT BEDTIME   aspirin EC 81 MG tablet Take 81 mg by mouth daily.   calcium carbonate (TUMS - DOSED IN MG ELEMENTAL CALCIUM) 500 MG chewable tablet Chew 1 tablet by mouth 2 (two) times daily.   chlorthalidone (HYGROTON) 50 MG tablet TAKE 1 TABLET(50 MG) BY MOUTH DAILY   cholecalciferol (VITAMIN D3) 25 MCG (1000 UNIT) tablet Take 1,000 Units by mouth daily.   EDARBI 80 MG TABS Take 1 tablet (80 mg total) by mouth daily.   metoprolol succinate (TOPROL-XL) 50 MG 24 hr tablet TAKE 1/2 TABLET BY MOUTH EVERY DAY   Multiple Vitamins-Minerals (WOMENS MULTIVITAMIN PLUS PO) Take 1 tablet by mouth every morning.   Omega-3 Fatty Acids (FISH OIL) 1000 MG CAPS Take 2 capsules by mouth daily.   pantoprazole (PROTONIX) 40 MG tablet TAKE 1 TABLET BY MOUTH DAILY (Patient taking differently: Take 40 mg by mouth every evening.)    rosuvastatin (CRESTOR) 20 MG tablet TAKE 1 TABLET BY MOUTH EVERY DAY AT NIGHT   No facility-administered encounter medications on file as of 02/05/2020.    Current Diagnosis: Patient Active Problem List   Diagnosis Date Noted   Osteopenia 98/33/8250   Uncomplicated asthma 53/97/6734   Morbid obesity ([place]) 05/14/2019   BMI 45.0-49.9, adult ([place]) 05/14/2019   Hypertensive heart disease without heart failure 05/10/2019   Mixed hyperlipidemia 05/10/2019   Nontoxic multinodular goiter 05/10/2019   Chronic idiopathic gout involving toe of left foot without tophus 05/10/2019   GERD without esophagitis 05/10/2019   Impaired fasting glucose 05/10/2019   Telogen effluvium 05/10/2019   Fibromyalgia 09/11/2014   Coronary artery disease involving native coronary artery of native heart without angina pectoris 08/14/2014   Essential hypertension 08/14/2014   Dyslipidemia 08/14/2014      Follow-Up:  Coordination of Enhanced Pharmacy Services and Pharmacist Review  Donette Larry, CPP notified  Clarita Leber, [place] Pharmacist Assistant [phone]

## 2020-02-14 NOTE — Progress Notes (Signed)
Subjective:  Patient ID: Cheryl Beard, female    DOB: Nov 18, 1955  Age: 65 y.o. MRN: [id number]  Chief Complaint  Patient presents with  . Hyperlipidemia  . Hypertension  . Gastroesophageal Reflux    HPI Hyperlipidemia: Patient is improving her diet and walking a lot. Patient takes fish oil and rosuvastatin 20 mg daily.  Hypertension: Patient takes aspirin 81 mg, azilsartan 80mg , chlorthalidone 50 mg, metoprolol 25mg . Patient checks her blood pressure at home. Blood pressure range is 111/62 to 136/67. GERD:  Patient takes pantoprazole 40 mg daily and she tried to improve her diet.   Current Outpatient Medications on File Prior to Visit  Medication Sig Dispense Refill  . Albuterol Sulfate (PROAIR RESPICLICK) 295 (90 Base) MCG/ACT AEPB Inhale 2 puffs into the lungs as needed (for wheezing). 1 each 2  . allopurinol (ZYLOPRIM) 100 MG tablet Take 1 tablet (100 mg total) by mouth daily. 90 tablet 1  . amitriptyline (ELAVIL) 25 MG tablet Take one tablet by mouth at bedtime 90 tablet 1  . aspirin EC 81 MG tablet Take 81 mg by mouth daily.    . calcium carbonate (TUMS - DOSED IN MG ELEMENTAL CALCIUM) 500 MG chewable tablet Chew 1 tablet by mouth 2 (two) times daily.    . chlorthalidone (HYGROTON) 50 MG tablet TAKE 1 TABLET(50 MG) BY MOUTH DAILY 90 tablet 1  . cholecalciferol (VITAMIN D3) 25 MCG (1000 UNIT) tablet Take 1,000 Units by mouth daily.    [name] EDARBI 80 MG TABS Take 1 tablet (80 mg total) by mouth daily. 90 tablet 0  . metoprolol succinate (TOPROL-XL) 25 MG 24 hr tablet Take 1 tablet (25 mg total) by mouth daily. 90 tablet 3  . Multiple Vitamins-Minerals (WOMENS MULTIVITAMIN PLUS PO) Take 1 tablet by mouth every morning.    . Omega-3 Fatty Acids (FISH OIL) 1000 MG CAPS Take 2 capsules by mouth daily.    . pantoprazole (PROTONIX) 40 MG tablet Take 1 tablet (40 mg total) by mouth daily. 90 tablet 3  . rosuvastatin (CRESTOR) 20 MG tablet TAKE 1 TABLET BY MOUTH EVERY DAY AT NIGHT 90  tablet 0   No current facility-administered medications on file prior to visit.   Past Medical History:  Diagnosis Date  . Allergy   . CAD (coronary artery disease)   . Depression   . Erosive esophagitis   . Erosive gastritis   . Gallbladder disease   . GERD (gastroesophageal reflux disease)   . History of Clostridium difficile infection   . History of COVID-19   . History of myocardial infarction   . Hyperlipidemia   . Hypertension   . Hypertensive urgency   . Idiopathic gout   . Myocardial infarction ([place])   . Nontoxic multinodular goiter   . Other asthma   . Vitamin D deficiency    Past Surgical History:  Procedure Laterality Date  . ABDOMINAL HYSTERECTOMY    . APPENDECTOMY    . CHOLECYSTECTOMY    . ROTATOR CUFF REPAIR      Family History  Problem Relation Age of Onset  . Cancer Mother   . Parkinson's disease Mother   . Diabetes Mother   . Hypertension Mother   . Stroke Father   . Hyperlipidemia Sister   . Hypertension Sister   . Alzheimer's disease Maternal Grandmother   . Fibromyalgia Sister   . Cancer Sister   . Cancer Paternal Grandmother        Leukemia   Social History  Socioeconomic History  . Marital status: Married    Spouse name: Not on file  . Number of children: 3  . Years of education: Not on file  . Highest education level: Not on file  Occupational History  . Not on file  Tobacco Use  . Smoking status: Former Smoker    Quit date: 2002    Years since quitting: 20.0  . Smokeless tobacco: Never Used  Substance and Sexual Activity  . Alcohol use: Never  . Drug use: Never  . Sexual activity: Not on file  Other Topics Concern  . Not on file  Social History Narrative  . Not on file   Social Determinants of Health   Financial Resource Strain: Not on file  Food Insecurity: No Food Insecurity  . Worried About Charity fundraiser in the Last Year: Never true  . Ran Out of Food in the Last Year: Never true  Transportation Needs: No  Transportation Needs  . Lack of Transportation (Medical): No  . Lack of Transportation (Non-Medical): No  Physical Activity: Not on file  Stress: Not on file  Social Connections: Not on file    Review of Systems  Constitutional: Positive for fatigue. Negative for chills and fever.  HENT: Positive for congestion. Negative for rhinorrhea and sore throat.   Respiratory: Negative for cough and shortness of breath.   Cardiovascular: Negative for chest pain.  Gastrointestinal: Negative for abdominal pain, constipation, diarrhea, nausea and vomiting.  Endocrine: Negative for polydipsia and polyphagia.  Genitourinary: Negative for dysuria and urgency.  Musculoskeletal: Positive for arthralgias. Negative for back pain and myalgias.  Neurological: Negative for dizziness, weakness, light-headedness and headaches.  Psychiatric/Behavioral: Negative for dysphoric mood. The patient is not nervous/anxious.      Objective:  BP 130/72   Pulse 84   Temp 97.8 F (36.6 C)   Resp 16   Ht 5\' 2"  (1.575 m)   Wt 267 lb (121.1 kg)   BMI 48.83 kg/m   BP/Weight 02/15/2020 12/13/2019 [id number]  Systolic BP [id number]  Diastolic BP 72 78 64  Wt. (Lbs) 267 264.6 270.2  BMI 48.83 48.4 49.42    Physical Exam Constitutional:      Appearance: Normal appearance. She is obese.  Cardiovascular:     Rate and Rhythm: Normal rate and regular rhythm.     Pulses: Normal pulses.     Heart sounds: Normal heart sounds.  Pulmonary:     Effort: Pulmonary effort is normal.     Breath sounds: Normal breath sounds.  Abdominal:     General: Bowel sounds are normal. There is no distension.     Palpations: Abdomen is soft. There is no mass.     Tenderness: There is no abdominal tenderness.  Neurological:     Mental Status: She is alert.  Psychiatric:        Mood and Affect: Mood normal.        Behavior: Behavior normal.        Thought Content: Thought content normal.        Judgment: Judgment normal.      Diabetic Foot Exam - Simple   No data filed      Lab Results  Component Value Date   WBC 8.3 02/15/2020   HGB 13.5 02/15/2020   HCT 40.8 02/15/2020   PLT 218 02/15/2020   GLUCOSE 109 (H) 02/15/2020   CHOL 129 02/15/2020   TRIG 214 (H) 02/15/2020   HDL 39 (L) 02/15/2020  LDLCALC 55 02/15/2020   ALT 38 (H) 02/15/2020   AST 34 02/15/2020   NA 138 02/15/2020   K 4.6 02/15/2020   CL 97 02/15/2020   CREATININE 1.05 (H) 02/15/2020   BUN 23 02/15/2020   CO2 26 02/15/2020   TSH 3.130 05/10/2019   HGBA1C 6.1 (H) 02/15/2020      Assessment & Plan:   1. Mixed hyperlipidemia Not at goal Recommend low-fat diet and exercise. Intolerant to Lovaza.  Continue rosuvastatin 20 mg once daily. - Lipid panel  2. Hypertensive heart disease without heart failure Continue current medications. Well-controlled. Keep follow-up with cardiology. - Comprehensive metabolic panel  3. GERD without esophagitis Continue pantoprazole 40 mg once daily.  4. Fibromyalgia Encouraged activity.  5. Prediabetes Low sugar diet. - CBC with Differential/Platelet - Hemoglobin A1c   6.  Morbid obesity with BMI between 45 and 49. Low-fat diet, low sugar diet, low-salt diet, increased exercise, and recommend weight loss. No orders of the defined types were placed in this encounter.   Orders Placed This Encounter  Procedures  . CBC with Differential/Platelet  . Lipid panel  . Hemoglobin A1c  . Comprehensive metabolic panel  . Cardiovascular Risk Assessment     Follow-up: Return in about 3 months (around 05/15/2020) for fasting.  An After Visit Summary was printed and given to the patient.  [name], MD [facility] [phone]

## 2020-02-15 ENCOUNTER — Ambulatory Visit (INDEPENDENT_AMBULATORY_CARE_PROVIDER_SITE_OTHER): Payer: PPO | Admitting: Family Medicine

## 2020-02-15 ENCOUNTER — Other Ambulatory Visit: Payer: Self-pay

## 2020-02-15 VITALS — BP 130/72 | HR 84 | Temp 97.8°F | Resp 16 | Ht 62.0 in | Wt 267.0 lb

## 2020-02-15 DIAGNOSIS — R7303 Prediabetes: Secondary | ICD-10-CM | POA: Diagnosis not present

## 2020-02-15 DIAGNOSIS — K219 Gastro-esophageal reflux disease without esophagitis: Secondary | ICD-10-CM

## 2020-02-15 DIAGNOSIS — E782 Mixed hyperlipidemia: Secondary | ICD-10-CM

## 2020-02-15 DIAGNOSIS — Z6841 Body Mass Index (BMI) 40.0 and over, adult: Secondary | ICD-10-CM

## 2020-02-15 DIAGNOSIS — M797 Fibromyalgia: Secondary | ICD-10-CM | POA: Diagnosis not present

## 2020-02-15 DIAGNOSIS — I119 Hypertensive heart disease without heart failure: Secondary | ICD-10-CM

## 2020-02-16 LAB — COMPREHENSIVE METABOLIC PANEL
ALT: 38 IU/L — ABNORMAL HIGH (ref 0–32)
AST: 34 IU/L (ref 0–40)
Albumin/Globulin Ratio: 1.6 (ref 1.2–2.2)
Albumin: 4.5 g/dL (ref 3.8–4.8)
Alkaline Phosphatase: 97 IU/L (ref 44–121)
BUN/Creatinine Ratio: 22 (ref 12–28)
BUN: 23 mg/dL (ref 8–27)
Bilirubin Total: 0.2 mg/dL (ref 0.0–1.2)
CO2: 26 mmol/L (ref 20–29)
Calcium: 9.9 mg/dL (ref 8.7–10.3)
Chloride: 97 mmol/L (ref 96–106)
Creatinine, Ser: 1.05 mg/dL — ABNORMAL HIGH (ref 0.57–1.00)
GFR calc Af Amer: 64 mL/min/{1.73_m2} (ref >59)
GFR calc non Af Amer: 55 mL/min/{1.73_m2} — ABNORMAL LOW (ref >59)
Globulin, Total: 2.8 g/dL (ref 1.5–4.5)
Glucose: 109 mg/dL — ABNORMAL HIGH (ref 65–99)
Potassium: 4.6 mmol/L (ref 3.5–5.2)
Sodium: 138 mmol/L (ref 134–144)
Total Protein: 7.3 g/dL (ref 6.0–8.5)

## 2020-02-16 LAB — CBC WITH DIFFERENTIAL/PLATELET
Basophils Absolute: 0.1 10*3/uL (ref 0.0–0.2)
Basos: 1 %
EOS (ABSOLUTE): 0.1 10*3/uL (ref 0.0–0.4)
Eos: 1 %
Hematocrit: 40.8 % (ref 34.0–46.6)
Hemoglobin: 13.5 g/dL (ref 11.1–15.9)
Immature Grans (Abs): 0 10*3/uL (ref 0.0–0.1)
Immature Granulocytes: 0 %
Lymphocytes Absolute: 2.7 10*3/uL (ref 0.7–3.1)
Lymphs: 32 %
MCH: 30.3 pg (ref 26.6–33.0)
MCHC: 33.1 g/dL (ref 31.5–35.7)
MCV: 92 fL (ref 79–97)
Monocytes Absolute: 0.6 10*3/uL (ref 0.1–0.9)
Monocytes: 8 %
Neutrophils Absolute: 4.8 10*3/uL (ref 1.4–7.0)
Neutrophils: 58 %
Platelets: 218 10*3/uL (ref 150–450)
RBC: 4.46 x10E6/uL (ref 3.77–5.28)
RDW: 13.2 % (ref 11.7–15.4)
WBC: 8.3 10*3/uL (ref 3.4–10.8)

## 2020-02-16 LAB — CARDIOVASCULAR RISK ASSESSMENT

## 2020-02-16 LAB — LIPID PANEL
Chol/HDL Ratio: 3.3 ratio (ref 0.0–4.4)
Cholesterol, Total: 129 mg/dL (ref 100–199)
HDL: 39 mg/dL — ABNORMAL LOW (ref >39)
LDL Chol Calc (NIH): 55 mg/dL (ref 0–99)
Triglycerides: 214 mg/dL — ABNORMAL HIGH (ref 0–149)
VLDL Cholesterol Cal: 35 mg/dL (ref 5–40)

## 2020-02-16 LAB — HEMOGLOBIN A1C
Est. average glucose Bld gHb Est-mCnc: 128 mg/dL
Hgb A1c MFr Bld: 6.1 % — ABNORMAL HIGH (ref 4.8–5.6)

## 2020-02-18 ENCOUNTER — Encounter: Payer: Self-pay | Admitting: Family Medicine

## 2020-03-11 ENCOUNTER — Encounter: Payer: Self-pay | Admitting: Family Medicine

## 2020-03-20 DIAGNOSIS — K219 Gastro-esophageal reflux disease without esophagitis: Secondary | ICD-10-CM | POA: Diagnosis not present

## 2020-03-20 DIAGNOSIS — K648 Other hemorrhoids: Secondary | ICD-10-CM | POA: Diagnosis not present

## 2020-03-27 ENCOUNTER — Other Ambulatory Visit: Payer: Self-pay | Admitting: Family Medicine

## 2020-03-27 ENCOUNTER — Other Ambulatory Visit: Payer: Self-pay | Admitting: Physician Assistant

## 2020-03-27 ENCOUNTER — Other Ambulatory Visit: Payer: Self-pay

## 2020-03-27 ENCOUNTER — Ambulatory Visit (INDEPENDENT_AMBULATORY_CARE_PROVIDER_SITE_OTHER): Payer: PPO

## 2020-03-27 DIAGNOSIS — Z1159 Encounter for screening for other viral diseases: Secondary | ICD-10-CM

## 2020-03-27 NOTE — Progress Notes (Signed)
      Patient brought in order from Dr Lyda Jester for a preprocedure COVID PCR Test.  Results will be faxed to [phone] as requested.  Shelle Iron, LPN 44/97/53 0:05 PM

## 2020-03-28 LAB — SARS-COV-2, NAA 2 DAY TAT

## 2020-03-28 LAB — NOVEL CORONAVIRUS, NAA: SARS-CoV-2, NAA: NOT DETECTED

## 2020-04-02 ENCOUNTER — Telehealth: Payer: Self-pay

## 2020-04-02 DIAGNOSIS — K5730 Diverticulosis of large intestine without perforation or abscess without bleeding: Secondary | ICD-10-CM | POA: Diagnosis not present

## 2020-04-02 DIAGNOSIS — Z1211 Encounter for screening for malignant neoplasm of colon: Secondary | ICD-10-CM | POA: Diagnosis not present

## 2020-04-02 LAB — HM COLONOSCOPY

## 2020-04-02 NOTE — Progress Notes (Signed)
° ° °  Chronic Care Management Pharmacy Assistant   Name: Cheryl Beard  MRN: [id number] DOB: December 11, 1955  Reason for Encounter: Medication Review for [facility] delivery    Recent office visits:  02/15/20-PCP no changes, intolerant to Lovaza  Recent consult visits:  None  Hospital visits:  None in previous 6 months  Medications: Outpatient Encounter Medications as of 04/02/2020  Medication Sig   Albuterol Sulfate (PROAIR RESPICLICK) 213 (90 Base) MCG/ACT AEPB Inhale 2 puffs into the lungs as needed (for wheezing).   allopurinol (ZYLOPRIM) 100 MG tablet TAKE 1 TABLET(100 MG) BY MOUTH DAILY   amitriptyline (ELAVIL) 25 MG tablet Take one tablet by mouth at bedtime   aspirin EC 81 MG tablet Take 81 mg by mouth daily.   calcium carbonate (TUMS - DOSED IN MG ELEMENTAL CALCIUM) 500 MG chewable tablet Chew 1 tablet by mouth 2 (two) times daily.   chlorthalidone (HYGROTON) 50 MG tablet TAKE 1 TABLET(50 MG) BY MOUTH DAILY   cholecalciferol (VITAMIN D3) 25 MCG (1000 UNIT) tablet Take 1,000 Units by mouth daily.   EDARBI 80 MG TABS Take 1 tablet (80 mg total) by mouth daily.   metoprolol succinate (TOPROL-XL) 25 MG 24 hr tablet Take 1 tablet (25 mg total) by mouth daily.   metoprolol succinate (TOPROL-XL) 50 MG 24 hr tablet TAKE 1/2 TABLET BY MOUTH EVERY DAY   Multiple Vitamins-Minerals (WOMENS MULTIVITAMIN PLUS PO) Take 1 tablet by mouth every morning.   Omega-3 Fatty Acids (FISH OIL) 1000 MG CAPS Take 2 capsules by mouth daily.   pantoprazole (PROTONIX) 40 MG tablet Take 1 tablet (40 mg total) by mouth daily.   rosuvastatin (CRESTOR) 20 MG tablet TAKE 1 TABLET BY MOUTH EVERY DAY AT NIGHT   No facility-administered encounter medications on file as of 04/02/2020.    Reviewed chart for medication changes ahead of medication coordination call.  No OVs, Consults, or hospital visits since last care coordination call/Pharmacist visit. (If appropriate, list visit date, provider  name)  No medication changes indicated OR if recent visit, treatment plan here.  BP Readings from Last 3 Encounters:  02/15/20 130/72  12/13/19 132/78  11/14/19 110/64    Lab Results  Component Value Date   HGBA1C 6.1 (H) 02/15/2020     Patient obtains medications through Adherence Packaging  90 Days   Last adherence delivery included:  No previous delivery  This will be the first.  Patient declined (meds) last month due to PRN use/additional supply on hand. None  Patient is due for next adherence delivery on: 04/15/20 Called patient and reviewed medications and coordinated delivery.  This delivery to include: Allopurinol 100mg -breakfast Amitriptyline 25mg -bedtime Chlorthalidone 50mg -breakfast Metoprolol ER Succinate 50mg -1/2 at breakfast Rosuvastatin 20mg -bedtime      Patient declined the following medications (meds) due to (reason) Proair inhaler-will call  Pantoprazole-has extra from previous fill   Patient needs refills for: Rosuvastatin 20mg   Confirmed delivery date of 3/21, advised patient that pharmacy will contact them the morning of delivery.  Med Sync form was completed and sent to Donette Larry, CPP for approval  Clarita Leber, [place] Pharmacist Assistant [phone]

## 2020-04-04 ENCOUNTER — Other Ambulatory Visit: Payer: Self-pay | Admitting: Physician Assistant

## 2020-04-04 ENCOUNTER — Other Ambulatory Visit: Payer: Self-pay

## 2020-04-04 MED ORDER — ROSUVASTATIN CALCIUM 20 MG PO TABS: ORAL_TABLET | ORAL | 0 refills | Status: DC

## 2020-04-22 ENCOUNTER — Telehealth: Payer: Self-pay

## 2020-04-22 NOTE — Progress Notes (Signed)
    Chronic Care Management Pharmacy Assistant   Name: Cheryl Beard  MRN: [id number] DOB: Oct 02, 1955   Reason for Encounter: Adherence review     Medications: Outpatient Encounter Medications as of 04/22/2020  Medication Sig  . Albuterol Sulfate (PROAIR RESPICLICK) 973 (90 Base) MCG/ACT AEPB Inhale 2 puffs into the lungs as needed (for wheezing).  [name] allopurinol (ZYLOPRIM) 100 MG tablet TAKE 1 TABLET(100 MG) BY MOUTH DAILY  . amitriptyline (ELAVIL) 25 MG tablet Take one tablet by mouth at bedtime  . aspirin EC 81 MG tablet Take 81 mg by mouth daily.  . calcium carbonate (TUMS - DOSED IN MG ELEMENTAL CALCIUM) 500 MG chewable tablet Chew 1 tablet by mouth 2 (two) times daily.  . chlorthalidone (HYGROTON) 50 MG tablet TAKE 1 TABLET(50 MG) BY MOUTH DAILY  . cholecalciferol (VITAMIN D3) 25 MCG (1000 UNIT) tablet Take 1,000 Units by mouth daily.  [name] EDARBI 80 MG TABS TAKE 1 TABLET BY MOUTH ONCE DAILY  . metoprolol succinate (TOPROL-XL) 25 MG 24 hr tablet Take 1 tablet (25 mg total) by mouth daily.  . metoprolol succinate (TOPROL-XL) 50 MG 24 hr tablet TAKE 1/2 TABLET BY MOUTH EVERY DAY  . Multiple Vitamins-Minerals (WOMENS MULTIVITAMIN PLUS PO) Take 1 tablet by mouth every morning.  . Omega-3 Fatty Acids (FISH OIL) 1000 MG CAPS Take 2 capsules by mouth daily.  . pantoprazole (PROTONIX) 40 MG tablet Take 1 tablet (40 mg total) by mouth daily.  . rosuvastatin (CRESTOR) 20 MG tablet Take 1 tablet by mouth daily   No facility-administered encounter medications on file as of 04/22/2020.    Chart review noted the patient has appointments on: 05/17/20-Dr. [facility], PCP 08/05/20-Sara Owens Shark, [place]     Never  Done TETANUS/TDAP (Every 10 Years)    Oct 27 2018 MAMMOGRAM (Yearly) Last completed: Oct 26, 2017    Medication adherence Total Gaps - All Measures 3  Total Gaps - Star Measures 1 ? Breast Cancer Screening (BCS) Met: Breast Cancer Screening Performed: Normal  Result ? Controlling High Blood Pressure (CBP) 0 ? Diabetes: HbA1c Control <= 9.0% (CDC-A1c) 0 ? Diabetes: Eye exam (retinal) performed (CDC-Eye) 0 ? Diabetes: Medical attention for nephropathy (CDC-Neph) 0 ? Care for Older Adults (COA): Medication Review 0 ? Care for Older Adults (COA): Pain Assessment 0 ? Colorectal Cancer Screening (COL) Not Met: Colorectal Cancer Screening Not Compliant ? Medication Adherence for Cholesterol (Statins) (MAC) Met: Med Compliance CHOL:  90-99% ? Medication Adherence for Diabetes Medications (MAD) 0 ? Medication Adherence for Hypertension (RAS antogoniststs) [facility]) 0 ? Medication Reconciliation Post-Discharge (MRP) 0 ? Osteoporosis Management in Women Who Had a Fracture (OMW) 0 ? Plan All-Cause Readmissions (PCR) 0 ? Statin Therapy for Patients with Cardiovascular Disease (SPC) 0 ? Statin Use in Persons with Diabetes (SUPD) 0 ? Statin Use in Persons with Diabetes (SUPD) 0 Visits: [facility]ness [facility]) Not Met: AWV not performed Visits: Annual Wellness Visit (AWV) Not Met: AWV not performed Controlling High Blood Pressure (CBP) Met: BP < 140/90 Diabetes: HbA1c Testing (CDC-A1c Testing) 0 Diabetes: Eye exam (retinal) performed (CDC-Eye) 0 Care for Older Adults (COA): Medication Review 0 Care for Older Adults (COA): Pain Assessment 0 Plan All-Cause Readmissions (PCR) [place], [place] Pharmacist Assistant [phone]

## 2020-05-06 ENCOUNTER — Telehealth: Payer: Self-pay

## 2020-05-06 NOTE — Progress Notes (Signed)
    Chronic Care Management Pharmacy Assistant   Name: Cheryl Beard  MRN: [id number] DOB: 01/30/1955   Reason for Encounter: Medication coordination for Upstream   Recent office visits:  03/27/20-PCP- Covid test 02/15/20-PCP-No medication changes, follow up 76mos  Recent consult visits:  None  Hospital visits:  None in previous 6 months  Medications: Outpatient Encounter Medications as of 05/06/2020  Medication Sig  . Albuterol Sulfate (PROAIR RESPICLICK) 662 (90 Base) MCG/ACT AEPB Inhale 2 puffs into the lungs as needed (for wheezing).  [name] allopurinol (ZYLOPRIM) 100 MG tablet TAKE 1 TABLET(100 MG) BY MOUTH DAILY  . amitriptyline (ELAVIL) 25 MG tablet Take one tablet by mouth at bedtime  . aspirin EC 81 MG tablet Take 81 mg by mouth daily.  . calcium carbonate (TUMS - DOSED IN MG ELEMENTAL CALCIUM) 500 MG chewable tablet Chew 1 tablet by mouth 2 (two) times daily.  . chlorthalidone (HYGROTON) 50 MG tablet TAKE 1 TABLET(50 MG) BY MOUTH DAILY  . cholecalciferol (VITAMIN D3) 25 MCG (1000 UNIT) tablet Take 1,000 Units by mouth daily.  [name] EDARBI 80 MG TABS TAKE 1 TABLET BY MOUTH ONCE DAILY  . metoprolol succinate (TOPROL-XL) 25 MG 24 hr tablet Take 1 tablet (25 mg total) by mouth daily.  . metoprolol succinate (TOPROL-XL) 50 MG 24 hr tablet TAKE 1/2 TABLET BY MOUTH EVERY DAY  . Multiple Vitamins-Minerals (WOMENS MULTIVITAMIN PLUS PO) Take 1 tablet by mouth every morning.  . Omega-3 Fatty Acids (FISH OIL) 1000 MG CAPS Take 2 capsules by mouth daily.  . pantoprazole (PROTONIX) 40 MG tablet Take 1 tablet (40 mg total) by mouth daily.  . rosuvastatin (CRESTOR) 20 MG tablet Take 1 tablet by mouth daily   No facility-administered encounter medications on file as of 05/06/2020.    Reviewed chart for medication changes ahead of medication coordination call.  No OVs, Consults, or hospital visits since last care coordination call/Pharmacist visit. (If appropriate, list visit date, provider  name)  No medication changes indicated OR if recent visit, treatment plan here.  BP Readings from Last 3 Encounters:  02/15/20 130/72  12/13/19 132/78  11/14/19 110/64    Lab Results  Component Value Date   HGBA1C 6.1 (H) 02/15/2020     Patient obtains medications through Adherence Packaging  90 Days   Last adherence delivery included:  Allopurinol 100mg -breakfast Amitriptyline 25mg -bedtime Chlorthalidone 50mg -breakfast Metoprolol ER Succinate 50mg -1/2 at breakfast Rosuvastatin 20mg -bedtime     Patient declined (meds) last month due to PRN use/additional supply on hand. Proair inhaler-will call  Pantoprazole-has extra from previous fill   Patient is due for next adherence delivery on: No fill needed  Called patient and reviewed medications and coordinated delivery.  This delivery to include: None 90ds filled on 04/12/20  Patient declined the following medications (meds) due to (reason) Allopurinol 100mg -breakfast Amitriptyline 25mg -bedtime Chlorthalidone 50mg -breakfast Metoprolol ER Succinate 50mg -1/2 at breakfast Rosuvastatin 20mg -bedtime     Patient needs refills for  None  Confirmed delivery date of no fill needed, advised patient that pharmacy will contact them the morning of delivery.  I have completed a no fill needed form and sent it to Donette Larry, [place], [place] Pharmacist Assistant [phone]

## 2020-05-17 ENCOUNTER — Ambulatory Visit (INDEPENDENT_AMBULATORY_CARE_PROVIDER_SITE_OTHER): Payer: PPO | Admitting: Family Medicine

## 2020-05-17 ENCOUNTER — Other Ambulatory Visit: Payer: Self-pay

## 2020-05-17 VITALS — BP 120/60 | HR 64 | Temp 97.5°F | Resp 16 | Ht 62.0 in | Wt 267.0 lb

## 2020-05-17 DIAGNOSIS — I119 Hypertensive heart disease without heart failure: Secondary | ICD-10-CM | POA: Diagnosis not present

## 2020-05-17 DIAGNOSIS — M79642 Pain in left hand: Secondary | ICD-10-CM | POA: Diagnosis not present

## 2020-05-17 DIAGNOSIS — R5383 Other fatigue: Secondary | ICD-10-CM

## 2020-05-17 DIAGNOSIS — M797 Fibromyalgia: Secondary | ICD-10-CM

## 2020-05-17 DIAGNOSIS — E782 Mixed hyperlipidemia: Secondary | ICD-10-CM | POA: Diagnosis not present

## 2020-05-17 DIAGNOSIS — K219 Gastro-esophageal reflux disease without esophagitis: Secondary | ICD-10-CM

## 2020-05-17 DIAGNOSIS — Z6841 Body Mass Index (BMI) 40.0 and over, adult: Secondary | ICD-10-CM

## 2020-05-17 DIAGNOSIS — R7303 Prediabetes: Secondary | ICD-10-CM | POA: Diagnosis not present

## 2020-05-17 NOTE — Progress Notes (Signed)
Subjective:  Patient ID: Cheryl Beard, female    DOB: 1955/02/03  Age: 65 y.o. MRN: [id number]  Chief Complaint  Patient presents with  . Hyperlipidemia  . Hypertension  . Morbid obesity    HPI Patient is a 65 yo female presents CAD, HTN, Prediabetes, fibromyalgia. Pt is eating healthy and exercising some.  For her blood pressure she is on Toprol-XL 25 mg once daily, Edarbi 80 mg once daily, chlorthalidone 50 mg once daily.  For her cholesterol she takes Crestor 20 mg once daily Fish oil 1000 mg 2 capsules daily, aspirin 81 mg once daily.  For fibromyalgia she is on amitriptyline 25 mg once at night.  Patient has coronary artery disease and is on aspirin, metoprolol and Crestor and fish oil for this.  For GERD she takes Protonix 40 mg once daily.  She is also on allopurinol for gout prophylaxis.    Current Outpatient Medications on File Prior to Visit  Medication Sig Dispense Refill  . Albuterol Sulfate (PROAIR RESPICLICK) 301 (90 Base) MCG/ACT AEPB Inhale 2 puffs into the lungs as needed (for wheezing). 1 each 2  . allopurinol (ZYLOPRIM) 100 MG tablet TAKE 1 TABLET(100 MG) BY MOUTH DAILY 90 tablet 1  . amitriptyline (ELAVIL) 25 MG tablet Take one tablet by mouth at bedtime 90 tablet 1  . aspirin EC 81 MG tablet Take 81 mg by mouth daily.    . calcium carbonate (TUMS - DOSED IN MG ELEMENTAL CALCIUM) 500 MG chewable tablet Chew 1 tablet by mouth 2 (two) times daily.    . chlorthalidone (HYGROTON) 50 MG tablet TAKE 1 TABLET(50 MG) BY MOUTH DAILY 90 tablet 1  . cholecalciferol (VITAMIN D3) 25 MCG (1000 UNIT) tablet Take 1,000 Units by mouth daily.    [name] EDARBI 80 MG TABS TAKE 1 TABLET BY MOUTH ONCE DAILY 90 tablet 1  . metoprolol succinate (TOPROL-XL) 25 MG 24 hr tablet Take 1 tablet (25 mg total) by mouth daily. 90 tablet 3  . Multiple Vitamins-Minerals (WOMENS MULTIVITAMIN PLUS PO) Take 1 tablet by mouth every morning.    . Omega-3 Fatty Acids (FISH OIL) 1000 MG CAPS Take 2 capsules  by mouth daily.    . pantoprazole (PROTONIX) 40 MG tablet Take 1 tablet (40 mg total) by mouth daily. 90 tablet 3  . rosuvastatin (CRESTOR) 20 MG tablet Take 1 tablet by mouth daily 90 tablet 0   No current facility-administered medications on file prior to visit.   Past Medical History:  Diagnosis Date  . Allergy   . CAD (coronary artery disease)   . Depression   . Erosive esophagitis   . Erosive gastritis   . Gallbladder disease   . GERD (gastroesophageal reflux disease)   . History of Clostridium difficile infection   . History of COVID-19   . History of myocardial infarction   . Hyperlipidemia   . Hypertension   . Hypertensive urgency   . Idiopathic gout   . Myocardial infarction ([place])   . Nontoxic multinodular goiter   . Other asthma   . Vitamin D deficiency    Past Surgical History:  Procedure Laterality Date  . ABDOMINAL HYSTERECTOMY    . APPENDECTOMY    . CHOLECYSTECTOMY    . ROTATOR CUFF REPAIR      Family History  Problem Relation Age of Onset  . Cancer Mother   . Parkinson's disease Mother   . Diabetes Mother   . Hypertension Mother   . Stroke Father   .  Hyperlipidemia Sister   . Hypertension Sister   . Alzheimer's disease Maternal Grandmother   . Fibromyalgia Sister   . Cancer Sister   . Cancer Paternal Grandmother        Leukemia   Social History   Socioeconomic History  . Marital status: Married    Spouse name: Not on file  . Number of children: 3  . Years of education: Not on file  . Highest education level: Not on file  Occupational History  . Not on file  Tobacco Use  . Smoking status: Former Smoker    Quit date: 2002    Years since quitting: 20.3  . Smokeless tobacco: Never Used  Substance and Sexual Activity  . Alcohol use: Never  . Drug use: Never  . Sexual activity: Not on file  Other Topics Concern  . Not on file  Social History Narrative  . Not on file   Social Determinants of Health   Financial Resource Strain: Not on  file  Food Insecurity: No Food Insecurity  . Worried About Charity fundraiser in the Last Year: Never true  . Ran Out of Food in the Last Year: Never true  Transportation Needs: No Transportation Needs  . Lack of Transportation (Medical): No  . Lack of Transportation (Non-Medical): No  Physical Activity: Not on file  Stress: Not on file  Social Connections: Not on file    Review of Systems  Constitutional: Positive for fatigue. Negative for chills and fever.  HENT: Negative for congestion, ear pain and sore throat.   Respiratory: Negative for cough and shortness of breath.   Cardiovascular: Negative for chest pain and palpitations.       Left axilla sharp pain, but can last few minutes.   Gastrointestinal: Negative for abdominal pain, constipation, diarrhea, nausea and vomiting.  Genitourinary: Negative for dysuria and urgency.  Musculoskeletal: Positive for arthralgias (left hand. Fell twice in the last 6 months. ). Negative for myalgias.  Skin: Negative for rash.  Neurological: Negative for dizziness and headaches.  Psychiatric/Behavioral: Negative for dysphoric mood. The patient is not nervous/anxious.      Objective:  BP 120/60   Pulse 64   Temp (!) 97.5 F (36.4 C)   Resp 16   Ht 5\' 2"  (1.575 m)   Wt 267 lb (121.1 kg)   BMI 48.83 kg/m   BP/Weight 05/17/2020 02/15/2020 54/62/7035  Systolic BP 009 381 829  Diastolic BP 60 72 78  Wt. (Lbs) 267 267 264.6  BMI 48.83 48.83 48.4    Physical Exam Vitals reviewed.  Constitutional:      Appearance: Normal appearance. She is obese.  Neck:     Vascular: No carotid bruit.  Cardiovascular:     Rate and Rhythm: Normal rate and regular rhythm.     Pulses: Normal pulses.     Heart sounds: Normal heart sounds.  Pulmonary:     Effort: Pulmonary effort is normal. No respiratory distress.     Breath sounds: Normal breath sounds.  Abdominal:     General: Abdomen is flat. Bowel sounds are normal.     Palpations: Abdomen is  soft.     Tenderness: There is no abdominal tenderness.  Musculoskeletal:        General: Tenderness (left hand edema over posterior. mild tenderness. No joint deformities.. rt hand is normal. ) present.  Neurological:     Mental Status: She is alert and oriented to person, place, and time.  Psychiatric:  Mood and Affect: Mood normal.        Behavior: Behavior normal.     Diabetic Foot Exam - Simple   No data filed      Lab Results  Component Value Date   WBC 8.3 02/15/2020   HGB 13.5 02/15/2020   HCT 40.8 02/15/2020   PLT 218 02/15/2020   GLUCOSE 109 (H) 02/15/2020   CHOL 129 02/15/2020   TRIG 214 (H) 02/15/2020   HDL 39 (L) 02/15/2020   LDLCALC 55 02/15/2020   ALT 38 (H) 02/15/2020   AST 34 02/15/2020   NA 138 02/15/2020   K 4.6 02/15/2020   CL 97 02/15/2020   CREATININE 1.05 (H) 02/15/2020   BUN 23 02/15/2020   CO2 26 02/15/2020   TSH 3.130 05/10/2019   HGBA1C 6.1 (H) 02/15/2020      Assessment & Plan:   1. Mixed hyperlipidemia Low-fat diet and exercise recommended.  Continue Crestor. - Lipid panel  2. Hypertensive heart disease without heart failure Well-controlled.  Continue current medications. Low-fat diet and exercise recommended. Keep follow-up appointments with cardiology. - CBC with Differential/Platelet - Comprehensive metabolic panel  3. GERD without esophagitis Well-controlled on pantoprazole.    4. Fibromyalgia Fair control with amitriptyline.  Recommend exercise.  5. Prediabetes Low sugar diet. - Hemoglobin A1c  6. Morbid obesity with BMI of 45.0-49.9, adult (HCC) Low-fat, low sugar, low salt diet. Exercise recommended. 7. Other fatigue - TSH - B12 and Folate Panel - Methylmalonic acid, serum  8. Left hand pain Ibuprofen recommended  No orders of the defined types were placed in this encounter.   Orders Placed This Encounter  Procedures  . CBC with Differential/Platelet  . Comprehensive metabolic panel  .  Hemoglobin A1c  . Lipid panel  . TSH  . B12 and Folate Panel  . Methylmalonic acid, serum     Follow-up: Return in about 4 months (around 09/16/2020) for fasting.  An After Visit Summary was printed and given to the patient.  [name], MD [facility] [phone]

## 2020-05-19 ENCOUNTER — Encounter: Payer: Self-pay | Admitting: Family Medicine

## 2020-05-26 LAB — B12 AND FOLATE PANEL
Folate: 11.2 ng/mL (ref >3.0)
Vitamin B-12: 472 pg/mL (ref 232–1245)

## 2020-05-26 LAB — COMPREHENSIVE METABOLIC PANEL
ALT: 90 IU/L — ABNORMAL HIGH (ref 0–32)
AST: 65 IU/L — ABNORMAL HIGH (ref 0–40)
Albumin/Globulin Ratio: 1.6 (ref 1.2–2.2)
Albumin: 4.3 g/dL (ref 3.8–4.8)
Alkaline Phosphatase: 91 IU/L (ref 44–121)
BUN/Creatinine Ratio: 22 (ref 12–28)
BUN: 24 mg/dL (ref 8–27)
Bilirubin Total: 0.3 mg/dL (ref 0.0–1.2)
CO2: 24 mmol/L (ref 20–29)
Calcium: 9.6 mg/dL (ref 8.7–10.3)
Chloride: 99 mmol/L (ref 96–106)
Creatinine, Ser: 1.07 mg/dL — ABNORMAL HIGH (ref 0.57–1.00)
Globulin, Total: 2.7 g/dL (ref 1.5–4.5)
Glucose: 114 mg/dL — ABNORMAL HIGH (ref 65–99)
Potassium: 4.9 mmol/L (ref 3.5–5.2)
Sodium: 138 mmol/L (ref 134–144)
Total Protein: 7 g/dL (ref 6.0–8.5)
eGFR: 57 mL/min/{1.73_m2} — ABNORMAL LOW (ref >59)

## 2020-05-26 LAB — LIPID PANEL
Chol/HDL Ratio: 3.5 ratio (ref 0.0–4.4)
Cholesterol, Total: 132 mg/dL (ref 100–199)
HDL: 38 mg/dL — ABNORMAL LOW (ref >39)
LDL Chol Calc (NIH): 60 mg/dL (ref 0–99)
Triglycerides: 206 mg/dL — ABNORMAL HIGH (ref 0–149)
VLDL Cholesterol Cal: 34 mg/dL (ref 5–40)

## 2020-05-26 LAB — METHYLMALONIC ACID, SERUM: Methylmalonic Acid: 183 nmol/L (ref 0–378)

## 2020-05-26 LAB — CBC WITH DIFFERENTIAL/PLATELET
Basophils Absolute: 0.1 10*3/uL (ref 0.0–0.2)
Basos: 1 %
EOS (ABSOLUTE): 0.1 10*3/uL (ref 0.0–0.4)
Eos: 1 %
Hematocrit: 36.7 % (ref 34.0–46.6)
Hemoglobin: 12.8 g/dL (ref 11.1–15.9)
Immature Grans (Abs): 0 10*3/uL (ref 0.0–0.1)
Immature Granulocytes: 0 %
Lymphocytes Absolute: 3 10*3/uL (ref 0.7–3.1)
Lymphs: 43 %
MCH: 32 pg (ref 26.6–33.0)
MCHC: 34.9 g/dL (ref 31.5–35.7)
MCV: 92 fL (ref 79–97)
Monocytes Absolute: 0.5 10*3/uL (ref 0.1–0.9)
Monocytes: 7 %
Neutrophils Absolute: 3.2 10*3/uL (ref 1.4–7.0)
Neutrophils: 48 %
Platelets: 202 10*3/uL (ref 150–450)
RBC: 4 x10E6/uL (ref 3.77–5.28)
RDW: 13.4 % (ref 11.7–15.4)
WBC: 6.8 10*3/uL (ref 3.4–10.8)

## 2020-05-26 LAB — HEMOGLOBIN A1C
Est. average glucose Bld gHb Est-mCnc: 114 mg/dL
Hgb A1c MFr Bld: 5.6 % (ref 4.8–5.6)

## 2020-05-26 LAB — CARDIOVASCULAR RISK ASSESSMENT

## 2020-05-26 LAB — TSH: TSH: 4.4 u[IU]/mL (ref 0.450–4.500)

## 2020-05-29 ENCOUNTER — Other Ambulatory Visit: Payer: Self-pay

## 2020-05-29 DIAGNOSIS — R748 Abnormal levels of other serum enzymes: Secondary | ICD-10-CM

## 2020-06-04 ENCOUNTER — Encounter: Payer: Self-pay | Admitting: Family Medicine

## 2020-06-04 ENCOUNTER — Ambulatory Visit (INDEPENDENT_AMBULATORY_CARE_PROVIDER_SITE_OTHER): Payer: PPO | Admitting: Family Medicine

## 2020-06-04 ENCOUNTER — Other Ambulatory Visit: Payer: Self-pay

## 2020-06-04 VITALS — BP 124/72 | HR 84 | Temp 97.5°F | Resp 16 | Ht 62.0 in | Wt 264.0 lb

## 2020-06-04 DIAGNOSIS — R748 Abnormal levels of other serum enzymes: Secondary | ICD-10-CM

## 2020-06-04 DIAGNOSIS — M79642 Pain in left hand: Secondary | ICD-10-CM | POA: Diagnosis not present

## 2020-06-04 DIAGNOSIS — R945 Abnormal results of liver function studies: Secondary | ICD-10-CM | POA: Diagnosis not present

## 2020-06-04 DIAGNOSIS — M79645 Pain in left finger(s): Secondary | ICD-10-CM | POA: Diagnosis not present

## 2020-06-04 MED ORDER — PREDNISONE 50 MG PO TABS: 50.0000 mg | ORAL_TABLET | Freq: Every day | ORAL | 0 refills | Status: DC

## 2020-06-04 NOTE — Progress Notes (Signed)
Acute Office Visit  Subjective:    Patient ID: Cheryl Beard, female    DOB: 05-05-1955, 65 y.o.   MRN: [id number]  Chief Complaint  Patient presents with  . Hand Pain    Left      HPI Patient is in today for left hand pain. Swelling. Pain has been present x 5 months, but much worse in the next 3 days. She had fallen in 12/2019. Left hand is a throbbing pain. 10/10. Tried ibuprofen and advil and did not help. Tylenol did not help.  No fever.   Past Medical History:  Diagnosis Date  . Allergy   . CAD (coronary artery disease)   . Depression   . Erosive esophagitis   . Erosive gastritis   . Gallbladder disease   . GERD (gastroesophageal reflux disease)   . History of Clostridium difficile infection   . History of COVID-19   . History of myocardial infarction   . Hyperlipidemia   . Hypertension   . Hypertensive urgency   . Idiopathic gout   . Myocardial infarction ([place])   . Nontoxic multinodular goiter   . Other asthma   . Vitamin D deficiency     Past Surgical History:  Procedure Laterality Date  . ABDOMINAL HYSTERECTOMY    . APPENDECTOMY    . CHOLECYSTECTOMY    . ROTATOR CUFF REPAIR      Family History  Problem Relation Age of Onset  . Cancer Mother   . Parkinson's disease Mother   . Diabetes Mother   . Hypertension Mother   . Stroke Father   . Hyperlipidemia Sister   . Hypertension Sister   . Alzheimer's disease Maternal Grandmother   . Fibromyalgia Sister   . Cancer Sister   . Cancer Paternal Grandmother        Leukemia    Social History   Socioeconomic History  . Marital status: Married    Spouse name: Not on file  . Number of children: 3  . Years of education: Not on file  . Highest education level: Not on file  Occupational History  . Not on file  Tobacco Use  . Smoking status: Former Smoker    Quit date: 2002    Years since quitting: 20.3  . Smokeless tobacco: Never Used  Substance and Sexual Activity  . Alcohol use: Never  .  Drug use: Never  . Sexual activity: Not on file  Other Topics Concern  . Not on file  Social History Narrative  . Not on file   Social Determinants of Health   Financial Resource Strain: Not on file  Food Insecurity: No Food Insecurity  . Worried About Charity fundraiser in the Last Year: Never true  . Ran Out of Food in the Last Year: Never true  Transportation Needs: No Transportation Needs  . Lack of Transportation (Medical): No  . Lack of Transportation (Non-Medical): No  Physical Activity: Not on file  Stress: Not on file  Social Connections: Not on file  Intimate Partner Violence: Not on file    Outpatient Medications Prior to Visit  Medication Sig Dispense Refill  . Albuterol Sulfate (PROAIR RESPICLICK) 025 (90 Base) MCG/ACT AEPB Inhale 2 puffs into the lungs as needed (for wheezing). 1 each 2  . allopurinol (ZYLOPRIM) 100 MG tablet TAKE 1 TABLET(100 MG) BY MOUTH DAILY 90 tablet 1  . amitriptyline (ELAVIL) 25 MG tablet Take one tablet by mouth at bedtime 90 tablet 1  . aspirin  EC 81 MG tablet Take 81 mg by mouth daily.    . calcium carbonate (TUMS - DOSED IN MG ELEMENTAL CALCIUM) 500 MG chewable tablet Chew 1 tablet by mouth 2 (two) times daily.    . chlorthalidone (HYGROTON) 50 MG tablet TAKE 1 TABLET(50 MG) BY MOUTH DAILY 90 tablet 1  . cholecalciferol (VITAMIN D3) 25 MCG (1000 UNIT) tablet Take 1,000 Units by mouth daily.    [name] EDARBI 80 MG TABS TAKE 1 TABLET BY MOUTH ONCE DAILY 90 tablet 1  . metoprolol succinate (TOPROL-XL) 25 MG 24 hr tablet Take 1 tablet (25 mg total) by mouth daily. 90 tablet 3  . Multiple Vitamins-Minerals (WOMENS MULTIVITAMIN PLUS PO) Take 1 tablet by mouth every morning.    . Omega-3 Fatty Acids (FISH OIL) 1000 MG CAPS Take 2 capsules by mouth daily.    . pantoprazole (PROTONIX) 40 MG tablet Take 1 tablet (40 mg total) by mouth daily. 90 tablet 3  . rosuvastatin (CRESTOR) 20 MG tablet Take 1 tablet by mouth daily 90 tablet 0   No  facility-administered medications prior to visit.    Allergies  Allergen Reactions  . Candesartan   . Clarithromycin   . Colesevelam   . Duloxetine   . Lovaza [Omega-3-Acid Ethyl Esters] Nausea Only    GI upset  . Pregabalin     Review of Systems     Objective:    Physical Exam Vitals reviewed.  Constitutional:      Appearance: Normal appearance.  Musculoskeletal:        General: Swelling (left posterior hand proximal to 2nd and 3rd digits. nontender over pip and dips. no increased warmth or erythema. ) present.  Neurological:     Mental Status: She is alert.     BP 124/72   Pulse 84   Temp (!) 97.5 F (36.4 C)   Resp 16   Ht $R'5\' 2"'GP$  (1.575 m)   Wt 264 lb (119.7 kg)   BMI 48.29 kg/m  Wt Readings from Last 3 Encounters:  06/04/20 264 lb (119.7 kg)  05/17/20 267 lb (121.1 kg)  02/15/20 267 lb (121.1 kg)    Health Maintenance Due  Topic Date Due  . Hepatitis C Screening  Never done  . TETANUS/TDAP  Never done  . MAMMOGRAM  10/27/2018    There are no preventive care reminders to display for this patient.   Lab Results  Component Value Date   TSH 4.400 05/17/2020   Lab Results  Component Value Date   WBC 6.8 05/17/2020   HGB 12.8 05/17/2020   HCT 36.7 05/17/2020   MCV 92 05/17/2020   PLT 202 05/17/2020   Lab Results  Component Value Date   NA 138 05/17/2020   K 4.9 05/17/2020   CO2 24 05/17/2020   GLUCOSE 114 (H) 05/17/2020   BUN 24 05/17/2020   CREATININE 1.07 (H) 05/17/2020   BILITOT 0.3 05/17/2020   ALKPHOS 91 05/17/2020   AST 65 (H) 05/17/2020   ALT 90 (H) 05/17/2020   PROT 7.0 05/17/2020   ALBUMIN 4.3 05/17/2020   CALCIUM 9.6 05/17/2020   EGFR 57 (L) 05/17/2020   Lab Results  Component Value Date   CHOL 132 05/17/2020   Lab Results  Component Value Date   HDL 38 (L) 05/17/2020   Lab Results  Component Value Date   LDLCALC 60 05/17/2020   Lab Results  Component Value Date   TRIG 206 (H) 05/17/2020   Lab Results   Component  Value Date   CHOLHDL 3.5 05/17/2020   Lab Results  Component Value Date   HGBA1C 5.6 05/17/2020       Assessment & Plan:  1. Left hand pain - DG Hand Complete Left - Comprehensive metabolic panel - Sedimentation rate - Uric acid - Rheumatoid factor - C-reactive protein - predniSONE (DELTASONE) 50 MG tablet; Take 1 tablet (50 mg total) by mouth daily with breakfast.  Dispense: 5 tablet; Refill: 0 - CBC with Differential/Platelet   2. elevated LFTs.  Check CMP.   Meds ordered this encounter  Medications  . predniSONE (DELTASONE) 50 MG tablet    Sig: Take 1 tablet (50 mg total) by mouth daily with breakfast.    Dispense:  5 tablet    Refill:  0    Orders Placed This Encounter  Procedures  . DG Hand Complete Left  . Comprehensive metabolic panel  . Sedimentation rate  . Uric acid  . Rheumatoid factor  . C-reactive protein  . CBC with Differential/Platelet     Follow-up: No follow-ups on file.  An After Visit Summary was printed and given to the patient.  [name], MD [facility] [phone]

## 2020-06-05 LAB — CBC WITH DIFFERENTIAL/PLATELET
Basophils Absolute: 0 10*3/uL (ref 0.0–0.2)
Basos: 0 %
EOS (ABSOLUTE): 0.1 10*3/uL (ref 0.0–0.4)
Eos: 1 %
Hematocrit: 39.9 % (ref 34.0–46.6)
Hemoglobin: 13.4 g/dL (ref 11.1–15.9)
Immature Grans (Abs): 0 10*3/uL (ref 0.0–0.1)
Immature Granulocytes: 0 %
Lymphocytes Absolute: 2.6 10*3/uL (ref 0.7–3.1)
Lymphs: 26 %
MCH: 30.7 pg (ref 26.6–33.0)
MCHC: 33.6 g/dL (ref 31.5–35.7)
MCV: 92 fL (ref 79–97)
Monocytes Absolute: 0.8 10*3/uL (ref 0.1–0.9)
Monocytes: 8 %
Neutrophils Absolute: 6.2 10*3/uL (ref 1.4–7.0)
Neutrophils: 65 %
Platelets: 214 10*3/uL (ref 150–450)
RBC: 4.36 x10E6/uL (ref 3.77–5.28)
RDW: 13.1 % (ref 11.7–15.4)
WBC: 9.7 10*3/uL (ref 3.4–10.8)

## 2020-06-05 LAB — COMPREHENSIVE METABOLIC PANEL
ALT: 72 IU/L — ABNORMAL HIGH (ref 0–32)
AST: 46 IU/L — ABNORMAL HIGH (ref 0–40)
Albumin/Globulin Ratio: 1.8 (ref 1.2–2.2)
Albumin: 4.4 g/dL (ref 3.8–4.8)
Alkaline Phosphatase: 107 IU/L (ref 44–121)
BUN/Creatinine Ratio: 16 (ref 12–28)
BUN: 19 mg/dL (ref 8–27)
Bilirubin Total: 0.2 mg/dL (ref 0.0–1.2)
CO2: 25 mmol/L (ref 20–29)
Calcium: 9.3 mg/dL (ref 8.7–10.3)
Chloride: 99 mmol/L (ref 96–106)
Creatinine, Ser: 1.22 mg/dL — ABNORMAL HIGH (ref 0.57–1.00)
Globulin, Total: 2.4 g/dL (ref 1.5–4.5)
Glucose: 112 mg/dL — ABNORMAL HIGH (ref 65–99)
Potassium: 4.6 mmol/L (ref 3.5–5.2)
Sodium: 138 mmol/L (ref 134–144)
Total Protein: 6.8 g/dL (ref 6.0–8.5)
eGFR: 49 mL/min/{1.73_m2} — ABNORMAL LOW (ref >59)

## 2020-06-05 LAB — C-REACTIVE PROTEIN: CRP: 12 mg/L — ABNORMAL HIGH (ref 0–10)

## 2020-06-05 LAB — RHEUMATOID FACTOR: Rheumatoid fact SerPl-aCnc: 16.9 IU/mL — ABNORMAL HIGH (ref <14.0)

## 2020-06-05 LAB — URIC ACID: Uric Acid: 8.2 mg/dL — ABNORMAL HIGH (ref 3.0–7.2)

## 2020-06-05 LAB — SEDIMENTATION RATE: Sed Rate: 15 mm/hr (ref 0–40)

## 2020-06-07 ENCOUNTER — Other Ambulatory Visit: Payer: Self-pay

## 2020-06-07 DIAGNOSIS — M79642 Pain in left hand: Secondary | ICD-10-CM

## 2020-06-07 DIAGNOSIS — N289 Disorder of kidney and ureter, unspecified: Secondary | ICD-10-CM

## 2020-06-07 MED ORDER — ALLOPURINOL 300 MG PO TABS: 300.0000 mg | ORAL_TABLET | Freq: Every day | ORAL | 0 refills | Status: DC

## 2020-06-10 LAB — ACUTE VIRAL HEPATITIS (HAV, HBV, HCV)
HCV Ab: 0.1 s/co ratio (ref 0.0–0.9)
Hep A IgM: NEGATIVE
Hep B C IgM: NEGATIVE
Hepatitis B Surface Ag: NEGATIVE

## 2020-06-10 LAB — SPECIMEN STATUS REPORT

## 2020-06-10 LAB — HCV INTERPRETATION

## 2020-06-11 ENCOUNTER — Other Ambulatory Visit: Payer: Self-pay | Admitting: Physician Assistant

## 2020-06-12 ENCOUNTER — Other Ambulatory Visit: Payer: PPO

## 2020-06-14 DIAGNOSIS — H524 Presbyopia: Secondary | ICD-10-CM | POA: Diagnosis not present

## 2020-06-27 DIAGNOSIS — Z6841 Body Mass Index (BMI) 40.0 and over, adult: Secondary | ICD-10-CM | POA: Diagnosis not present

## 2020-06-27 DIAGNOSIS — R768 Other specified abnormal immunological findings in serum: Secondary | ICD-10-CM | POA: Diagnosis not present

## 2020-06-27 DIAGNOSIS — E790 Hyperuricemia without signs of inflammatory arthritis and tophaceous disease: Secondary | ICD-10-CM | POA: Diagnosis not present

## 2020-06-27 DIAGNOSIS — M79642 Pain in left hand: Secondary | ICD-10-CM | POA: Diagnosis not present

## 2020-06-27 DIAGNOSIS — R7989 Other specified abnormal findings of blood chemistry: Secondary | ICD-10-CM | POA: Diagnosis not present

## 2020-07-01 ENCOUNTER — Other Ambulatory Visit: Payer: Self-pay | Admitting: Family Medicine

## 2020-07-02 ENCOUNTER — Telehealth: Payer: Self-pay

## 2020-07-02 ENCOUNTER — Other Ambulatory Visit: Payer: Self-pay

## 2020-07-02 MED ORDER — ROSUVASTATIN CALCIUM 20 MG PO TABS: ORAL_TABLET | ORAL | 1 refills | Status: DC

## 2020-07-02 NOTE — Progress Notes (Signed)
Chronic Care Management Pharmacy Assistant   Name: Cheryl Beard  MRN: [id number] DOB: [birth year]  Reason for Encounter:Medication coordination for Upstream  Recent office visits:  06/07/20-Labs ordered, expected by 07/08/20 for abnormal renal function  06/04/20-Dr. Cox PCP, hand pain, start Prednisone, Blood count normal.  Kidney dysfunction worsened. Recommend hydrate. No nsaids.  Repeat in 1 month.  Liver abnormal, but improved. Add on hepatitis panel as it has not been checked since 2016. Uric acid elevated. Recommend increase allopurinol to 300 mg once daily.  Rheumatoid factor elevated slightly.  CRP elevated slightly. (general inflammatory marker) ESR normal. (general inflammatory marker) Refer to rheumatology    Recent consult visits:  none  Hospital visits:  None in previous 6 months  Medications: Outpatient Encounter Medications as of 07/02/2020  Medication Sig  . Albuterol Sulfate (PROAIR RESPICLICK) 924 (90 Base) MCG/ACT AEPB Inhale 2 puffs into the lungs as needed (for wheezing).  [name] allopurinol (ZYLOPRIM) 300 MG tablet Take 1 tablet (300 mg total) by mouth daily.  [name] amitriptyline (ELAVIL) 25 MG tablet Take one tablet by mouth at bedtime  . aspirin EC 81 MG tablet Take 81 mg by mouth daily.  . calcium carbonate (TUMS - DOSED IN MG ELEMENTAL CALCIUM) 500 MG chewable tablet Chew 1 tablet by mouth 2 (two) times daily.  . chlorthalidone (HYGROTON) 50 MG tablet TAKE ONE TABLET BY MOUTH DAILY  . cholecalciferol (VITAMIN D3) 25 MCG (1000 UNIT) tablet Take 1,000 Units by mouth daily.  [name] EDARBI 80 MG TABS TAKE 1 TABLET BY MOUTH ONCE DAILY  . metoprolol succinate (TOPROL-XL) 25 MG 24 hr tablet Take 1 tablet (25 mg total) by mouth daily.  . Multiple Vitamins-Minerals (WOMENS MULTIVITAMIN PLUS PO) Take 1 tablet by mouth every morning.  . Omega-3 Fatty Acids (FISH OIL) 1000 MG CAPS Take 2 capsules by mouth daily.  . pantoprazole (PROTONIX) 40 MG tablet Take 1 tablet (40 mg  total) by mouth daily.  . predniSONE (DELTASONE) 50 MG tablet Take 1 tablet (50 mg total) by mouth daily with breakfast.  . rosuvastatin (CRESTOR) 20 MG tablet Take 1 tablet by mouth daily   No facility-administered encounter medications on file as of 07/02/2020.    Reviewed chart for medication changes ahead of medication coordination call.  No OVs, Consults, or hospital visits since last care coordination call/Pharmacist visit. (If appropriate, list visit date, provider name)  No medication changes indicated OR if recent visit, treatment plan here.  BP Readings from Last 3 Encounters:  06/04/20 124/72  05/17/20 120/60  02/15/20 130/72    Lab Results  Component Value Date   HGBA1C 5.6 05/17/2020     Patient obtains medications through Adherence Packaging  90 Days   Last adherence delivery included:  Allopurinol 132m-breakfast Amitriptyline 290mbedtime Chlorthalidone 5067mreakfast Metoprolol ER Succinate 56m67m2 at breakfast Rosuvastatin 20mg75mtime  Patient declined (meds) last month due to PRN use/additional supply on hand. Proair inhaler-will call  Pantoprazole-has extra from previous fill   Patient is due for next adherence delivery on: 07/12/20 Called patient and reviewed medications and coordinated delivery.  This delivery to include: Allopurinol 300mg-40mkfast Amitriptyline 25mg-b61mme Chlorthalidone 56mg-br80mast Metoprolol ER Succinate 56mg-1/267mbreakfast Rosuvastatin 20mg-bedt21mPantoprazole 40mg-suppe66mPatient declined the following medications (meds) due to (reason) None  Short fill: Patient had a change to Allopurinol to 300mg, she i72mw out and needs medication,  I will submit an acute fill form for delivery on 07/03/20  Patient needs refills for  Rosuvastatin  Confirmed delivery date of 07/12/20, advised patient that pharmacy will contact them the morning of delivery.  Clarita Leber, [place] Pharmacist  Assistant [phone]

## 2020-07-05 ENCOUNTER — Other Ambulatory Visit: Payer: PPO

## 2020-07-05 DIAGNOSIS — N289 Disorder of kidney and ureter, unspecified: Secondary | ICD-10-CM | POA: Diagnosis not present

## 2020-07-06 LAB — COMPREHENSIVE METABOLIC PANEL
ALT: 68 IU/L — ABNORMAL HIGH (ref 0–32)
AST: 57 IU/L — ABNORMAL HIGH (ref 0–40)
Albumin/Globulin Ratio: 1.7 (ref 1.2–2.2)
Albumin: 4.3 g/dL (ref 3.8–4.8)
Alkaline Phosphatase: 91 IU/L (ref 44–121)
BUN/Creatinine Ratio: 22 (ref 12–28)
BUN: 23 mg/dL (ref 8–27)
Bilirubin Total: 0.2 mg/dL (ref 0.0–1.2)
CO2: 25 mmol/L (ref 20–29)
Calcium: 9.4 mg/dL (ref 8.7–10.3)
Chloride: 98 mmol/L (ref 96–106)
Creatinine, Ser: 1.04 mg/dL — ABNORMAL HIGH (ref 0.57–1.00)
Globulin, Total: 2.5 g/dL (ref 1.5–4.5)
Glucose: 116 mg/dL — ABNORMAL HIGH (ref 65–99)
Potassium: 4.4 mmol/L (ref 3.5–5.2)
Sodium: 139 mmol/L (ref 134–144)
Total Protein: 6.8 g/dL (ref 6.0–8.5)
eGFR: 59 mL/min/{1.73_m2} — ABNORMAL LOW (ref >59)

## 2020-07-25 DIAGNOSIS — M79642 Pain in left hand: Secondary | ICD-10-CM | POA: Diagnosis not present

## 2020-07-25 DIAGNOSIS — Z6841 Body Mass Index (BMI) 40.0 and over, adult: Secondary | ICD-10-CM | POA: Diagnosis not present

## 2020-07-25 DIAGNOSIS — R7989 Other specified abnormal findings of blood chemistry: Secondary | ICD-10-CM | POA: Diagnosis not present

## 2020-07-25 DIAGNOSIS — E790 Hyperuricemia without signs of inflammatory arthritis and tophaceous disease: Secondary | ICD-10-CM | POA: Diagnosis not present

## 2020-07-25 DIAGNOSIS — R768 Other specified abnormal immunological findings in serum: Secondary | ICD-10-CM | POA: Diagnosis not present

## 2020-07-26 NOTE — Progress Notes (Signed)
Chronic Care Management Pharmacy Note  08/06/2020 Name:  Cheryl Beard MRN:  [id number] DOB:  01/13/1956  Summary: Patient unable to tolerate fish oil 2 capsules twice daily due to increased burping/fishy taste. Discussed option of trying to store in the refrigerator to improve fishy taste. Patient will try this. Patient is working on eating more fruits and vegetables and stay active.    Subjective: Cheryl Beard is an 64 y.o. year old female who is a primary patient of Cox, Kirsten, MD.  The CCM team was consulted for assistance with disease management and care coordination needs.    Engaged with patient by telephone for follow up visit in response to provider referral for pharmacy case management and/or care coordination services.   Consent to Services:  The patient was given the following information about Chronic Care Management services today, agreed to services, and gave verbal consent: 1. CCM service includes personalized support from designated clinical staff supervised by the primary care provider, including individualized plan of care and coordination with other care providers 2. 24/7 contact phone numbers for assistance for urgent and routine care needs. 3. Service will only be billed when office clinical staff spend 20 minutes or more in a month to coordinate care. 4. Only one practitioner may furnish and bill the service in a calendar month. 5.The patient may stop CCM services at any time (effective at the end of the month) by phone call to the office staff. 6. The patient will be responsible for cost sharing (co-pay) of up to 20% of the service fee (after annual deductible is met). Patient agreed to services and consent obtained.  Patient Care Team: [name], MD as PCP - General (Family Medicine) Burnice Logan, [facility] as Pharmacist (Pharmacist)  Recent office visits:  07/05/2020 - kidney function improved and liver function stable. No changes recommended.   06/04/2020 - Dr. Tobie Poet for left hand pain. Short-term prednisone given. Ordered scan of hand. no nsaids and hydration recommended due to kidney function. Increase allopurinol 300 mg daily. Liver abnormal but improved. Negative hepatitis. Refer to rheumatology.  05/18/2019 - Cox - repeat CMP in 2 weeks due to liver function abnormal/worsened. Kidney function indicates mild renal insufficiency. Thyroid normal. B12, folate and MMA normal. Cholesterol shows increase triglycerides increase Fish oil 2 capsules twice daily. A1c 5.6%.   Recent consult visits:  07/25/2020 [facility] Rheumatology - left hand pain. Follow-up as needed. Refer back to Dr. Tobie Poet for gout management.   Hospital visits: None in previous 6 months   Objective:  Lab Results  Component Value Date   CREATININE 1.04 (H) 07/05/2020   BUN 23 07/05/2020   GFRNONAA 55 (L) 02/15/2020   GFRAA 64 02/15/2020   NA 139 07/05/2020   K 4.4 07/05/2020   CALCIUM 9.4 07/05/2020   CO2 25 07/05/2020   GLUCOSE 116 (H) 07/05/2020    Lab Results  Component Value Date/Time   HGBA1C 5.6 05/17/2020 09:37 AM   HGBA1C 6.1 (H) 02/15/2020 10:22 AM    Last diabetic Eye exam: No results found for: HMDIABEYEEXA  Last diabetic Foot exam: No results found for: HMDIABFOOTEX   Lab Results  Component Value Date   CHOL 132 05/17/2020   HDL 38 (L) 05/17/2020   LDLCALC 60 05/17/2020   TRIG 206 (H) 05/17/2020   CHOLHDL 3.5 05/17/2020    Hepatic Function Latest Ref Rng & Units 07/05/2020 06/04/2020 05/17/2020  Total Protein 6.0 - 8.5 g/dL 6.8 6.8 7.0  Albumin 3.8 -  4.8 g/dL 4.3 4.4 4.3  AST 0 - 40 IU/L 57(H) 46(H) 65(H)  ALT 0 - 32 IU/L 68(H) 72(H) 90(H)  Alk Phosphatase 44 - 121 IU/L 91 107 91  Total Bilirubin 0.0 - 1.2 mg/dL 0.2 0.2 0.3    Lab Results  Component Value Date/Time   TSH 4.400 05/17/2020 09:37 AM   TSH 3.130 05/10/2019 08:50 AM    CBC Latest Ref Rng & Units 06/04/2020 05/17/2020 02/15/2020  WBC 3.4 - 10.8 x10E3/uL 9.7 6.8 8.3   Hemoglobin 11.1 - 15.9 g/dL 22.5 18.9 73.9  Hematocrit 34.0 - 46.6 % 39.9 36.7 40.8  Platelets 150 - 450 x10E3/uL 214 202 218    No results found for: VD25OH  Clinical ASCVD: No  The 10-year ASCVD risk score Denman George DC Jr., et al., 2013) is: 7.3%   Values used to calculate the score:     Age: 83 years     Sex: Female     Is Non-Hispanic African American: No     Diabetic: No     Tobacco smoker: No     Systolic Blood Pressure: 124 mmHg     Is BP treated: Yes     HDL Cholesterol: 38 mg/dL     Total Cholesterol: 132 mg/dL    Depression screen [facility] 2/9 06/04/2020 05/17/2020 02/15/2020  Decreased Interest 0 0 0  Down, Depressed, Hopeless 0 0 0  PHQ - 2 Score 0 0 0     Other: (CHADS2VASc if Afib, MMRC or CAT for COPD, ACT, DEXA)  Social History   Tobacco Use  Smoking Status Former   Pack years: 0.00   Types: Cigarettes   Quit date: 2002   Years since quitting: 20.5  Smokeless Tobacco Never   BP Readings from Last 3 Encounters:  06/04/20 124/72  05/17/20 120/60  02/15/20 130/72   Pulse Readings from Last 3 Encounters:  06/04/20 84  05/17/20 64  02/15/20 84   Wt Readings from Last 3 Encounters:  06/04/20 264 lb (119.7 kg)  05/17/20 267 lb (121.1 kg)  02/15/20 267 lb (121.1 kg)   BMI Readings from Last 3 Encounters:  06/04/20 48.29 kg/m  05/17/20 48.83 kg/m  02/15/20 48.83 kg/m    Assessment/Interventions: Review of patient past medical history, allergies, medications, health status, including review of consultants reports, laboratory and other test data, was performed as part of comprehensive evaluation and provision of chronic care management services.   SDOH:  (Social Determinants of Health) assessments and interventions performed: Yes  SDOH Screenings   Alcohol Screen: Not on file  Depression (PHQ2-9): Low Risk    PHQ-2 Score: 0  Financial Resource Strain: Not on file  Food Insecurity: No Food Insecurity   Worried About Programme researcher, broadcasting/film/video in the Last  Year: Never true   Ran Out of Food in the Last Year: Never true  Housing: Low Risk    Last Housing Risk Score: 0  Physical Activity: Not on file  Social Connections: Not on file  Stress: Not on file  Tobacco Use: Medium Risk   Smoking Tobacco Use: Former   Smokeless Tobacco Use: Never  Transportation Needs: No Transportation Needs   Lack of Transportation (Medical): No   Lack of Transportation (Non-Medical): No    CCM Care Plan  Allergies  Allergen Reactions   Candesartan    Clarithromycin    Colesevelam    Duloxetine    Lovaza [Omega-3-Acid Ethyl Esters] Nausea Only    GI upset   Pregabalin  Medications Reviewed Today     Reviewed by Burnice Logan, [facility] (Pharmacist) on 08/05/20 at 1512  Med List Status: <None>   Medication Order Taking? Sig Documenting Provider Last Dose Status Informant  Albuterol Sulfate (PROAIR RESPICLICK) 381 (90 Base) MCG/ACT AEPB [id number] Yes Inhale 2 puffs into the lungs as needed (for wheezing). Cox, Kirsten, MD Taking Active   allopurinol (ZYLOPRIM) 300 MG tablet [id number] Yes Take 1 tablet (300 mg total) by mouth daily. Cox, Kirsten, MD Taking Active   amitriptyline (ELAVIL) 25 MG tablet [id number] Yes Take one tablet by mouth at bedtime [name], MD Taking Active   aspirin EC 81 MG tablet [id number] Yes Take 81 mg by mouth daily. [provider] Taking Active   calcium carbonate (TUMS - DOSED IN MG ELEMENTAL CALCIUM) 500 MG chewable tablet [id number] Yes Chew 1 tablet by mouth 2 (two) times daily. [provider] Taking Active Self  chlorthalidone (HYGROTON) 50 MG tablet [id number] Yes TAKE ONE TABLET BY MOUTH DAILY Cox, Kirsten, MD Taking Active   cholecalciferol (VITAMIN D3) 25 MCG (1000 UNIT) tablet [id number] Yes Take 1,000 Units by mouth daily. [provider] Taking Active   EDARBI 80 MG TABS [id number] Yes TAKE 1 TABLET BY MOUTH ONCE DAILY Cox, Kirsten, MD Taking Active   metoprolol succinate (TOPROL-XL) 25 MG  24 hr tablet [id number] Yes Take 1 tablet (25 mg total) by mouth daily. Cox, Kirsten, MD Taking Active   Multiple Vitamins-Minerals (WOMENS MULTIVITAMIN PLUS PO) [id number] Yes Take 1 tablet by mouth every morning. [provider] Taking Active Self  Omega-3 Fatty Acids (FISH OIL) 1000 MG CAPS [id number] Yes Take 2 capsules by mouth daily. 2 capsules am and 1 capsule pm [provider] Taking Active Self           Med Note Owens Shark, [place]   Mon Aug 05, 2020  3:12 PM) Patient unable to tolerate 2 capsules am and pm due to belching fish taste at night if taking 2. Discussed trying to store fish oil in refrigerator to reduce belching fishy taste.   pantoprazole (PROTONIX) 40 MG tablet [id number] Yes Take 1 tablet (40 mg total) by mouth daily. Cox, Kirsten, MD Taking Active   rosuvastatin (CRESTOR) 20 MG tablet [id number] Yes Take 1 tablet by mouth daily Lillard Anes, MD Taking Active             Patient Active Problem List   Diagnosis Date Noted   Osteopenia 97/28/2060   Uncomplicated asthma 15/61/5379   Morbid obesity ([place]) 05/14/2019   BMI 45.0-49.9, adult ([place]) 05/14/2019   Hypertensive heart disease without heart failure 05/10/2019   Mixed hyperlipidemia 05/10/2019   Nontoxic multinodular goiter 05/10/2019   Chronic idiopathic gout involving toe of left foot without tophus 05/10/2019   GERD without esophagitis 05/10/2019   Impaired fasting glucose 05/10/2019   Telogen effluvium 05/10/2019   Fibromyalgia 09/11/2014   Coronary artery disease involving native coronary artery of native heart without angina pectoris 08/14/2014   Essential hypertension 08/14/2014   Dyslipidemia 08/14/2014    Immunization History  Administered Date(s) Administered   Fluad Quad(high Dose 65+) 11/14/2019   Moderna SARS-COV2 Booster Vaccination 01/23/2020   Moderna Sars-Covid-2 Vaccination 03/10/2019, 04/05/2019   Pneumococcal Polysaccharide-23 11/14/2019    Conditions to be  addressed/monitored:  Hypertension and Hyperlipidemia  Care Plan : ccm pharmacy care plan  Updates made by Burnice Logan, [place] since 08/06/2020 12:00 AM     Problem: htn, hld   Priority: High  Onset Date: 08/05/2020     Long-Range Goal: Disease State Management   Start Date: 08/05/2020  Expected End Date: 08/06/2021  This Visit's Progress: On track  Priority: High  Note:    Current Barriers:  Unable to independently afford treatment regimen Unable to achieve control of triglycerides   Pharmacist Clinical Goal(s):  Patient will verbalize ability to afford treatment regimen achieve control of triglycerides as evidenced by improved lipid panel result through collaboration with PharmD and provider.   Interventions: 1:1 collaboration with Cox, Kirsten, MD regarding development and update of comprehensive plan of care as evidenced by provider attestation and co-signature Inter-disciplinary care team collaboration (see longitudinal plan of care) Comprehensive medication review performed; medication list updated in electronic medical record  Hypertension (BP goal <140/90) -Controlled -Current treatment: Edarbi 80 mg daily  Chlorthalidone 50 mg daily  Metoprolol succinate 25 mg daily  -Medications previously tried: candesartan   -Current home readings: 115/54, 128/54 (pain in shoulder makes it harderd).  -Current dietary habits: eating more fruits and vegetables.  -Current exercise habits: trying to be more active  -Denies hypotensive/hypertensive symptoms -Educated on BP goals and benefits of medications for prevention of heart attack, stroke and kidney damage; Daily salt intake goal < 2300 mg; Exercise goal of 150 minutes per week; Importance of home blood pressure monitoring; -Counseled to monitor BP at home daily, document, and provide log at future appointments -Counseled on diet and exercise extensively Recommended to continue current medication  Hyperlipidemia: (LDL goal  < 100) -Not ideally controlled -Current treatment: Rosuvastatin 20 mg daily  Fish oil 1000 mg 2 capsules by mouth am and 1 capsule in the evening  -Medications previously tried: Lovaza, Colevesalem -Current dietary patterns: trying to eat better. Eating out less. Increasing fruits and vegetables.  -Current exercise habits: working some outside and trying to stay more active  -Educated on Cholesterol goals;  Benefits of statin for ASCVD risk reduction; Importance of limiting foods high in cholesterol; -Counseled on diet and exercise extensively Counseled on benefits of healthy diet and exercise.  Recommended continuing to limit fried/fatty foods and focus on incorporating more fruits and vegetables. Discussed trying to store fish oil in refrigerator to help with tolerance. Patient unable to take 2 capsules twice daily as prescribed due to increased fishy burps at night.  Patient Goals/Self-Care Activities Patient will:  - take medications as prescribed focus on medication adherence by using pill box check blood pressure daily, document, and provide at future appointments target a minimum of 150 minutes of moderate intensity exercise weekly engage in dietary modifications by limiting fried and fatty foods and increasing fruits and vegetables.   Follow Up Plan: Telephone follow up appointment with care management team member scheduled for: 07/2021 with pharmacist but monthly pharmacy coordination with ccm pharmacy team.      Medication Assistance: None required.  Patient affirms current coverage meets needs.  Compliance/Adherence/Medication fill history: Care Gaps: AWV - 12/13/2019  Star-Rating Drugs: Rosuvastatin 20 mg daily - receives through a program/fill history not available Edarbi 80 mg daily - 07/10/2020 90 day supply   Patient's preferred pharmacy is:  [place], [place] - [address] [id number]  [address] Phone: [phone] Fax: [phone]  [facility] - [place], [place] - [address] [place] [address] Phone: [phone] Fax: [phone]  Uses pill box? Yes Pt endorses 100% compliance  We discussed:  Benefits of medication synchronization, packaging and delivery as well as enhanced pharmacist oversight with [facility]. Patient decided to: Utilize [facility] pharmacy for medication synchronization, packaging and delivery  Care Plan and Follow Up Patient Decision:  Patient agrees to Care Plan and Follow-up.  Plan: Telephone follow up appointment with care management team member scheduled for:  07/2021

## 2020-08-05 ENCOUNTER — Ambulatory Visit (INDEPENDENT_AMBULATORY_CARE_PROVIDER_SITE_OTHER): Payer: PPO

## 2020-08-05 DIAGNOSIS — E782 Mixed hyperlipidemia: Secondary | ICD-10-CM | POA: Diagnosis not present

## 2020-08-05 DIAGNOSIS — I119 Hypertensive heart disease without heart failure: Secondary | ICD-10-CM | POA: Diagnosis not present

## 2020-08-06 NOTE — Patient Instructions (Signed)
Visit Information   Goals Addressed             This Visit's Progress    Lifestyle Change-Hypertension       Timeframe:  Long-Range Goal Priority:  High Start Date:                             Expected End Date:                       Follow Up Date 07/2021    - ask questions to understand    Why is this important?   The changes that you are asked to make may be hard to do.  This is especially true when the changes are life-long.  Knowing why it is important to you is the first step.  Working on the change with your family or support person helps you not feel alone.  Reward yourself and family or support person when goals are met. This can be an activity you choose like bowling, hiking, biking, swimming or shooting hoops.     Notes:       Track and Manage My Blood Pressure-Hypertension       Timeframe:  Long-Range Goal Priority:  High Start Date:                             Expected End Date:                       Follow Up Date 07/2021    - check blood pressure daily    Why is this important?   You won't feel high blood pressure, but it can still hurt your blood vessels.  High blood pressure can cause heart or kidney problems. It can also cause a stroke.  Making lifestyle changes like losing a little weight or eating less salt will help.  Checking your blood pressure at home and at different times of the day can help to control blood pressure.  If the doctor prescribes medicine remember to take it the way the doctor ordered.  Call the office if you cannot afford the medicine or if there are questions about it.     Notes:         Patient Care Plan: ccm pharmacy care plan     Problem Identified: htn, hld   Priority: High  Onset Date: 08/05/2020     Long-Range Goal: Disease State Management   Start Date: 08/05/2020  Expected End Date: 08/06/2021  This Visit's Progress: On track  Priority: High  Note:    Current Barriers:  Unable to independently afford  treatment regimen Unable to achieve control of triglycerides   Pharmacist Clinical Goal(s):  Patient will verbalize ability to afford treatment regimen achieve control of triglycerides as evidenced by improved lipid panel result through collaboration with PharmD and provider.   Interventions: 1:1 collaboration with Cox, Kirsten, MD regarding development and update of comprehensive plan of care as evidenced by provider attestation and co-signature Inter-disciplinary care team collaboration (see longitudinal plan of care) Comprehensive medication review performed; medication list updated in electronic medical record  Hypertension (BP goal <140/90) -Controlled -Current treatment: Edarbi 80 mg daily  Chlorthalidone 50 mg daily  Metoprolol succinate 25 mg daily  -Medications previously tried: candesartan   -Current home readings: 115/54, 128/54 (pain in shoulder makes it harderd).  -Current  dietary habits: eating more fruits and vegetables.  -Current exercise habits: trying to be more active  -Denies hypotensive/hypertensive symptoms -Educated on BP goals and benefits of medications for prevention of heart attack, stroke and kidney damage; Daily salt intake goal < 2300 mg; Exercise goal of 150 minutes per week; Importance of home blood pressure monitoring; -Counseled to monitor BP at home daily, document, and provide log at future appointments -Counseled on diet and exercise extensively Recommended to continue current medication  Hyperlipidemia: (LDL goal < 100) -Not ideally controlled -Current treatment: Rosuvastatin 20 mg daily  Fish oil 1000 mg 2 capsules by mouth am and 1 capsule in the evening  -Medications previously tried: Lovaza, Colevesalem -Current dietary patterns: trying to eat better. Eating out less. Increasing fruits and vegetables.  -Current exercise habits: working some outside and trying to stay more active  -Educated on Cholesterol goals;  Benefits of statin for  ASCVD risk reduction; Importance of limiting foods high in cholesterol; -Counseled on diet and exercise extensively Counseled on benefits of healthy diet and exercise.  Recommended continuing to limit fried/fatty foods and focus on incorporating more fruits and vegetables. Discussed trying to store fish oil in refrigerator to help with tolerance. Patient unable to take 2 capsules twice daily as prescribed due to increased fishy burps at night.  Patient Goals/Self-Care Activities Patient will:  - take medications as prescribed focus on medication adherence by using pill box check blood pressure daily, document, and provide at future appointments target a minimum of 150 minutes of moderate intensity exercise weekly engage in dietary modifications by limiting fried and fatty foods and increasing fruits and vegetables.   Follow Up Plan: Telephone follow up appointment with care management team member scheduled for: 07/2021 with pharmacist but monthly pharmacy coordination with ccm pharmacy team.      Patient verbalizes understanding of instructions provided today and agrees to view in [place].  Telephone follow up appointment with pharmacy team member scheduled for: 07/2021 with pharmacist unless needed sooner. Pharmacy team will coordinate medication delivery needs each month.   Burnice Logan, [facility]

## 2020-09-20 ENCOUNTER — Ambulatory Visit (INDEPENDENT_AMBULATORY_CARE_PROVIDER_SITE_OTHER): Payer: PPO | Admitting: Family Medicine

## 2020-09-20 ENCOUNTER — Encounter: Payer: Self-pay | Admitting: Family Medicine

## 2020-09-20 ENCOUNTER — Other Ambulatory Visit: Payer: Self-pay

## 2020-09-20 VITALS — BP 110/64 | HR 88 | Temp 97.1°F | Resp 18 | Ht 62.0 in | Wt 267.0 lb

## 2020-09-20 DIAGNOSIS — I119 Hypertensive heart disease without heart failure: Secondary | ICD-10-CM | POA: Diagnosis not present

## 2020-09-20 DIAGNOSIS — Z23 Encounter for immunization: Secondary | ICD-10-CM | POA: Diagnosis not present

## 2020-09-20 DIAGNOSIS — R7303 Prediabetes: Secondary | ICD-10-CM

## 2020-09-20 DIAGNOSIS — I2510 Atherosclerotic heart disease of native coronary artery without angina pectoris: Secondary | ICD-10-CM

## 2020-09-20 DIAGNOSIS — E782 Mixed hyperlipidemia: Secondary | ICD-10-CM | POA: Diagnosis not present

## 2020-09-20 DIAGNOSIS — M797 Fibromyalgia: Secondary | ICD-10-CM

## 2020-09-20 NOTE — Progress Notes (Signed)
Subjective:  Patient ID: Cheryl Beard, female    DOB: 18-Sep-1955  Age: 65 y.o. MRN: [id number]  Chief Complaint  Patient presents with   Hypertension   Hyperlipidemia   HPI: Hyperlipidemia: Current medications: Omega 3 1000 mg, Crestor 20 mg Lifestyle changes: trying to eat healthy. Walking some.   Hypertension: Complications: CAD Current medications: Edarbi 80 mg, Chlorthalidone 50 mg Ranges: 113/56 to 118/56  Lifestyle changes: trying to eat healthy. Walking.   CAD:  Aspirin 81 mg daily, Metoprolol 25 mg   Prediabetes:  Last eye exam in May 2022.   GERD: Protonix 40 mg daily. Gerd controlled.  Gout: Allopurinol 300 mg daily   Has had 3 Moderna COVID-19 vaccines, declines booster Moderna COVID-19 vaccine due to side effects.     Hypertension Pertinent negatives include no chest pain, headaches or shortness of breath.  Hyperlipidemia Associated symptoms include myalgias. Pertinent negatives include no chest pain or shortness of breath.    Current Outpatient Medications on File Prior to Visit  Medication Sig Dispense Refill   Albuterol Sulfate (PROAIR RESPICLICK) 123XX123 (90 Base) MCG/ACT AEPB Inhale 2 puffs into the lungs as needed (for wheezing). 1 each 2   allopurinol (ZYLOPRIM) 300 MG tablet Take 1 tablet (300 mg total) by mouth daily. 90 tablet 0   amitriptyline (ELAVIL) 25 MG tablet Take one tablet by mouth at bedtime 90 tablet 1   aspirin EC 81 MG tablet Take 81 mg by mouth daily.     calcium carbonate (TUMS - DOSED IN MG ELEMENTAL CALCIUM) 500 MG chewable tablet Chew 1 tablet by mouth 2 (two) times daily.     chlorthalidone (HYGROTON) 50 MG tablet TAKE ONE TABLET BY MOUTH DAILY 90 tablet 1   cholecalciferol (VITAMIN D3) 25 MCG (1000 UNIT) tablet Take 1,000 Units by mouth daily.     EDARBI 80 MG TABS TAKE 1 TABLET BY MOUTH ONCE DAILY 90 tablet 1   metoprolol succinate (TOPROL-XL) 25 MG 24 hr tablet Take 1 tablet (25 mg total) by mouth daily. 90 tablet 3    Multiple Vitamins-Minerals (WOMENS MULTIVITAMIN PLUS PO) Take 1 tablet by mouth every morning.     Omega-3 Fatty Acids (FISH OIL) 1000 MG CAPS Take 2 capsules by mouth daily. 2 capsules am and 1 capsule pm     pantoprazole (PROTONIX) 40 MG tablet Take 1 tablet (40 mg total) by mouth daily. 90 tablet 3   rosuvastatin (CRESTOR) 20 MG tablet Take 1 tablet by mouth daily 90 tablet 1   No current facility-administered medications on file prior to visit.   Past Medical History:  Diagnosis Date   Allergy    CAD (coronary artery disease)    Depression    Erosive esophagitis    Erosive gastritis    Gallbladder disease    GERD (gastroesophageal reflux disease)    History of Clostridium difficile infection    History of COVID-19    History of myocardial infarction    Hyperlipidemia    Hypertension    Hypertensive urgency    Idiopathic gout    Myocardial infarction ([place])    Nontoxic multinodular goiter    Other asthma    Vitamin D deficiency    Past Surgical History:  Procedure Laterality Date   ABDOMINAL HYSTERECTOMY     APPENDECTOMY     CHOLECYSTECTOMY     ROTATOR CUFF REPAIR      Family History  Problem Relation Age of Onset   Cancer Mother    Parkinson's  disease Mother    Diabetes Mother    Hypertension Mother    Stroke Father    Hyperlipidemia Sister    Hypertension Sister    Alzheimer's disease Maternal Grandmother    Fibromyalgia Sister    Cancer Sister    Cancer Paternal Grandmother        Leukemia   Social History   Socioeconomic History   Marital status: Married    Spouse name: Not on file   Number of children: 3   Years of education: Not on file   Highest education level: Not on file  Occupational History   Not on file  Tobacco Use   Smoking status: Former    Types: Cigarettes    Quit date: 2002    Years since quitting: 20.6   Smokeless tobacco: Never  Substance and Sexual Activity   Alcohol use: Never   Drug use: Never   Sexual activity: Not on  file  Other Topics Concern   Not on file  Social History Narrative   Not on file   Social Determinants of Health   Financial Resource Strain: Not on file  Food Insecurity: No Food Insecurity   Worried About Charity fundraiser in the Last Year: Never true   [place] in the Last Year: Never true  Transportation Needs: No Transportation Needs   Lack of Transportation (Medical): No   Lack of Transportation (Non-Medical): No  Physical Activity: Not on file  Stress: Not on file  Social Connections: Not on file    Review of Systems  Constitutional:  Negative for chills, fatigue and fever.  HENT:  Positive for congestion. Negative for ear pain and sore throat.   Eyes:  Positive for visual disturbance (bilateral cataracts).  Respiratory:  Negative for cough and shortness of breath.   Cardiovascular:  Negative for chest pain.  Gastrointestinal:  Negative for abdominal pain, constipation, diarrhea, nausea and vomiting.  Endocrine: Negative for polydipsia, polyphagia and polyuria.  Genitourinary:  Negative for dysuria and frequency.  Musculoskeletal:  Positive for arthralgias (hands & hips) and myalgias.  Skin:  Negative for rash.  Neurological:  Negative for headaches.  Psychiatric/Behavioral:  Negative for dysphoric mood. The patient is not nervous/anxious.     Objective:  BP 110/64   Pulse 88   Temp (!) 97.1 F (36.2 C)   Resp 18   Ht '5\' 2"'$  (1.575 m)   Wt 267 lb (121.1 kg)   BMI 48.83 kg/m   BP/Weight 09/20/2020 06/04/2020 0000000  Systolic BP A999333 [id number] [id number]  Diastolic BP 64 72 60  Wt. (Lbs) 267 264 267  BMI 48.83 48.29 48.83    Physical Exam Vitals reviewed.  Constitutional:      Appearance: Normal appearance. She is obese.  Neck:     Vascular: No carotid bruit.  Cardiovascular:     Rate and Rhythm: Normal rate and regular rhythm.     Pulses: Normal pulses.     Heart sounds: Normal heart sounds.  Pulmonary:     Effort: Pulmonary effort is normal.      Breath sounds: Normal breath sounds.  Abdominal:     General: Bowel sounds are normal.     Tenderness: There is no abdominal tenderness.  Musculoskeletal:     Comments: FM trigger pts mild  Neurological:     Mental Status: She is alert and oriented to person, place, and time.  Psychiatric:        Mood and Affect: Mood normal.  Behavior: Behavior normal.    Diabetic Foot Exam - Simple   No data filed      Lab Results  Component Value Date   WBC 9.7 06/04/2020   HGB 13.4 06/04/2020   HCT 39.9 06/04/2020   PLT 214 06/04/2020   GLUCOSE 116 (H) 07/05/2020   CHOL 132 05/17/2020   TRIG 206 (H) 05/17/2020   HDL 38 (L) 05/17/2020   LDLCALC 60 05/17/2020   ALT 68 (H) 07/05/2020   AST 57 (H) 07/05/2020   NA 139 07/05/2020   K 4.4 07/05/2020   CL 98 07/05/2020   CREATININE 1.04 (H) 07/05/2020   BUN 23 07/05/2020   CO2 25 07/05/2020   TSH 4.400 05/17/2020   HGBA1C 5.6 05/17/2020    I,Pavan Bring,acting as a scribe for [name], MD.,have documented all relevant documentation on the behalf of [name], MD,as directed by  [name], MD while in the presence of [name], MD.    [place]:   1. Hypertensive heart disease without heart failure Well controlled.  No changes to medicines.  Continue to work on eating a healthy diet and exercise.  Labs drawn today.  - CBC with Differential - Comprehensive metabolic panel  2. Coronary artery disease involving native coronary artery of native heart without angina pectoris The current medical regimen is effective;  continue present plan and medications. Management per specialist.   3. Mixed hyperlipidemia Well controlled.  No changes to medicines.  Continue to work on eating a healthy diet and exercise.  Labs drawn today.  - Lipid panel  4. Influenza vaccine administered - Flu Vaccine QUAD High Dose(Fluad)  5. Fibromyalgia The current medical regimen is effective;  continue present plan and  medications.  6. Prediabetes  Recommend continue to work on eating healthy diet and exercise.  No orders of the defined types were placed in this encounter.   Orders Placed This Encounter  Procedures   Flu Vaccine QUAD High Dose(Fluad)   CBC with Differential   Comprehensive metabolic panel   Lipid panel      Follow-up: Return in about 4 months (around 01/20/2021).  An After Visit Summary was printed and given to the patient.  [name], MD [facility] [phone]

## 2020-09-21 LAB — COMPREHENSIVE METABOLIC PANEL
ALT: 59 IU/L — ABNORMAL HIGH (ref 0–32)
AST: 56 IU/L — ABNORMAL HIGH (ref 0–40)
Albumin/Globulin Ratio: 1.7 (ref 1.2–2.2)
Albumin: 4.3 g/dL (ref 3.8–4.8)
Alkaline Phosphatase: 100 IU/L (ref 44–121)
BUN/Creatinine Ratio: 18 (ref 12–28)
BUN: 23 mg/dL (ref 8–27)
Bilirubin Total: 0.2 mg/dL (ref 0.0–1.2)
CO2: 24 mmol/L (ref 20–29)
Calcium: 9.7 mg/dL (ref 8.7–10.3)
Chloride: 98 mmol/L (ref 96–106)
Creatinine, Ser: 1.27 mg/dL — ABNORMAL HIGH (ref 0.57–1.00)
Globulin, Total: 2.5 g/dL (ref 1.5–4.5)
Glucose: 105 mg/dL — ABNORMAL HIGH (ref 65–99)
Potassium: 4.3 mmol/L (ref 3.5–5.2)
Sodium: 137 mmol/L (ref 134–144)
Total Protein: 6.8 g/dL (ref 6.0–8.5)
eGFR: 47 mL/min/{1.73_m2} — ABNORMAL LOW (ref >59)

## 2020-09-21 LAB — CBC WITH DIFFERENTIAL/PLATELET
Basophils Absolute: 0 10*3/uL (ref 0.0–0.2)
Basos: 0 %
EOS (ABSOLUTE): 0.1 10*3/uL (ref 0.0–0.4)
Eos: 1 %
Hematocrit: 38.4 % (ref 34.0–46.6)
Hemoglobin: 12.8 g/dL (ref 11.1–15.9)
Immature Grans (Abs): 0 10*3/uL (ref 0.0–0.1)
Immature Granulocytes: 0 %
Lymphocytes Absolute: 2.7 10*3/uL (ref 0.7–3.1)
Lymphs: 34 %
MCH: 31.9 pg (ref 26.6–33.0)
MCHC: 33.3 g/dL (ref 31.5–35.7)
MCV: 96 fL (ref 79–97)
Monocytes Absolute: 0.8 10*3/uL (ref 0.1–0.9)
Monocytes: 10 %
Neutrophils Absolute: 4.3 10*3/uL (ref 1.4–7.0)
Neutrophils: 55 %
Platelets: 194 10*3/uL (ref 150–450)
RBC: 4.01 x10E6/uL (ref 3.77–5.28)
RDW: 13.8 % (ref 11.7–15.4)
WBC: 8 10*3/uL (ref 3.4–10.8)

## 2020-09-21 LAB — LIPID PANEL
Chol/HDL Ratio: 3.3 ratio (ref 0.0–4.4)
Cholesterol, Total: 110 mg/dL (ref 100–199)
HDL: 33 mg/dL — ABNORMAL LOW (ref >39)
LDL Chol Calc (NIH): 50 mg/dL (ref 0–99)
Triglycerides: 156 mg/dL — ABNORMAL HIGH (ref 0–149)
VLDL Cholesterol Cal: 27 mg/dL (ref 5–40)

## 2020-09-21 LAB — CARDIOVASCULAR RISK ASSESSMENT

## 2020-09-28 ENCOUNTER — Other Ambulatory Visit: Payer: Self-pay | Admitting: Family Medicine

## 2020-09-28 DIAGNOSIS — M797 Fibromyalgia: Secondary | ICD-10-CM

## 2020-10-03 ENCOUNTER — Other Ambulatory Visit: Payer: Self-pay | Admitting: Family Medicine

## 2020-10-04 ENCOUNTER — Telehealth: Payer: Self-pay

## 2020-10-04 NOTE — Chronic Care Management (AMB) (Signed)
    Chronic Care Management Pharmacy Assistant   Name: Cheryl Beard  MRN: [id number] DOB: 1955/02/15  Reason for Encounter: Medication Coordination  Recent office visits:  09/20/20- Cheryl Brome, MD-seen for chronic conditions, labs ordered, no medication changes, follow up 4 months   Recent consult visits:  No visits noted  Hospital visits:  None in previous 6 months  Medications: Outpatient Encounter Medications as of 10/04/2020  Medication Sig Note   Albuterol Sulfate (PROAIR RESPICLICK) 123XX123 (90 Base) MCG/ACT AEPB Inhale 2 puffs into the lungs as needed (for wheezing).    allopurinol (ZYLOPRIM) 300 MG tablet Take 1 tablet (300 mg total) by mouth daily.    amitriptyline (ELAVIL) 25 MG tablet TAKE ONE TABLET BY MOUTH EVERYDAY AT BEDTIME    aspirin EC 81 MG tablet Take 81 mg by mouth daily.    calcium carbonate (TUMS - DOSED IN MG ELEMENTAL CALCIUM) 500 MG chewable tablet Chew 1 tablet by mouth 2 (two) times daily.    chlorthalidone (HYGROTON) 50 MG tablet TAKE ONE TABLET BY MOUTH DAILY    cholecalciferol (VITAMIN D3) 25 MCG (1000 UNIT) tablet Take 1,000 Units by mouth daily.    EDARBI 80 MG TABS TAKE 1 TABLET BY MOUTH ONCE DAILY    metoprolol succinate (TOPROL-XL) 25 MG 24 hr tablet Take 1 tablet (25 mg total) by mouth daily.    Multiple Vitamins-Minerals (WOMENS MULTIVITAMIN PLUS PO) Take 1 tablet by mouth every morning.    Omega-3 Fatty Acids (FISH OIL) 1000 MG CAPS Take 2 capsules by mouth daily. 2 capsules am and 1 capsule pm 08/05/2020: Patient unable to tolerate 2 capsules am and pm due to belching fish taste at night if taking 2. Discussed trying to store fish oil in refrigerator to reduce belching fishy taste.    pantoprazole (PROTONIX) 40 MG tablet Take 1 tablet (40 mg total) by mouth daily.    rosuvastatin (CRESTOR) 20 MG tablet Take 1 tablet by mouth daily    No facility-administered encounter medications on file as of 10/04/2020.   Reviewed chart for medication changes  ahead of medication coordination call.  No OVs, Consults, or hospital visits since last care coordination call/Pharmacist visit. (If appropriate, list visit date, provider name)  No medication changes indicated OR if recent visit, treatment plan here.  BP Readings from Last 3 Encounters:  09/20/20 110/64  06/04/20 124/72  05/17/20 120/60    Lab Results  Component Value Date   HGBA1C 5.6 05/17/2020     Patient obtains medications through Adherence Packaging  90 Days   Last adherence delivery included: Allopurinol '300mg'$ -breakfast Amitriptyline '25mg'$ -bedtime Chlorthalidone '50mg'$ -breakfast Metoprolol ER Succinate '50mg'$ -1/2 at breakfast Rosuvastatin '20mg'$ -bedtime    Pantoprazole '40mg'$ -supper   Patient is due for next adherence delivery on: 10/14/20. Called patient and reviewed medications and coordinated delivery.  This delivery to include: Allopurinol '300mg'$ -breakfast Amitriptyline '25mg'$ -bedtime Chlorthalidone '50mg'$ -breakfast Metoprolol ER Succinate '50mg'$ -1/2 at breakfast Rosuvastatin '20mg'$ -bedtime    Pantoprazole '40mg'$ -supper   Patient needs refills for Allopurinol '300mg'$ -.  Confirmed delivery date of 10/14/20, advised patient that pharmacy will contact them the morning of delivery.  Cheryl Beard CPA, CMA

## 2020-10-09 ENCOUNTER — Other Ambulatory Visit: Payer: Self-pay | Admitting: Family Medicine

## 2020-12-03 ENCOUNTER — Other Ambulatory Visit: Payer: Self-pay | Admitting: Family Medicine

## 2020-12-03 ENCOUNTER — Telehealth: Payer: Self-pay | Admitting: Family Medicine

## 2020-12-03 ENCOUNTER — Other Ambulatory Visit: Payer: Self-pay

## 2020-12-03 DIAGNOSIS — Z1231 Encounter for screening mammogram for malignant neoplasm of breast: Secondary | ICD-10-CM

## 2020-12-03 NOTE — Telephone Encounter (Signed)
Pt called wanting to schedule mammogram in December on the bus can you please but in the order

## 2020-12-23 ENCOUNTER — Ambulatory Visit
Admission: RE | Admit: 2020-12-23 | Discharge: 2020-12-23 | Disposition: A | Discharge status: Home / Self Care | Payer: PPO | Source: Ambulatory Visit | Attending: Family Medicine | Admitting: Family Medicine

## 2020-12-23 ENCOUNTER — Other Ambulatory Visit: Payer: Self-pay

## 2020-12-23 DIAGNOSIS — Z1231 Encounter for screening mammogram for malignant neoplasm of breast: Secondary | ICD-10-CM

## 2020-12-28 ENCOUNTER — Other Ambulatory Visit: Payer: Self-pay | Admitting: Family Medicine

## 2020-12-28 ENCOUNTER — Other Ambulatory Visit: Payer: Self-pay | Admitting: Legal Medicine

## 2020-12-31 ENCOUNTER — Telehealth: Payer: Self-pay

## 2020-12-31 NOTE — Progress Notes (Signed)
    Chronic Care Management Pharmacy Assistant   Name: Cheryl Beard  MRN: [id number] DOB: April 15, 1955   Reason for Encounter: Medication Coordination for [facility]    Recent office visits:  None   Recent consult visits:  None  Hospital visits:  None   Medications: Outpatient Encounter Medications as of 12/31/2020  Medication Sig Note   Albuterol Sulfate (PROAIR RESPICLICK) 785 (90 Base) MCG/ACT AEPB Inhale 2 puffs into the lungs as needed (for wheezing).    allopurinol (ZYLOPRIM) 300 MG tablet TAKE ONE TABLET BY MOUTH DAILY    amitriptyline (ELAVIL) 25 MG tablet TAKE ONE TABLET BY MOUTH EVERYDAY AT BEDTIME    aspirin EC 81 MG tablet Take 81 mg by mouth daily.    calcium carbonate (TUMS - DOSED IN MG ELEMENTAL CALCIUM) 500 MG chewable tablet Chew 1 tablet by mouth 2 (two) times daily.    chlorthalidone (HYGROTON) 50 MG tablet TAKE ONE TABLET BY MOUTH DAILY    cholecalciferol (VITAMIN D3) 25 MCG (1000 UNIT) tablet Take 1,000 Units by mouth daily.    EDARBI 80 MG TABS TAKE 1 TABLET BY MOUTH ONCE DAILY    metoprolol succinate (TOPROL-XL) 25 MG 24 hr tablet TAKE ONE TABLET BY MOUTH ONCE DAILY    Multiple Vitamins-Minerals (WOMENS MULTIVITAMIN PLUS PO) Take 1 tablet by mouth every morning.    Omega-3 Fatty Acids (FISH OIL) 1000 MG CAPS Take 2 capsules by mouth daily. 2 capsules am and 1 capsule pm 08/05/2020: Patient unable to tolerate 2 capsules am and pm due to belching fish taste at night if taking 2. Discussed trying to store fish oil in refrigerator to reduce belching fishy taste.    pantoprazole (PROTONIX) 40 MG tablet Take 1 tablet (40 mg total) by mouth daily.    rosuvastatin (CRESTOR) 20 MG tablet TAKE ONE TABLET BY MOUTH EVERYDAY AT BEDTIME    No facility-administered encounter medications on file as of 12/31/2020.   Reviewed chart for medication changes ahead of medication coordination call.  No OVs, Consults, or hospital visits since last care coordination call/Pharmacist  visit. (If appropriate, list visit date, provider name)  No medication changes indicated OR if recent visit, treatment plan here.  BP Readings from Last 3 Encounters:  09/20/20 110/64  06/04/20 124/72  05/17/20 120/60    Lab Results  Component Value Date   HGBA1C 5.6 05/17/2020     Patient obtains medications through Adherence Packaging  90 Days   Last adherence delivery included:  Allopurinol 300mg -breakfast Amitriptyline 25mg -bedtime Chlorthalidone 50mg -breakfast Metoprolol ER Succinate 50mg -1/2 at breakfast Rosuvastatin 20mg -bedtime    Pantoprazole 40mg -supper   Patient is due for next adherence delivery on: 01/09/21. Called patient and reviewed medications and coordinated delivery.  This delivery to include: Allopurinol 300mg -breakfast Amitriptyline 25mg -bedtime Chlorthalidone 50mg -breakfast Metoprolol ER Succinate 25mg -1 at breakfast Rosuvastatin 20mg -bedtime    Pantoprazole 40mg -1 evening meal   Patient declined the following medications  Albuterol Sulfate 108- Does not need, only uses PRN  Edarbi 80 mg- Gets at another pharmacy cheaper  Patient needs refills for- Sent refill request to provider Allopurinol 300mg  Chlorthalidone 50mg  Metoprolol ER Succinate 25mg  Rosuvastatin 20mg   Confirmed delivery date of 01/09/21, advised patient that pharmacy will contact them the morning of delivery.  Elray Mcgregor, [place] Pharmacist Assistant  [phone]

## 2021-01-01 ENCOUNTER — Other Ambulatory Visit: Payer: Self-pay

## 2021-01-01 MED ORDER — CHLORTHALIDONE 50 MG PO TABS: 50.0000 mg | ORAL_TABLET | Freq: Every day | ORAL | 1 refills | Status: DC

## 2021-01-01 MED ORDER — METOPROLOL SUCCINATE ER 25 MG PO TB24: 25.0000 mg | ORAL_TABLET | Freq: Every day | ORAL | 1 refills | Status: DC

## 2021-01-01 MED ORDER — ALLOPURINOL 300 MG PO TABS: 300.0000 mg | ORAL_TABLET | Freq: Every day | ORAL | 0 refills | Status: DC

## 2021-01-01 MED ORDER — ROSUVASTATIN CALCIUM 20 MG PO TABS: ORAL_TABLET | ORAL | 1 refills | Status: DC

## 2021-01-01 NOTE — Telephone Encounter (Signed)
Compliant on meds, f/u 1 month

## 2021-01-03 ENCOUNTER — Other Ambulatory Visit: Payer: Self-pay | Admitting: Family Medicine

## 2021-01-03 MED ORDER — ROSUVASTATIN CALCIUM 20 MG PO TABS
ORAL_TABLET | ORAL | 1 refills | Status: DC
Start: 2021-01-03 — End: 2022-03-27

## 2021-01-03 MED ORDER — METOPROLOL SUCCINATE ER 25 MG PO TB24: 25.0000 mg | ORAL_TABLET | Freq: Every day | ORAL | 1 refills | Status: DC

## 2021-01-03 MED ORDER — CHLORTHALIDONE 50 MG PO TABS: 50.0000 mg | ORAL_TABLET | Freq: Every day | ORAL | 1 refills | Status: DC

## 2021-01-03 MED ORDER — ALLOPURINOL 300 MG PO TABS: 300.0000 mg | ORAL_TABLET | Freq: Every day | ORAL | 3 refills | Status: DC

## 2021-01-03 NOTE — Progress Notes (Unsigned)
Rxs [facility] to [facility].  Allopurinol 300mg   Chlorthalidone 50mg   Metoprolol ER Succinate 25mg   Rosuvastatin 20mg   Dr. Tobie Poet

## 2021-01-21 NOTE — Progress Notes (Signed)
Subjective:  Patient ID: Cheryl Beard, female    DOB: [birth year]  Age: 65 y.o. MRN: [id number]  Chief Complaint  Patient presents with   Hyperlipidemia   Prediabetes   HPI: Patient is a 65 year old white female with past medical history of hypertensive heart disease, hyperlipidemia, gout, GERD, prediabetes, and fibromyalgia who presents for chronic follow-up.  Hyperlipidemia is currently being treated with OTC fish oil 2 capsules daily and rosuvastatin 20 mg once daily.  Hypertension is being controlled with Edarbi 80 mg once daily and metoprolol 25 mg once daily and chlorthalidone 50 mg once daily.  Patient has history of gout and is currently on allopurinol 300 mg once daily.  For her heart disease she is also on aspirin 81 mg once daily as well as the above blood pressure and cholesterol medications.  Patient has fibromyalgia and is currently on amitriptyline 25 mg once daily.  Patient is doing well other than complaining of lumbar back pain and some left hip pain which she relates to having stood so much during the Christmas season to cook.  She has been resting this.  In her last labs she had elevated liver function tests as well as some chronic renal insufficiency so she has been unable to take any Tylenol or NSAIDs.  She has been using heat which has helped some. Report eating healthy but not exercising due to her pain.  She has not had x-rays previously.   Current Outpatient Medications on File Prior to Visit  Medication Sig Dispense Refill   Albuterol Sulfate (PROAIR RESPICLICK) 867 (90 Base) MCG/ACT AEPB Inhale 2 puffs into the lungs as needed (for wheezing). 1 each 2   allopurinol (ZYLOPRIM) 300 MG tablet Take 1 tablet (300 mg total) by mouth daily. 90 tablet 3   amitriptyline (ELAVIL) 25 MG tablet TAKE ONE TABLET BY MOUTH EVERYDAY AT BEDTIME 90 tablet 1   aspirin EC 81 MG tablet Take 81 mg by mouth daily.     calcium carbonate (TUMS - DOSED IN MG ELEMENTAL CALCIUM) 500 MG  chewable tablet Chew 1 tablet by mouth 2 (two) times daily.     chlorthalidone (HYGROTON) 50 MG tablet Take 1 tablet (50 mg total) by mouth daily. 90 tablet 1   cholecalciferol (VITAMIN D3) 25 MCG (1000 UNIT) tablet Take 1,000 Units by mouth daily.     EDARBI 80 MG TABS TAKE 1 TABLET BY MOUTH ONCE DAILY 90 tablet 3   metoprolol succinate (TOPROL-XL) 25 MG 24 hr tablet Take 1 tablet (25 mg total) by mouth daily. 90 tablet 1   Multiple Vitamins-Minerals (WOMENS MULTIVITAMIN PLUS PO) Take 1 tablet by mouth every morning.     Omega-3 Fatty Acids (FISH OIL) 1000 MG CAPS Take 2 capsules by mouth daily. 2 capsules am and 1 capsule pm     pantoprazole (PROTONIX) 40 MG tablet Take 1 tablet (40 mg total) by mouth daily. 90 tablet 3   rosuvastatin (CRESTOR) 20 MG tablet TAKE ONE TABLET BY MOUTH EVERYDAY AT BEDTIME 90 tablet 1   No current facility-administered medications on file prior to visit.   Past Medical History:  Diagnosis Date   Allergy    CAD (coronary artery disease)    Depression    Erosive esophagitis    Erosive gastritis    Gallbladder disease    GERD (gastroesophageal reflux disease)    History of Clostridium difficile infection    History of COVID-19    History of myocardial infarction    Hyperlipidemia  Hypertension    Hypertensive urgency    Idiopathic gout    Myocardial infarction (HCC)    Nontoxic multinodular goiter    Other asthma    Vitamin D deficiency    Past Surgical History:  Procedure Laterality Date   ABDOMINAL HYSTERECTOMY     APPENDECTOMY     CHOLECYSTECTOMY     ROTATOR CUFF REPAIR      Family History  Problem Relation Age of Onset   Cancer Mother    Parkinson's disease Mother    Diabetes Mother    Hypertension Mother    Stroke Father    Hyperlipidemia Sister    Hypertension Sister    Alzheimer's disease Maternal Grandmother    Fibromyalgia Sister    Cancer Sister    Cancer Paternal Grandmother        Leukemia   Social History    Socioeconomic History   Marital status: Married    Spouse name: Not on file   Number of children: 3   Years of education: Not on file   Highest education level: Not on file  Occupational History   Not on file  Tobacco Use   Smoking status: Former    Types: Cigarettes    Quit date: 2002    Years since quitting: 21.0   Smokeless tobacco: Never  Substance and Sexual Activity   Alcohol use: Never   Drug use: Never   Sexual activity: Not on file  Other Topics Concern   Not on file  Social History Narrative   Not on file   Social Determinants of Health   Financial Resource Strain: Not on file  Food Insecurity: No Food Insecurity   Worried About Running Out of Food in the Last Year: Never true   [place] in the Last Year: Never true  Transportation Needs: No Transportation Needs   Lack of Transportation (Medical): No   Lack of Transportation (Non-Medical): No  Physical Activity: Not on file  Stress: Not on file  Social Connections: Not on file    Review of Systems  Constitutional:  Negative for appetite change, fatigue and fever.  HENT:  Negative for congestion, ear pain, sinus pressure and sore throat.   Eyes:  Negative for pain.  Respiratory:  Negative for cough, chest tightness, shortness of breath and wheezing.   Cardiovascular:  Negative for chest pain and palpitations.  Gastrointestinal:  Negative for abdominal pain, constipation, diarrhea, nausea and vomiting.  Genitourinary:  Negative for dysuria and hematuria.  Musculoskeletal:  Negative for arthralgias, back pain, joint swelling and myalgias.  Skin:  Negative for rash.  Neurological:  Negative for dizziness, weakness and headaches.  Psychiatric/Behavioral:  Negative for dysphoric mood. The patient is not nervous/anxious.     Objective:  BP 130/68 (BP Location: Right Arm, Patient Position: Sitting)    Pulse 86    Temp 98.1 F (36.7 C) (Temporal)    Ht 5\' 2"  (1.575 m)    Wt 264 lb (119.7 kg)    SpO2  98%    BMI 48.29 kg/m   BP/Weight 01/23/2021 09/20/2020 0/27/2536  Systolic BP 644 034 742  Diastolic BP 68 64 72  Wt. (Lbs) 264 267 264  BMI 48.29 48.83 48.29    Physical Exam Vitals reviewed.  Constitutional:      Appearance: Normal appearance. She is obese.  Neck:     Vascular: No carotid bruit.  Cardiovascular:     Rate and Rhythm: Normal rate and regular rhythm.  Pulses: Normal pulses.     Heart sounds: Normal heart sounds.  Pulmonary:     Effort: Pulmonary effort is normal. No respiratory distress.     Breath sounds: Normal breath sounds.  Chest:     Chest wall: Tenderness present.  Abdominal:     General: Abdomen is flat. Bowel sounds are normal.     Palpations: Abdomen is soft.     Tenderness: There is no abdominal tenderness.  Musculoskeletal:        General: Tenderness (Lumbar midline and tenderness over left upper buttock.  Negative for trochanteric bursal tenderness.  Full range of motion of left and right hips.  Good range of motion of lumbar spine.  Negative straight leg raise bilaterally.) present.  Neurological:     Mental Status: She is alert and oriented to person, place, and time.  Psychiatric:        Mood and Affect: Mood normal.        Behavior: Behavior normal.    Diabetic Foot Exam - Simple   No data filed      Lab Results  Component Value Date   WBC 7.7 01/23/2021   HGB 13.1 01/23/2021   HCT 38.6 01/23/2021   PLT 232 01/23/2021   GLUCOSE 123 (H) 01/23/2021   CHOL 125 01/23/2021   TRIG 203 (H) 01/23/2021   HDL 37 (L) 01/23/2021   LDLCALC 55 01/23/2021   ALT 82 (H) 01/23/2021   AST 75 (H) 01/23/2021   NA 136 01/23/2021   K 4.4 01/23/2021   CL 98 01/23/2021   CREATININE 1.13 (H) 01/23/2021   BUN 21 01/23/2021   CO2 27 01/23/2021   TSH 4.400 05/17/2020   HGBA1C 6.3 (H) 01/23/2021      Assessment & Plan:   Problem List Items Addressed This Visit       Cardiovascular and Mediastinum   Coronary artery disease involving  native coronary artery of native heart without angina pectoris - Primary    Stable.  Continue beta-blocker, ARB, and aspirin.        Endocrine   Impaired fasting glucose    Recommend continue to work on eating healthy diet and exercise.       Relevant Orders   Hemoglobin A1c (Completed)     Other   Mixed hyperlipidemia    Recommend continue to work on eating healthy diet and exercise. Continue current medications. Await labs/testing for assessment and recommendations.       Relevant Orders   CBC with Differential/Platelet (Completed)   Comprehensive metabolic panel (Completed)   Lipid panel (Completed)   Lumbar back pain    Check lumbar xray Recommend heat.  Recommend exercises. Given.  Exercises given.  May use heat. Refuses physical therapy.       Relevant Orders   DG Lumbar Spine Complete   Left hip pain    Exercises given.  May use heat. Refuses physical therapy.      Fibromyalgia  .  No orders of the defined types were placed in this encounter.   Orders Placed This Encounter  Procedures   DG Lumbar Spine Complete   CBC with Differential/Platelet   Comprehensive metabolic panel   Lipid panel   Hemoglobin A1c     Follow-up: Return in about 3 months (around 04/23/2021) for chronic fasting.  An After Visit Summary was printed and given to the patient.    I,Lauren M Auman,acting as a scribe for [name], MD.,have documented all relevant documentation on the behalf  of [name], MD,as directed by  [name], MD while in the presence of [name], MD.    [name], MD [place] [phone]

## 2021-01-23 ENCOUNTER — Other Ambulatory Visit: Payer: Self-pay

## 2021-01-23 ENCOUNTER — Ambulatory Visit (INDEPENDENT_AMBULATORY_CARE_PROVIDER_SITE_OTHER): Payer: PPO | Admitting: Family Medicine

## 2021-01-23 ENCOUNTER — Encounter: Payer: Self-pay | Admitting: Family Medicine

## 2021-01-23 VITALS — BP 130/68 | HR 86 | Temp 98.1°F | Ht 62.0 in | Wt 264.0 lb

## 2021-01-23 DIAGNOSIS — M25552 Pain in left hip: Secondary | ICD-10-CM

## 2021-01-23 DIAGNOSIS — M47816 Spondylosis without myelopathy or radiculopathy, lumbar region: Secondary | ICD-10-CM | POA: Diagnosis not present

## 2021-01-23 DIAGNOSIS — R7301 Impaired fasting glucose: Secondary | ICD-10-CM | POA: Diagnosis not present

## 2021-01-23 DIAGNOSIS — M797 Fibromyalgia: Secondary | ICD-10-CM | POA: Diagnosis not present

## 2021-01-23 DIAGNOSIS — I2510 Atherosclerotic heart disease of native coronary artery without angina pectoris: Secondary | ICD-10-CM | POA: Diagnosis not present

## 2021-01-23 DIAGNOSIS — M545 Low back pain, unspecified: Secondary | ICD-10-CM

## 2021-01-23 DIAGNOSIS — E782 Mixed hyperlipidemia: Secondary | ICD-10-CM | POA: Diagnosis not present

## 2021-01-23 HISTORY — DX: Pain in left hip: M25.552

## 2021-01-23 HISTORY — DX: Low back pain, unspecified: M54.50

## 2021-01-23 NOTE — Assessment & Plan Note (Addendum)
Check lumbar xray Recommend heat.  Recommend exercises. Given.  Exercises given.  May use heat. Refuses physical therapy.

## 2021-01-23 NOTE — Assessment & Plan Note (Signed)
Stable.  Continue beta-blocker, ARB, and aspirin.

## 2021-01-23 NOTE — Assessment & Plan Note (Addendum)
Exercises given.  May use heat. Refuses physical therapy.

## 2021-01-24 LAB — LIPID PANEL
Chol/HDL Ratio: 3.4 ratio (ref 0.0–4.4)
Cholesterol, Total: 125 mg/dL (ref 100–199)
HDL: 37 mg/dL — ABNORMAL LOW (ref >39)
LDL Chol Calc (NIH): 55 mg/dL (ref 0–99)
Triglycerides: 203 mg/dL — ABNORMAL HIGH (ref 0–149)
VLDL Cholesterol Cal: 33 mg/dL (ref 5–40)

## 2021-01-24 LAB — COMPREHENSIVE METABOLIC PANEL
ALT: 82 IU/L — ABNORMAL HIGH (ref 0–32)
AST: 75 IU/L — ABNORMAL HIGH (ref 0–40)
Albumin/Globulin Ratio: 1.7 (ref 1.2–2.2)
Albumin: 4.5 g/dL (ref 3.8–4.8)
Alkaline Phosphatase: 105 IU/L (ref 44–121)
BUN/Creatinine Ratio: 19 (ref 12–28)
BUN: 21 mg/dL (ref 8–27)
Bilirubin Total: 0.2 mg/dL (ref 0.0–1.2)
CO2: 27 mmol/L (ref 20–29)
Calcium: 10.1 mg/dL (ref 8.7–10.3)
Chloride: 98 mmol/L (ref 96–106)
Creatinine, Ser: 1.13 mg/dL — ABNORMAL HIGH (ref 0.57–1.00)
Globulin, Total: 2.6 g/dL (ref 1.5–4.5)
Glucose: 123 mg/dL — ABNORMAL HIGH (ref 70–99)
Potassium: 4.4 mmol/L (ref 3.5–5.2)
Sodium: 136 mmol/L (ref 134–144)
Total Protein: 7.1 g/dL (ref 6.0–8.5)
eGFR: 53 mL/min/{1.73_m2} — ABNORMAL LOW (ref >59)

## 2021-01-24 LAB — CBC WITH DIFFERENTIAL/PLATELET
Basophils Absolute: 0 10*3/uL (ref 0.0–0.2)
Basos: 1 %
EOS (ABSOLUTE): 0.1 10*3/uL (ref 0.0–0.4)
Eos: 1 %
Hematocrit: 38.6 % (ref 34.0–46.6)
Hemoglobin: 13.1 g/dL (ref 11.1–15.9)
Immature Grans (Abs): 0 10*3/uL (ref 0.0–0.1)
Immature Granulocytes: 0 %
Lymphocytes Absolute: 2.8 10*3/uL (ref 0.7–3.1)
Lymphs: 37 %
MCH: 31.6 pg (ref 26.6–33.0)
MCHC: 33.9 g/dL (ref 31.5–35.7)
MCV: 93 fL (ref 79–97)
Monocytes Absolute: 0.6 10*3/uL (ref 0.1–0.9)
Monocytes: 8 %
Neutrophils Absolute: 4.1 10*3/uL (ref 1.4–7.0)
Neutrophils: 53 %
Platelets: 232 10*3/uL (ref 150–450)
RBC: 4.14 x10E6/uL (ref 3.77–5.28)
RDW: 13.9 % (ref 11.7–15.4)
WBC: 7.7 10*3/uL (ref 3.4–10.8)

## 2021-01-24 LAB — HEMOGLOBIN A1C
Est. average glucose Bld gHb Est-mCnc: 134 mg/dL
Hgb A1c MFr Bld: 6.3 % — ABNORMAL HIGH (ref 4.8–5.6)

## 2021-01-26 NOTE — Assessment & Plan Note (Signed)
Recommend continue to work on eating healthy diet and exercise.  

## 2021-01-26 NOTE — Assessment & Plan Note (Signed)
Recommend continue to work on eating healthy diet and exercise. Continue current medications. Await labs/testing for assessment and recommendations.

## 2021-01-29 NOTE — Progress Notes (Signed)
Blood count normal.  Liver function abnormal. stable Kidney function abnormal. Improved a little.  Cholesterol: trigs 203...increased. LDL good. Low fat diet. HBA1C: 6.3.

## 2021-02-03 ENCOUNTER — Other Ambulatory Visit: Payer: Self-pay

## 2021-02-03 DIAGNOSIS — M545 Low back pain, unspecified: Secondary | ICD-10-CM

## 2021-03-24 DIAGNOSIS — G8929 Other chronic pain: Secondary | ICD-10-CM | POA: Diagnosis not present

## 2021-03-24 DIAGNOSIS — H259 Unspecified age-related cataract: Secondary | ICD-10-CM | POA: Diagnosis not present

## 2021-03-24 DIAGNOSIS — J45909 Unspecified asthma, uncomplicated: Secondary | ICD-10-CM | POA: Diagnosis not present

## 2021-03-24 DIAGNOSIS — M109 Gout, unspecified: Secondary | ICD-10-CM | POA: Diagnosis not present

## 2021-03-24 DIAGNOSIS — Z6841 Body Mass Index (BMI) 40.0 and over, adult: Secondary | ICD-10-CM | POA: Diagnosis not present

## 2021-03-24 DIAGNOSIS — J309 Allergic rhinitis, unspecified: Secondary | ICD-10-CM | POA: Diagnosis not present

## 2021-03-24 DIAGNOSIS — K219 Gastro-esophageal reflux disease without esophagitis: Secondary | ICD-10-CM | POA: Diagnosis not present

## 2021-03-24 DIAGNOSIS — G4700 Insomnia, unspecified: Secondary | ICD-10-CM | POA: Diagnosis not present

## 2021-03-24 DIAGNOSIS — E559 Vitamin D deficiency, unspecified: Secondary | ICD-10-CM | POA: Diagnosis not present

## 2021-03-24 DIAGNOSIS — E785 Hyperlipidemia, unspecified: Secondary | ICD-10-CM | POA: Diagnosis not present

## 2021-03-24 DIAGNOSIS — I10 Essential (primary) hypertension: Secondary | ICD-10-CM | POA: Diagnosis not present

## 2021-03-27 ENCOUNTER — Telehealth: Payer: Self-pay

## 2021-03-27 NOTE — Chronic Care Management (AMB) (Signed)
? ? ?  Chronic Care Management ?Pharmacy Assistant  ? ?Name: Cheryl Beard  MRN: [id number] DOB: 1955-11-04 ? ?Reason for Encounter: Medication Coordination for [facility]  ?  ?Recent office visits:  ?01/23/21 [name] MD. Seen for CAD. No med changes.  ? ?Recent consult visits:  ?None ? ?Hospital visits:  ?None  ? ?Medications: ?Outpatient Encounter Medications as of 03/27/2021  ?Medication Sig Note  ? Albuterol Sulfate (PROAIR RESPICLICK) 409 (90 Base) MCG/ACT AEPB Inhale 2 puffs into the lungs as needed (for wheezing).   ? allopurinol (ZYLOPRIM) 300 MG tablet Take 1 tablet (300 mg total) by mouth daily.   ? amitriptyline (ELAVIL) 25 MG tablet TAKE ONE TABLET BY MOUTH EVERYDAY AT BEDTIME   ? aspirin EC 81 MG tablet Take 81 mg by mouth daily.   ? calcium carbonate (TUMS - DOSED IN MG ELEMENTAL CALCIUM) 500 MG chewable tablet Chew 1 tablet by mouth 2 (two) times daily.   ? chlorthalidone (HYGROTON) 50 MG tablet Take 1 tablet (50 mg total) by mouth daily.   ? cholecalciferol (VITAMIN D3) 25 MCG (1000 UNIT) tablet Take 1,000 Units by mouth daily.   ? EDARBI 80 MG TABS TAKE 1 TABLET BY MOUTH ONCE DAILY   ? metoprolol succinate (TOPROL-XL) 25 MG 24 hr tablet Take 1 tablet (25 mg total) by mouth daily.   ? Multiple Vitamins-Minerals (WOMENS MULTIVITAMIN PLUS PO) Take 1 tablet by mouth every morning.   ? Omega-3 Fatty Acids (FISH OIL) 1000 MG CAPS Take 2 capsules by mouth daily. 2 capsules am and 1 capsule pm 08/05/2020: Patient unable to tolerate 2 capsules am and pm due to belching fish taste at night if taking 2. Discussed trying to store fish oil in refrigerator to reduce belching fishy taste.   ? pantoprazole (PROTONIX) 40 MG tablet Take 1 tablet (40 mg total) by mouth daily.   ? rosuvastatin (CRESTOR) 20 MG tablet TAKE ONE TABLET BY MOUTH EVERYDAY AT BEDTIME   ? ?No facility-administered encounter medications on file as of 03/27/2021.  ? ? ?Reviewed chart for medication changes ahead of medication coordination  call. ? ?No  Consults, or hospital visits since last care coordination call/Pharmacist visit.  ? ?No medication changes indicated OR if recent visit, treatment plan here. ? ?BP Readings from Last 3 Encounters:  ?01/23/21 130/68  ?09/20/20 110/64  ?06/04/20 124/72  ?  ?Lab Results  ?Component Value Date  ? HGBA1C 6.3 (H) 01/23/2021  ?  ? ?Patient obtains medications through Adherence Packaging  90 Days  ? ?Last adherence delivery included:  ?Allopurinol 300mg -breakfast ?Amitriptyline 25mg -bedtime ?Chlorthalidone 50mg -breakfast ?Metoprolol ER Succinate 25mg -1 at breakfast ?Rosuvastatin 20mg -bedtime    ?Pantoprazole 40mg -1 evening meal  ? ?Patient declined (meds) last delivery ?Albuterol Sulfate 108- Does not need, only uses PRN  ?Edarbi 80 mg- Gets at another pharmacy cheaper ? ?Patient is due for next adherence delivery on: 04/08/21. ?Called patient and reviewed medications and coordinated delivery. ? ?This delivery to include: ?Allopurinol 300mg -breakfast ?Amitriptyline 25mg -bedtime ?Chlorthalidone 50mg -breakfast ?Metoprolol ER Succinate 25mg -1 at breakfast ?Rosuvastatin 20mg -bedtime    ?Pantoprazole 40mg -1 evening meal  ? ?Patient declined the following medications  ?Albuterol Sulfate 108- Does not need, only uses PRN  ?Edarbi 80 mg- Gets at another pharmacy cheaper ? ?Patient needs refills  ?Pantoprazole 40mg  ?Amitriptyline 25mg  ? ?Confirmed delivery date of 04/08/21, advised patient that pharmacy will contact them the morning of delivery. ? ? ?Elray Mcgregor, CMA ?Clinical Pharmacist Assistant  ?[phone]  ?

## 2021-03-31 NOTE — Telephone Encounter (Signed)
Compliant on meds 

## 2021-04-03 ENCOUNTER — Other Ambulatory Visit: Payer: Self-pay | Admitting: Family Medicine

## 2021-04-03 DIAGNOSIS — M797 Fibromyalgia: Secondary | ICD-10-CM

## 2021-05-01 ENCOUNTER — Encounter: Payer: Self-pay | Admitting: Family Medicine

## 2021-05-01 ENCOUNTER — Ambulatory Visit (INDEPENDENT_AMBULATORY_CARE_PROVIDER_SITE_OTHER): Payer: PPO | Admitting: Family Medicine

## 2021-05-01 VITALS — BP 132/74 | HR 84 | Resp 18 | Ht 63.25 in | Wt 265.0 lb

## 2021-05-01 DIAGNOSIS — Z6841 Body Mass Index (BMI) 40.0 and over, adult: Secondary | ICD-10-CM

## 2021-05-01 DIAGNOSIS — J4520 Mild intermittent asthma, uncomplicated: Secondary | ICD-10-CM | POA: Diagnosis not present

## 2021-05-01 DIAGNOSIS — K219 Gastro-esophageal reflux disease without esophagitis: Secondary | ICD-10-CM | POA: Diagnosis not present

## 2021-05-01 DIAGNOSIS — I2510 Atherosclerotic heart disease of native coronary artery without angina pectoris: Secondary | ICD-10-CM

## 2021-05-01 DIAGNOSIS — I10 Essential (primary) hypertension: Secondary | ICD-10-CM

## 2021-05-01 DIAGNOSIS — R7303 Prediabetes: Secondary | ICD-10-CM

## 2021-05-01 DIAGNOSIS — M797 Fibromyalgia: Secondary | ICD-10-CM

## 2021-05-01 DIAGNOSIS — M1A0720 Idiopathic chronic gout, left ankle and foot, without tophus (tophi): Secondary | ICD-10-CM

## 2021-05-01 DIAGNOSIS — E782 Mixed hyperlipidemia: Secondary | ICD-10-CM

## 2021-05-01 DIAGNOSIS — I119 Hypertensive heart disease without heart failure: Secondary | ICD-10-CM

## 2021-05-01 NOTE — Progress Notes (Signed)
? ?Subjective:  ?Patient ID: Cheryl Beard, female    DOB: [birth year]  Age: 66 y.o. MRN: [id number] ? ?Chief Complaint  ?Patient presents with  ? Hypertension  ? Hyperlipidemia  ? ? ?Hyperlipidemia:  ?Patient is currently taking Rosuvastatin 20 mg take 1 tablet daily, OTC fish oil 1,000 mg take 2 capsules twice daily. ? ?Hypertension: ?Patient is currently taking Metoprolol 25 mg take 1 tablet daily, Chlorthalidone 50 mg take 1 tablet daily, Edarbi 80 mg take 1 tablet daily. ? ?Prediabetes: ?Last A1c: 6.3 ? ?Diet: tries to eat healthy. No much exercise as her husband has been sick.  ? ?GERD: Patient is currently taking Pantoprazole 40 mg take 1 tablet daily. ? ?Asthma, mild intermittetn. Using albuterol (proair) seasonally.  ?  ?Current Outpatient Medications on File Prior to Visit  ?Medication Sig Dispense Refill  ? Albuterol Sulfate (PROAIR RESPICLICK) 893 (90 Base) MCG/ACT AEPB Inhale 2 puffs into the lungs as needed (for wheezing). 1 each 2  ? allopurinol (ZYLOPRIM) 300 MG tablet Take 1 tablet (300 mg total) by mouth daily. 90 tablet 3  ? amitriptyline (ELAVIL) 25 MG tablet TAKE ONE TABLET BY MOUTH EVERYDAY AT BEDTIME 90 tablet 0  ? aspirin EC 81 MG tablet Take 81 mg by mouth daily.    ? calcium carbonate (TUMS - DOSED IN MG ELEMENTAL CALCIUM) 500 MG chewable tablet Chew 1 tablet by mouth 2 (two) times daily.    ? chlorthalidone (HYGROTON) 50 MG tablet Take 1 tablet (50 mg total) by mouth daily. 90 tablet 1  ? cholecalciferol (VITAMIN D3) 25 MCG (1000 UNIT) tablet Take 1,000 Units by mouth daily.    ? EDARBI 80 MG TABS TAKE 1 TABLET BY MOUTH ONCE DAILY 90 tablet 3  ? metoprolol succinate (TOPROL-XL) 25 MG 24 hr tablet Take 1 tablet (25 mg total) by mouth daily. 90 tablet 1  ? Multiple Vitamins-Minerals (WOMENS MULTIVITAMIN PLUS PO) Take 1 tablet by mouth every morning.    ? Omega-3 Fatty Acids (FISH OIL) 1000 MG CAPS Take 2 capsules by mouth daily. 2 capsules am and 1 capsule pm    ? pantoprazole (PROTONIX)  40 MG tablet TAKE ONE TABLET BY MOUTH EVERY EVENING 90 tablet 0  ? rosuvastatin (CRESTOR) 20 MG tablet TAKE ONE TABLET BY MOUTH EVERYDAY AT BEDTIME 90 tablet 1  ? ?No current facility-administered medications on file prior to visit.  ? ?Past Medical History:  ?Diagnosis Date  ? Allergy   ? CAD (coronary artery disease)   ? Depression   ? Erosive esophagitis   ? Erosive gastritis   ? Gallbladder disease   ? GERD (gastroesophageal reflux disease)   ? History of Clostridium difficile infection   ? History of COVID-19   ? History of myocardial infarction   ? Hyperlipidemia   ? Hypertension   ? Hypertensive urgency   ? Idiopathic gout   ? Myocardial infarction [facility])   ? Nontoxic multinodular goiter   ? Other asthma   ? Vitamin D deficiency   ? ?Past Surgical History:  ?Procedure Laterality Date  ? ABDOMINAL HYSTERECTOMY    ? APPENDECTOMY    ? CHOLECYSTECTOMY    ? ROTATOR CUFF REPAIR    ?  ?Family History  ?Problem Relation Age of Onset  ? Cancer Mother   ? Parkinson's disease Mother   ? Diabetes Mother   ? Hypertension Mother   ? Stroke Father   ? Hyperlipidemia Sister   ? Hypertension Sister   ? Alzheimer's disease  Maternal Grandmother   ? Fibromyalgia Sister   ? Cancer Sister   ? Cancer Paternal Grandmother   ?     Leukemia  ? ?Social History  ? ?Socioeconomic History  ? Marital status: Married  ?  Spouse name: Not on file  ? Number of children: 3  ? Years of education: Not on file  ? Highest education level: Not on file  ?Occupational History  ? Not on file  ?Tobacco Use  ? Smoking status: Former  ?  Types: Cigarettes  ?  Quit date: 2002  ?  Years since quitting: 21.2  ? Smokeless tobacco: Never  ?Substance and Sexual Activity  ? Alcohol use: Never  ? Drug use: Never  ? Sexual activity: Not on file  ?Other Topics Concern  ? Not on file  ?Social History Narrative  ? Not on file  ? ?Social Determinants of Health  ? ?Financial Resource Strain: Not on file  ?Food Insecurity: Not on file  ?Transportation Needs: Not on file   ?Physical Activity: Not on file  ?Stress: Not on file  ?Social Connections: Not on file  ? ? ?Review of Systems  ?Constitutional:  Negative for appetite change, fatigue and fever.  ?HENT:  Negative for congestion, ear pain, sinus pressure and sore throat.   ?Respiratory:  Negative for cough, chest tightness, shortness of breath and wheezing.   ?Cardiovascular:  Negative for chest pain and palpitations.  ?Gastrointestinal:  Negative for abdominal pain, constipation, diarrhea, nausea and vomiting.  ?Genitourinary:  Negative for dysuria and hematuria.  ?Musculoskeletal:  Negative for arthralgias, back pain, joint swelling and myalgias.  ?Skin:  Negative for rash.  ?Neurological:  Negative for dizziness, weakness and headaches.  ?Psychiatric/Behavioral:  Negative for dysphoric mood. The patient is not nervous/anxious.   ? ? ?Objective:  ?BP 132/74   Pulse 84   Resp 18   Ht 5' 3.25" (1.607 m)   Wt 265 lb (120.2 kg)   SpO2 97%   BMI 46.57 kg/m?  ? ? ?  05/01/2021  ?  9:17 AM 01/23/2021  ?  9:18 AM 09/20/2020  ? 10:12 AM  ?BP/Weight  ?Systolic BP 694 854 627  ?Diastolic BP 74 68 64  ?Wt. (Lbs) 265 264 267  ?BMI 46.57 kg/m2 48.29 kg/m2 48.83 kg/m2  ? ? ?Physical Exam ?Vitals reviewed.  ?Constitutional:   ?   Appearance: Normal appearance. She is obese.  ?Neck:  ?   Vascular: No carotid bruit.  ?Cardiovascular:  ?   Rate and Rhythm: Normal rate and regular rhythm.  ?   Pulses: Normal pulses.  ?   Heart sounds: Normal heart sounds.  ?Pulmonary:  ?   Effort: Pulmonary effort is normal. No respiratory distress.  ?   Breath sounds: Normal breath sounds.  ?Abdominal:  ?   General: Abdomen is flat. Bowel sounds are normal.  ?   Palpations: Abdomen is soft.  ?   Tenderness: There is no abdominal tenderness.  ?Neurological:  ?   Mental Status: She is alert and oriented to person, place, and time.  ?Psychiatric:     ?   Mood and Affect: Mood normal.     ?   Behavior: Behavior normal.  ? ? ?Diabetic Foot Exam - Simple   ?No data  filed ?  ?  ? ?Lab Results  ?Component Value Date  ? WBC 9.4 05/01/2021  ? HGB 13.4 05/01/2021  ? HCT 39.6 05/01/2021  ? PLT 225 05/01/2021  ? GLUCOSE 94 05/01/2021  ?  CHOL 107 05/01/2021  ? TRIG 130 05/01/2021  ? HDL 38 (L) 05/01/2021  ? LDLCALC 46 05/01/2021  ? ALT 55 (H) 05/01/2021  ? AST 45 (H) 05/01/2021  ? NA 143 05/01/2021  ? K 4.6 05/01/2021  ? CL 102 05/01/2021  ? CREATININE 1.06 (H) 05/01/2021  ? BUN 17 05/01/2021  ? CO2 25 05/01/2021  ? TSH 4.400 05/17/2020  ? HGBA1C 6.0 (H) 05/01/2021  ? ? ? ? ?Assessment & Plan:  ? ?Problem List Items Addressed This Visit   ? ?  ? Cardiovascular and Mediastinum  ? Hypertensive heart disease without heart failure - Primary  ?  BP low normal.  ?Decrease chlorthalidone 50 mg 1/2 daily.  ?Recommend continue to work on eating healthy diet and exercise. ?Labs drawn today.  ?  ?  ? Relevant Orders  ? CBC With Diff/Platelet (Completed)  ? Comprehensive metabolic panel (Completed)  ? Coronary artery disease involving native coronary artery of native heart without angina pectoris  ?  Noncritical coronary stenosis. ?The current medical regimen is effective;  continue present plan and medications. ?Recommend continue to work on eating healthy diet and exercise. ? ?  ?  ?  ? Respiratory  ? Uncomplicated asthma  ?  The current medical regimen is effective;  continue present plan and medications. ? ?  ?  ?  ? Digestive  ? GERD without esophagitis  ?  The current medical regimen is effective;  continue present plan and medications. ?Continue protonix.  ?  ?  ?  ? Musculoskeletal and Integument  ? Chronic idiopathic gout involving toe of left foot without tophus  ?  The current medical regimen is effective;  continue present plan and medications. Continue allopurinol 300 mg once daily ?  ?  ?  ? Other  ? Fibromyalgia  ?  The current medical regimen is fairly effective;  continue present plan and medications. ?Recommend continue to work on eating healthy diet and exercise. ?  ?  ? Morbid  obesity ([place])  ?  Recommend continue to work on eating healthy diet and exercise. ? ?  ?  ? BMI 45.0-49.9, adult ([place])  ?  Recommend continue to work on eating healthy diet and exercise. ? ?  ?  ? Mixed hy

## 2021-05-01 NOTE — Patient Instructions (Addendum)
Decrease chlorthalidone 50 mg 1/2 daily.  ? ?Recommend get Shingrix vaccines and Tetanus (TDAP) at the pharmacy.  ? ?Recommend continue to work on eating healthy diet and exercise. ? ?

## 2021-05-02 LAB — COMPREHENSIVE METABOLIC PANEL
ALT: 55 IU/L — ABNORMAL HIGH (ref 0–32)
AST: 45 IU/L — ABNORMAL HIGH (ref 0–40)
Albumin/Globulin Ratio: 1.8 (ref 1.2–2.2)
Albumin: 4.4 g/dL (ref 3.8–4.8)
Alkaline Phosphatase: 98 IU/L (ref 44–121)
BUN/Creatinine Ratio: 16 (ref 12–28)
BUN: 17 mg/dL (ref 8–27)
Bilirubin Total: 0.3 mg/dL (ref 0.0–1.2)
CO2: 25 mmol/L (ref 20–29)
Calcium: 9.5 mg/dL (ref 8.7–10.3)
Chloride: 102 mmol/L (ref 96–106)
Creatinine, Ser: 1.06 mg/dL — ABNORMAL HIGH (ref 0.57–1.00)
Globulin, Total: 2.5 g/dL (ref 1.5–4.5)
Glucose: 94 mg/dL (ref 70–99)
Potassium: 4.6 mmol/L (ref 3.5–5.2)
Sodium: 143 mmol/L (ref 134–144)
Total Protein: 6.9 g/dL (ref 6.0–8.5)
eGFR: 58 mL/min/{1.73_m2} — ABNORMAL LOW (ref >59)

## 2021-05-02 LAB — LIPID PANEL
Chol/HDL Ratio: 2.8 ratio (ref 0.0–4.4)
Cholesterol, Total: 107 mg/dL (ref 100–199)
HDL: 38 mg/dL — ABNORMAL LOW (ref >39)
LDL Chol Calc (NIH): 46 mg/dL (ref 0–99)
Triglycerides: 130 mg/dL (ref 0–149)
VLDL Cholesterol Cal: 23 mg/dL (ref 5–40)

## 2021-05-02 LAB — CBC WITH DIFF/PLATELET
Basophils Absolute: 0 10*3/uL (ref 0.0–0.2)
Basos: 0 %
EOS (ABSOLUTE): 0.1 10*3/uL (ref 0.0–0.4)
Eos: 1 %
Hematocrit: 39.6 % (ref 34.0–46.6)
Hemoglobin: 13.4 g/dL (ref 11.1–15.9)
Immature Grans (Abs): 0 10*3/uL (ref 0.0–0.1)
Immature Granulocytes: 0 %
Lymphocytes Absolute: 3.4 10*3/uL — ABNORMAL HIGH (ref 0.7–3.1)
Lymphs: 36 %
MCH: 31.5 pg (ref 26.6–33.0)
MCHC: 33.8 g/dL (ref 31.5–35.7)
MCV: 93 fL (ref 79–97)
Monocytes Absolute: 0.6 10*3/uL (ref 0.1–0.9)
Monocytes: 7 %
Neutrophils Absolute: 5.2 10*3/uL (ref 1.4–7.0)
Neutrophils: 56 %
Platelets: 225 10*3/uL (ref 150–450)
RBC: 4.26 x10E6/uL (ref 3.77–5.28)
RDW: 13.6 % (ref 11.7–15.4)
WBC: 9.4 10*3/uL (ref 3.4–10.8)

## 2021-05-02 LAB — HEMOGLOBIN A1C
Est. average glucose Bld gHb Est-mCnc: 126 mg/dL
Hgb A1c MFr Bld: 6 % — ABNORMAL HIGH (ref 4.8–5.6)

## 2021-05-02 LAB — CARDIOVASCULAR RISK ASSESSMENT

## 2021-05-05 DIAGNOSIS — R7303 Prediabetes: Secondary | ICD-10-CM

## 2021-05-05 HISTORY — DX: Prediabetes: R73.03

## 2021-05-05 NOTE — Assessment & Plan Note (Signed)
Hemoglobin A1c 6.3%, 3 month avg of blood sugars, is in prediabetic range.  In order to prevent progression to diabetes, recommend low carb diet and regular exercise  

## 2021-05-05 NOTE — Assessment & Plan Note (Addendum)
Well controlled.  ?No changes to medicines. currently taking Rosuvastatin 20 mg take 1 tablet daily, OTC fish oil 1,000 mg take 2 capsules twice daily. ?Continue to work on eating a healthy diet and exercise.  ?Labs drawn today.  ?

## 2021-05-05 NOTE — Assessment & Plan Note (Addendum)
The current medical regimen is effective;  continue present plan and medications. Continue allopurinol 300 mg once daily ?

## 2021-05-05 NOTE — Assessment & Plan Note (Addendum)
The current medical regimen is fairly effective;  continue present plan and medications. ?Recommend continue to work on eating healthy diet and exercise. ?

## 2021-05-05 NOTE — Assessment & Plan Note (Addendum)
BP low normal.  ?Decrease chlorthalidone 50 mg 1/2 daily.  ?Recommend continue to work on eating healthy diet and exercise. ?Labs drawn today.  ?

## 2021-05-06 NOTE — Assessment & Plan Note (Signed)
Noncritical coronary stenosis. ?The current medical regimen is effective;  continue present plan and medications. ?Recommend continue to work on eating healthy diet and exercise. ? ?

## 2021-05-06 NOTE — Assessment & Plan Note (Signed)
Recommend continue to work on eating healthy diet and exercise.  

## 2021-05-06 NOTE — Assessment & Plan Note (Signed)
The current medical regimen is effective;  continue present plan and medications.  

## 2021-05-06 NOTE — Assessment & Plan Note (Signed)
The current medical regimen is effective;  continue present plan and medications. Continue protonix.  

## 2021-05-28 DIAGNOSIS — Z1231 Encounter for screening mammogram for malignant neoplasm of breast: Secondary | ICD-10-CM

## 2021-06-06 ENCOUNTER — Encounter: Payer: Self-pay | Admitting: Family Medicine

## 2021-06-06 ENCOUNTER — Telehealth: Payer: Self-pay

## 2021-06-06 ENCOUNTER — Ambulatory Visit: Payer: PPO | Admitting: Family Medicine

## 2021-06-06 VITALS — BP 117/70 | Ht 62.0 in | Wt 265.0 lb

## 2021-06-06 DIAGNOSIS — Z0000 Encounter for general adult medical examination without abnormal findings: Secondary | ICD-10-CM

## 2021-06-06 MED ORDER — CHLORTHALIDONE 25 MG PO TABS: 25.0000 mg | ORAL_TABLET | Freq: Every day | ORAL | 0 refills | Status: DC

## 2021-06-06 NOTE — Patient Instructions (Addendum)
Cheryl Beard , ?Thank you for taking time to come for your Medicare Wellness Visit. I appreciate your ongoing commitment to your health goals. Please review the following plan we discussed and let me know if I can assist you in the future.  ? ?Screening recommendations/referrals: ?Colonoscopy: Last 2022- repeat in 10 years or at age 66. ?Mammogram: Nov 2023 ?Bone Density: Jan 2022- mild- takes vit D and Ca ?Recommended yearly ophthalmology/optometry visit for glaucoma screening and checkup ?Recommended yearly dental visit for hygiene and checkup ? ?Vaccinations: ?Influenza vaccine: 08/2021 ?Pneumococcal vaccine: Completed ?Tdap vaccine: [facility]- Tdap and And first Shingles at start April  ?Shingles vaccine:   see above ? ?Advanced directives: bring living will or health care power attorney  ? ?Conditions/risks identified: Obesity ? ?Next appointment: August 2023 ? ? ?Preventive Care 95 Years and Older, Female ?Preventive care refers to lifestyle choices and visits with your health care provider that can promote health and wellness. ?What does preventive care include? ?A yearly physical exam. This is also called an annual well check. ?Dental exams once or twice a year. ?Routine eye exams. Ask your health care provider how often you should have your eyes checked. ?Personal lifestyle choices, including: ?Daily care of your teeth and gums. ?Regular physical activity. ?Eating a healthy diet. ?Avoiding tobacco and drug use. ?Limiting alcohol use. ?Practicing safe sex. ?Taking low-dose aspirin every day. ?Taking vitamin and mineral supplements as recommended by your health care provider. ?What happens during an annual well check? ?The services and screenings done by your health care provider during your annual well check will depend on your age, overall health, lifestyle risk factors, and family history of disease. ?Counseling  ?Your health care provider may ask you questions about your: ?Alcohol use. ?Tobacco use. ?Drug  use. ?Emotional well-being. ?Home and relationship well-being. ?Sexual activity. ?Eating habits. ?History of falls. ?Memory and ability to understand (cognition). ?Work and work Statistician. ?Reproductive health. ?Screening  ?You may have the following tests or measurements: ?Height, weight, and BMI. ?Blood pressure. ?Lipid and cholesterol levels. These may be checked every 5 years, or more frequently if you are over 35 years old. ?Skin check. ?Lung cancer screening. You may have this screening every year starting at age 19 if you have a 30-pack-year history of smoking and currently smoke or have quit within the past 15 years. ?Fecal occult blood test (FOBT) of the stool. You may have this test every year starting at age 69. ?Flexible sigmoidoscopy or colonoscopy. You may have a sigmoidoscopy every 5 years or a colonoscopy every 10 years starting at age 24. ?Hepatitis C blood test. ?Hepatitis B blood test. ?Sexually transmitted disease (STD) testing. ?Diabetes screening. This is done by checking your blood sugar (glucose) after you have not eaten for a while (fasting). You may have this done every 1-3 years. ?Bone density scan. This is done to screen for osteoporosis. You may have this done starting at age 41. ?Mammogram. This may be done every 1-2 years. Talk to your health care provider about how often you should have regular mammograms. ?Talk with your health care provider about your test results, treatment options, and if necessary, the need for more tests. ?Vaccines  ?Your health care provider may recommend certain vaccines, such as: ?Influenza vaccine. This is recommended every year. ?Tetanus, diphtheria, and acellular pertussis (Tdap, Td) vaccine. You may need a Td booster every 10 years. ?Zoster vaccine. You may need this after age 40. ?Pneumococcal 13-valent conjugate (PCV13) vaccine. One dose is recommended after  age 54. ?Pneumococcal polysaccharide (PPSV23) vaccine. One dose is recommended after age  28. ?Talk to your health care provider about which screenings and vaccines you need and how often you need them. ?This information is not intended to replace advice given to you by your health care provider. Make sure you discuss any questions you have with your health care provider. ?Document Released: 02/08/2015 Document Revised: 10/02/2015 Document Reviewed: 11/13/2014 ?Elsevier Interactive Patient Education ? 2017 [place]. ? ?Fall Prevention in the Home ?Falls can cause injuries. They can happen to people of all ages. There are many things you can do to make your home safe and to help prevent falls. ?What can I do on the outside of my home? ?Regularly fix the edges of walkways and driveways and fix any cracks. ?Remove anything that might make you trip as you walk through a door, such as a raised step or threshold. ?Trim any bushes or trees on the path to your home. ?Use bright outdoor lighting. ?Clear any walking paths of anything that might make someone trip, such as rocks or tools. ?Regularly check to see if handrails are loose or broken. Make sure that both sides of any steps have handrails. ?Any raised decks and porches should have guardrails on the edges. ?Have any leaves, snow, or ice cleared regularly. ?Use sand or salt on walking paths during winter. ?Clean up any spills in your garage right away. This includes oil or grease spills. ?What can I do in the bathroom? ?Use night lights. ?Install grab bars by the toilet and in the tub and shower. Do not use towel bars as grab bars. ?Use non-skid mats or decals in the tub or shower. ?If you need to sit down in the shower, use a plastic, non-slip stool. ?Keep the floor dry. Clean up any water that spills on the floor as soon as it happens. ?Remove soap buildup in the tub or shower regularly. ?Attach bath mats securely with double-sided non-slip rug tape. ?Do not have throw rugs and other things on the floor that can make you trip. ?What can I do in the  bedroom? ?Use night lights. ?Make sure that you have a light by your bed that is easy to reach. ?Do not use any sheets or blankets that are too big for your bed. They should not hang down onto the floor. ?Have a firm chair that has side arms. You can use this for support while you get dressed. ?Do not have throw rugs and other things on the floor that can make you trip. ?What can I do in the kitchen? ?Clean up any spills right away. ?Avoid walking on wet floors. ?Keep items that you use a lot in easy-to-reach places. ?If you need to reach something above you, use a strong step stool that has a grab bar. ?Keep electrical cords out of the way. ?Do not use floor polish or wax that makes floors slippery. If you must use wax, use non-skid floor wax. ?Do not have throw rugs and other things on the floor that can make you trip. ?What can I do with my stairs? ?Do not leave any items on the stairs. ?Make sure that there are handrails on both sides of the stairs and use them. Fix handrails that are broken or loose. Make sure that handrails are as long as the stairways. ?Check any carpeting to make sure that it is firmly attached to the stairs. Fix any carpet that is loose or worn. ?Avoid having throw rugs  at the top or bottom of the stairs. If you do have throw rugs, attach them to the floor with carpet tape. ?Make sure that you have a light switch at the top of the stairs and the bottom of the stairs. If you do not have them, ask someone to add them for you. ?What else can I do to help prevent falls? ?Wear shoes that: ?Do not have high heels. ?Have rubber bottoms. ?Are comfortable and fit you well. ?Are closed at the toe. Do not wear sandals. ?If you use a stepladder: ?Make sure that it is fully opened. Do not climb a closed stepladder. ?Make sure that both sides of the stepladder are locked into place. ?Ask someone to hold it for you, if possible. ?Clearly mark and make sure that you can see: ?Any grab bars or  handrails. ?First and last steps. ?Where the edge of each step is. ?Use tools that help you move around (mobility aids) if they are needed. These include: ?Canes. ?Walkers. ?Scooters. ?Crutches. ?Turn on the lights when y

## 2021-06-06 NOTE — Progress Notes (Signed)
? ?Annual Wellness Visit ? ? I connected with  [name] on 06/06/21 by a video and audio enabled telemedicine application and verified that I am speaking with the correct person using two identifiers. ? ?Patient Location: Home ? ?Provider Location: Home Office ? ?I discussed the limitations of evaluation and management by telemedicine. The patient expressed understanding and agreed to proceed. ? ? ?Patient: Cheryl Beard, Female    DOB: 30-Jul-1955, 66 y.o.   MRN: [id number] ? ?Subjective  ?No chief complaint on file. ? ? ?Cheryl Beard is a 66 y.o. female who presents today for her Annual Wellness Visit. ?She reports consuming a low fat and low sodium diet. Exercise is limited by orthopedic condition(s): hurt her hip recently from a fall down stairs and vision. She generally feels fairly well. She reports sleeping fairly well. She does not have additional problems to discuss today.  ? ?Vision:within the last year, has cataracts and Dental: No current dental problems and Receives regular dental care getting a partial made now and two front teeth filled and broken them. ? ?Only reports today that BP is running on lower end when she is taking it at home SBP reported by pt to be at 90-110 range. Per pt she was asked to cut her Chlorthalidone in half but her pill is too small when she tries to cut it, it turns to powder. Would like to see what can be down for a reduced dose and or med adjustment.  ? ?Patient Active Problem List  ? Diagnosis Date Noted  ? Prediabetes 05/05/2021  ? Lumbar back pain 01/23/2021  ? Left hip pain 01/23/2021  ? Osteopenia 12/13/2019  ? Uncomplicated asthma 87/56/4332  ? Morbid obesity ([place]) 05/14/2019  ? BMI 45.0-49.9, adult ([place]) 05/14/2019  ? Hypertensive heart disease without heart failure 05/10/2019  ? Mixed hyperlipidemia 05/10/2019  ? Nontoxic multinodular goiter 05/10/2019  ? Chronic idiopathic gout involving toe of left foot without tophus 05/10/2019  ? GERD without  esophagitis 05/10/2019  ? Telogen effluvium 05/10/2019  ? Fibromyalgia 09/11/2014  ? Coronary artery disease involving native coronary artery of native heart without angina pectoris 08/14/2014  ? Dyslipidemia 08/14/2014  ? ?Past Medical History:  ?Diagnosis Date  ? Allergy   ? CAD (coronary artery disease)   ? Depression   ? Erosive esophagitis   ? Erosive gastritis   ? Gallbladder disease   ? GERD (gastroesophageal reflux disease)   ? History of Clostridium difficile infection   ? History of COVID-19   ? History of myocardial infarction   ? Hyperlipidemia   ? Hypertension   ? Hypertensive urgency   ? Idiopathic gout   ? Myocardial infarction [facility])   ? Nontoxic multinodular goiter   ? Other asthma   ? Vitamin D deficiency   ? ?Past Surgical History:  ?Procedure Laterality Date  ? ABDOMINAL HYSTERECTOMY    ? APPENDECTOMY    ? CHOLECYSTECTOMY    ? ROTATOR CUFF REPAIR    ? ?Social History  ? ?Tobacco Use  ? Smoking status: Former  ?  Types: Cigarettes  ?  Quit date: 2002  ?  Years since quitting: 21.3  ? Smokeless tobacco: Never  ?Substance Use Topics  ? Alcohol use: Never  ? Drug use: Never  ? ?Allergies  ?Allergen Reactions  ? Candesartan   ? Clarithromycin   ? Colesevelam   ? Duloxetine   ? Lovaza [Omega-3-Acid Ethyl Esters] Nausea Only  ?  GI upset  ?  Pregabalin   ? ?  ? ?Medications: ?Outpatient Medications Prior to Visit  ?Medication Sig  ? Albuterol Sulfate (PROAIR RESPICLICK) 073 (90 Base) MCG/ACT AEPB Inhale 2 puffs into the lungs as needed (for wheezing).  ? allopurinol (ZYLOPRIM) 300 MG tablet Take 1 tablet (300 mg total) by mouth daily.  ? amitriptyline (ELAVIL) 25 MG tablet TAKE ONE TABLET BY MOUTH EVERYDAY AT BEDTIME  ? aspirin EC 81 MG tablet Take 81 mg by mouth daily.  ? calcium carbonate (TUMS - DOSED IN MG ELEMENTAL CALCIUM) 500 MG chewable tablet Chew 1 tablet by mouth 2 (two) times daily.  ? chlorthalidone (HYGROTON) 50 MG tablet Take 1 tablet (50 mg total) by mouth daily.  ? cholecalciferol  (VITAMIN D3) 25 MCG (1000 UNIT) tablet Take 1,000 Units by mouth daily.  ? EDARBI 80 MG TABS TAKE 1 TABLET BY MOUTH ONCE DAILY  ? metoprolol succinate (TOPROL-XL) 25 MG 24 hr tablet Take 1 tablet (25 mg total) by mouth daily.  ? Multiple Vitamins-Minerals (WOMENS MULTIVITAMIN PLUS PO) Take 1 tablet by mouth every morning.  ? Omega-3 Fatty Acids (FISH OIL) 1000 MG CAPS Take 2 capsules by mouth daily. 2 capsules am and 1 capsule pm  ? pantoprazole (PROTONIX) 40 MG tablet TAKE ONE TABLET BY MOUTH EVERY EVENING  ? rosuvastatin (CRESTOR) 20 MG tablet TAKE ONE TABLET BY MOUTH EVERYDAY AT BEDTIME  ? ?No facility-administered medications prior to visit.  ?  ?Allergies  ?Allergen Reactions  ? Candesartan   ? Clarithromycin   ? Colesevelam   ? Duloxetine   ? Lovaza [Omega-3-Acid Ethyl Esters] Nausea Only  ?  GI upset  ? Pregabalin   ? ? ?Patient Care Team: ?CoxElnita Maxwell, MD as PCP - General (Family Medicine) ?Burnice Logan, [place] (Inactive) as Pharmacist (Pharmacist) ? ? ? ? ?  ? ?Objective  ?BP 117/70   Ht $R'5\' 2"'Zf$  (1.575 m)   Wt 265 lb (120.2 kg)   BMI 48.47 kg/m?  ?BP Readings from Last 3 Encounters:  ?06/06/21 117/70  ?05/01/21 132/74  ?01/23/21 130/68  ? ?Wt Readings from Last 3 Encounters:  ?06/06/21 265 lb (120.2 kg)  ?05/01/21 265 lb (120.2 kg)  ?01/23/21 264 lb (119.7 kg)  ? ?  ? ? ? ?Most recent functional status assessment: ? ?  06/04/2021  ?  1:00 PM  ?In your present state of health, do you have any difficulty performing the following activities:  ?Hearing? 0  ?Vision? 1  ?Difficulty concentrating or making decisions? 0  ?Walking or climbing stairs? 0  ?Dressing or bathing? 0  ?Doing errands, shopping? 0  ?Preparing Food and eating ? N  ?Using the Toilet? N  ?In the past six months, have you accidently leaked urine? N  ?Do you have problems with loss of bowel control? N  ?Managing your Medications? N  ?Managing your Finances? N  ?Housekeeping or managing your Housekeeping? N  ? ?Most recent fall risk  assessment: ? ?  06/06/2021  ?  9:35 AM  ?Fall Risk   ?Falls in the past year? 1  ?Number falls in past yr: 1  ?Injury with Fall? 1  ?Risk for fall due to : Impaired balance/gait;Impaired vision  ?Follow up Education provided;Falls prevention discussed;Falls evaluation completed  ? ? Most recent depression screenings: ? ?  06/06/2021  ?  9:32 AM 06/04/2020  ?  8:11 AM  ?PHQ 2/9 Scores  ?PHQ - 2 Score 0 0  ? ?Most recent cognitive screening: ? ?  06/06/2021  ?  9:37 AM  ?6CIT Screen  ?What Year? 0 points  ?What month? 0 points  ?What time? 0 points  ?Count back from 20 0 points  ?Months in reverse 0 points  ?Repeat phrase 0 points  ?Total Score 0 points  ? ?Most recent Audit-C alcohol use screening ? ?  06/06/2021  ?  9:34 AM  ?Alcohol Use Disorder Test (AUDIT)  ?1. How often do you have a drink containing alcohol? 0  ?2. How many drinks containing alcohol do you have on a typical day when you are drinking? 0  ?3. How often do you have six or more drinks on one occasion? 0  ?AUDIT-C Score 0  ? ?A score of 3 or more in women, and 4 or more in men indicates increased risk for alcohol abuse, EXCEPT if all of the points are from question 1  ? ?Vision/Hearing Screen: ?No results found. ? ?Last CBC ?Lab Results  ?Component Value Date  ? WBC 9.4 05/01/2021  ? HGB 13.4 05/01/2021  ? HCT 39.6 05/01/2021  ? MCV 93 05/01/2021  ? MCH 31.5 05/01/2021  ? RDW 13.6 05/01/2021  ? PLT 225 05/01/2021  ? ?Last metabolic panel ?Lab Results  ?Component Value Date  ? GLUCOSE 94 05/01/2021  ? NA 143 05/01/2021  ? K 4.6 05/01/2021  ? CL 102 05/01/2021  ? CO2 25 05/01/2021  ? BUN 17 05/01/2021  ? CREATININE 1.06 (H) 05/01/2021  ? EGFR 58 (L) 05/01/2021  ? CALCIUM 9.5 05/01/2021  ? PROT 6.9 05/01/2021  ? ALBUMIN 4.4 05/01/2021  ? LABGLOB 2.5 05/01/2021  ? AGRATIO 1.8 05/01/2021  ? BILITOT 0.3 05/01/2021  ? ALKPHOS 98 05/01/2021  ? AST 45 (H) 05/01/2021  ? ALT 55 (H) 05/01/2021  ? ?Last lipids ?Lab Results  ?Component Value Date  ? CHOL 107  05/01/2021  ? HDL 38 (L) 05/01/2021  ? LDLCALC 46 05/01/2021  ? TRIG 130 05/01/2021  ? CHOLHDL 2.8 05/01/2021  ? ?Last hemoglobin A1c ?Lab Results  ?Component Value Date  ? HGBA1C 6.0 (H) 05/01/2021  ? ?Last thyroid functions ?Lab Re

## 2021-06-06 NOTE — Telephone Encounter (Signed)
Dr Tobie Poet recommended patient take half of her 50 mg Chlorhalidone - patient reported that she is unable to split the pill due to it crumbling.  25 mg sent in to [facility].  ?

## 2021-06-18 DIAGNOSIS — H524 Presbyopia: Secondary | ICD-10-CM | POA: Diagnosis not present

## 2021-06-26 ENCOUNTER — Other Ambulatory Visit: Payer: Self-pay

## 2021-06-26 ENCOUNTER — Telehealth: Payer: Self-pay

## 2021-06-26 DIAGNOSIS — M797 Fibromyalgia: Secondary | ICD-10-CM

## 2021-06-26 MED ORDER — AMITRIPTYLINE HCL 25 MG PO TABS: ORAL_TABLET | ORAL | 1 refills | Status: DC

## 2021-06-26 MED ORDER — CHLORTHALIDONE 25 MG PO TABS: 25.0000 mg | ORAL_TABLET | Freq: Every day | ORAL | 1 refills | Status: DC

## 2021-06-26 MED ORDER — PANTOPRAZOLE SODIUM 40 MG PO TBEC: 40.0000 mg | DELAYED_RELEASE_TABLET | Freq: Every evening | ORAL | 1 refills | Status: DC

## 2021-06-26 NOTE — Progress Notes (Signed)
Chronic Care Management Pharmacy Assistant   Name: Cheryl Beard  MRN: [id number] DOB: 04-21-1955   Reason for Encounter: Medication Coordination for [facility]    Recent office visits:  06/06/21 Cheryl Brome MD. Orders only. Ordered Chlorthalidone '25mg'$  daily.   06/06/21 Cheryl Beach NP. Medicare Annual Wellness. No med changes.   05/01/21 Cheryl Brome MD. Seen for Heart Disease. Decrease chlorthalidone 50 mg 1/2 daily.   Recent consult visits:  None  Hospital visits:  None  Medications: Outpatient Encounter Medications as of 06/26/2021  Medication Sig Note   Albuterol Sulfate (PROAIR RESPICLICK) 921 (90 Base) MCG/ACT AEPB Inhale 2 puffs into the lungs as needed (for wheezing).    allopurinol (ZYLOPRIM) 300 MG tablet Take 1 tablet (300 mg total) by mouth daily.    amitriptyline (ELAVIL) 25 MG tablet TAKE ONE TABLET BY MOUTH EVERYDAY AT BEDTIME    aspirin EC 81 MG tablet Take 81 mg by mouth daily.    calcium carbonate (TUMS - DOSED IN MG ELEMENTAL CALCIUM) 500 MG chewable tablet Chew 1 tablet by mouth 2 (two) times daily.    chlorthalidone (HYGROTON) 25 MG tablet Take 1 tablet (25 mg total) by mouth daily.    cholecalciferol (VITAMIN D3) 25 MCG (1000 UNIT) tablet Take 1,000 Units by mouth daily.    EDARBI 80 MG TABS TAKE 1 TABLET BY MOUTH ONCE DAILY    metoprolol succinate (TOPROL-XL) 25 MG 24 hr tablet Take 1 tablet (25 mg total) by mouth daily.    Multiple Vitamins-Minerals (WOMENS MULTIVITAMIN PLUS PO) Take 1 tablet by mouth every morning.    Omega-3 Fatty Acids (FISH OIL) 1000 MG CAPS Take 2 capsules by mouth daily. 2 capsules am and 1 capsule pm 08/05/2020: Patient unable to tolerate 2 capsules am and pm due to belching fish taste at night if taking 2. Discussed trying to store fish oil in refrigerator to reduce belching fishy taste.    pantoprazole (PROTONIX) 40 MG tablet TAKE ONE TABLET BY MOUTH EVERY EVENING    rosuvastatin (CRESTOR) 20 MG tablet TAKE ONE TABLET BY MOUTH  EVERYDAY AT BEDTIME    No facility-administered encounter medications on file as of 06/26/2021.    Reviewed chart for medication changes ahead of medication coordination call.  No Consults, or hospital visits since last care coordination call/Pharmacist visit.  No medication changes indicated OR if recent visit, treatment plan here.  BP Readings from Last 3 Encounters:  06/06/21 117/70  05/01/21 132/74  01/23/21 130/68    Lab Results  Component Value Date   HGBA1C 6.0 (H) 05/01/2021     Patient obtains medications through Adherence Packaging  90 Days   Last adherence delivery included: Allopurinol '300mg'$ -breakfast Amitriptyline '25mg'$ -bedtime Chlorthalidone '50mg'$ -breakfast Metoprolol ER Succinate '25mg'$ -1 at breakfast Rosuvastatin '20mg'$ -bedtime    Pantoprazole '40mg'$ -1 evening meal   Patient declined (meds) last delivery  Albuterol Sulfate 108- Does not need, only uses PRN  Edarbi 80 mg- Gets at another pharmacy cheaper  Patient is due for next adherence delivery on: 07/08/21. Called patient and reviewed medications and coordinated delivery.  This delivery to include: Allopurinol '300mg'$ -1 breakfast Amitriptyline '25mg'$ -1 bedtime Chlorthalidone '25mg'$ -1 breakfast Metoprolol ER Succinate '25mg'$ -1 at breakfast Rosuvastatin '20mg'$ -1 bedtime    Pantoprazole '40mg'$ -1 evening meal   Patient declined the following medications  Albuterol Sulfate 108- Does not need, only uses PRN  Edarbi 80 mg- Gets at another pharmacy cheaper  Patient needs refills -Request Sent Chlorthalidone '25mg'$  Amitriptyline '25mg'$  Pantoprazole '40mg'$   Confirmed delivery date of 07/08/21,  advised patient that pharmacy will contact them the morning of delivery.   Elray Mcgregor, [place] Pharmacist Assistant  [phone]

## 2021-08-12 ENCOUNTER — Telehealth: Payer: Self-pay

## 2021-08-12 ENCOUNTER — Other Ambulatory Visit: Payer: Self-pay

## 2021-08-12 DIAGNOSIS — M797 Fibromyalgia: Secondary | ICD-10-CM

## 2021-08-12 MED ORDER — AMITRIPTYLINE HCL 50 MG PO TABS: ORAL_TABLET | ORAL | 0 refills | Status: DC

## 2021-08-12 NOTE — Progress Notes (Addendum)
Chronic Care Management Pharmacy Assistant   Name: KINZY WEYERS  MRN: [id number] DOB: 07/19/1955   Reason for Encounter: General Adherence Call   Recent office visits:  None  Recent consult visits:  None  Hospital visits:  None  Medications: Outpatient Encounter Medications as of 08/12/2021  Medication Sig Note   Albuterol Sulfate (PROAIR RESPICLICK) 353 (90 Base) MCG/ACT AEPB Inhale 2 puffs into the lungs as needed (for wheezing).    allopurinol (ZYLOPRIM) 300 MG tablet Take 1 tablet (300 mg total) by mouth daily.    amitriptyline (ELAVIL) 25 MG tablet Take 1 tablet by mouth everyday at bedtime.    aspirin EC 81 MG tablet Take 81 mg by mouth daily.    calcium carbonate (TUMS - DOSED IN MG ELEMENTAL CALCIUM) 500 MG chewable tablet Chew 1 tablet by mouth 2 (two) times daily.    chlorthalidone (HYGROTON) 25 MG tablet Take 1 tablet (25 mg total) by mouth daily.    cholecalciferol (VITAMIN D3) 25 MCG (1000 UNIT) tablet Take 1,000 Units by mouth daily.    EDARBI 80 MG TABS TAKE 1 TABLET BY MOUTH ONCE DAILY    metoprolol succinate (TOPROL-XL) 25 MG 24 hr tablet Take 1 tablet (25 mg total) by mouth daily.    Multiple Vitamins-Minerals (WOMENS MULTIVITAMIN PLUS PO) Take 1 tablet by mouth every morning.    Omega-3 Fatty Acids (FISH OIL) 1000 MG CAPS Take 2 capsules by mouth daily. 2 capsules am and 1 capsule pm 08/05/2020: Patient unable to tolerate 2 capsules am and pm due to belching fish taste at night if taking 2. Discussed trying to store fish oil in refrigerator to reduce belching fishy taste.    pantoprazole (PROTONIX) 40 MG tablet Take 1 tablet (40 mg total) by mouth every evening.    rosuvastatin (CRESTOR) 20 MG tablet TAKE ONE TABLET BY MOUTH EVERYDAY AT BEDTIME    No facility-administered encounter medications on file as of 08/12/2021.    [facility] for [facility] Review Call   Chart Review:  Have there been any documented new, changed, or  discontinued medications since last visit? No  Has there been any documented recent hospitalizations or ED visits since last visit with Clinical Pharmacist? No Brief Summary (including medication and/or Diagnosis changes):   Adherence Review:  Does the Clinical Pharmacist Assistant have access to adherence rates? Yes Adherence rates for STAR metric medications (List medication(s)/day supply/ last 2 fill dates). Rosuvastatin 07/08/21-04/03/21 90ds Adherence rates for medications indicated for disease state being reviewed (List medication(s)/day supply/ last 2 fill dates). Does the patient have >5 day gap between last estimated fill dates for any of the above medications or other medication gaps? No Reason for medication gaps.   Disease State Questions:  Able to connect with Patient? Yes Did patient have any problems with their health recently? No Note problems and Concerns: Have you had any admissions or emergency room visits or worsening of your condition(s) since last visit? No Details of ED visit, hospital visit and/or worsening condition(s): Have you had any visits with new specialists or providers since your last visit? No Explain: Have you had any new health care problem(s) since your last visit? No New problem(s) reported: Have you run out of any of your medications since you last spoke with clinical pharmacist? No What caused you to run out of your medications? Are there any medications you are not taking as prescribed? No What kept you from taking your medications as prescribed? Are  you having any issues or side effects with your medications? No Note of issues or side effects: Do you have any other health concerns or questions you want to discuss with your Clinical Pharmacist before your next visit? No Note additional concerns and questions from Patient. Are there any health concerns that you feel we can do a better job addressing? No Note Patient's response. Are you having  any problems with any of the following since the last visit: (select all that apply)  None  Details: 12. Any falls since last visit? No  Details: 13. Any increased or uncontrolled pain since last visit? No  Details: 14. Next visit Type: office       Visit with:Dr. Cox        Date:09/04/21         Time:9:00am  15. Additional Details? Yes, pt is greiving her husband that passed away in Jul 09, 2022. She was upset on the phone and we talked for a little bit and everything else seemed well managed but pt is having a hard time sleeping at night and requested an increased on her Amitriptyline so I have sent this request to Dr. Tobie Poet. Pt will be transferred to [facility].   Elray Mcgregor, [place] Pharmacist Assistant  [phone]

## 2021-08-12 NOTE — Telephone Encounter (Addendum)
Patient was instructed to increase elavil to 50 mg at bedtime per Dr. Tobie Poet.    She is going to take 2 of the 25 mg tablets and call us back when she needs a refill.     ----- Message from Eulis Canner sent at 08/12/2021 10:29 AM EDT ----- Regarding: Medication Good morning,  Spoke to pt today and request an increased amitriptyline (ELAVIL) 25 MG tablet  due to it not lasting her throughout the night. She is having a hard time sleeping due to pain. Please advise.   Elray Mcgregor, [place] Pharmacist Assistant  [phone]

## 2021-09-03 NOTE — Progress Notes (Signed)
Subjective:  Patient ID: Cheryl Beard, female    DOB: 18-Mar-1955  Age: 66 y.o. MRN: [id number]  Chief Complaint  Patient presents with   Hyperlipidemia   Hypertension   HPi Hyperlipidemia Pertinent negatives include no chest pain, myalgias or shortness of breath.   Hyperlipidemia:  Patient is currently taking Rosuvastatin 20 mg take 1 tablet daily, OTC fish oil 1,000 mg take 2 capsules twice daily.  Hypertension/CAD: Patient is currently taking Toprol xl 25 mg take 1 tablet daily, Chlorthalidone 25 mg take 1 tablet daily (lowered from 50 mg), Edarbi 80 mg take 1 tablet daily. Bp at home headache been 99-108/47-57. Pulse 64-72.  Prediabetes: Last A1c: 6.0 Diet: healthy.  Exercise: walking.   GERD: Patient is currently taking Pantoprazole 40 mg take 1 tablet daily.  Asthma, mild intermittent. Using albuterol (proair) seasonally.   Gout: allopurinol 300 mg once daily.    Obesity: BMI 46.  FM: Amitriptyline helping with FM. Both legs in knees/lower legs: legs throb. Worsening.  Insomnia: I increased to amitriptyline 50 mg before bed.   Patient is grieving the loss of her husband. Patient talks with her sisters. Prefers no counseling or medications.   Current Outpatient Medications on File Prior to Visit  Medication Sig Dispense Refill   Albuterol Sulfate (PROAIR RESPICLICK) 409 (90 Base) MCG/ACT AEPB Inhale 2 puffs into the lungs as needed (for wheezing). 1 each 2   allopurinol (ZYLOPRIM) 300 MG tablet Take 1 tablet (300 mg total) by mouth daily. 90 tablet 3   aspirin EC 81 MG tablet Take 81 mg by mouth daily.     calcium carbonate (TUMS - DOSED IN MG ELEMENTAL CALCIUM) 500 MG chewable tablet Chew 1 tablet by mouth 2 (two) times daily.     cholecalciferol (VITAMIN D3) 25 MCG (1000 UNIT) tablet Take 1,000 Units by mouth daily.     EDARBI 80 MG TABS TAKE 1 TABLET BY MOUTH ONCE DAILY 90 tablet 3   metoprolol succinate (TOPROL-XL) 25 MG 24 hr tablet Take 1 tablet (25 mg  total) by mouth daily. 90 tablet 1   Multiple Vitamins-Minerals (WOMENS MULTIVITAMIN PLUS PO) Take 1 tablet by mouth every morning.     Omega-3 Fatty Acids (FISH OIL) 1000 MG CAPS Take 2 capsules by mouth daily. 2 capsules am and 1 capsule pm     rosuvastatin (CRESTOR) 20 MG tablet TAKE ONE TABLET BY MOUTH EVERYDAY AT BEDTIME 90 tablet 1   No current facility-administered medications on file prior to visit.   Past Medical History:  Diagnosis Date   Allergy    CAD (coronary artery disease)    Depression    Erosive esophagitis    Erosive gastritis    Gallbladder disease    GERD (gastroesophageal reflux disease)    History of Clostridium difficile infection    History of COVID-19    History of myocardial infarction    Hyperlipidemia    Hypertension    Hypertensive urgency    Idiopathic gout    Myocardial infarction ([place])    Nontoxic multinodular goiter    Other asthma    Vitamin D deficiency    Past Surgical History:  Procedure Laterality Date   ABDOMINAL HYSTERECTOMY     APPENDECTOMY     CHOLECYSTECTOMY     ROTATOR CUFF REPAIR      Family History  Problem Relation Age of Onset   Cancer Mother    Parkinson's disease Mother    Diabetes Mother    Hypertension Mother  Stroke Father    Hyperlipidemia Sister    Hypertension Sister    Alzheimer's disease Maternal Grandmother    Fibromyalgia Sister    Cancer Sister    Cancer Paternal Grandmother        Leukemia   Social History   Socioeconomic History   Marital status: Widowed    Spouse name: Not on file   Number of children: 3   Years of education: Not on file   Highest education level: Not on file  Occupational History   Not on file  Tobacco Use   Smoking status: Former    Types: Cigarettes    Quit date: 2002    Years since quitting: 21.6   Smokeless tobacco: Never  Substance and Sexual Activity   Alcohol use: Never   Drug use: Never   Sexual activity: Not on file  Other Topics Concern   Not on file   Social History Narrative   Not on file   Social Determinants of Health   Financial Resource Strain: Low Risk  (06/06/2021)   Overall Financial Resource Strain (CARDIA)    Difficulty of Paying Living Expenses: Not very hard  Food Insecurity: No Food Insecurity (06/06/2021)   Hunger Vital Sign    Worried About Running Out of Food in the Last Year: Never true    Ran Out of Food in the Last Year: Never true  Transportation Needs: No Transportation Needs (06/06/2021)   PRAPARE - Hydrologist (Medical): No    Lack of Transportation (Non-Medical): No  Physical Activity: Inactive (06/06/2021)   Exercise Vital Sign    Days of Exercise per Week: 0 days    Minutes of Exercise per Session: 0 min  Stress: No Stress Concern Present (06/06/2021)   [place]    Feeling of Stress : Only a little  Social Connections: Moderately Integrated (06/06/2021)   Social Connection and Isolation Panel [NHANES]    Frequency of Communication with Friends and Family: Three times a week    Frequency of Social Gatherings with Friends and Family: More than three times a week    Attends Religious Services: More than 4 times per year    Active Member of [employer] or Organizations: No    Attends Archivist Meetings: Never    Marital Status: Married    Review of Systems  Constitutional:  Negative for appetite change, fatigue and fever.  HENT:  Negative for congestion, ear pain, sinus pressure and sore throat.   Respiratory:  Negative for cough, shortness of breath and wheezing.   Cardiovascular:  Negative for chest pain and palpitations.  Gastrointestinal:  Negative for abdominal pain, constipation, diarrhea, nausea and vomiting.  Genitourinary:  Negative for dysuria and frequency.  Musculoskeletal:  Negative for arthralgias, back pain, joint swelling and myalgias.  Skin:  Negative for rash.  Neurological:  Negative  for dizziness, weakness and headaches.  Psychiatric/Behavioral:  Negative for dysphoric mood. The patient is not nervous/anxious.      Objective:  BP 118/82   Pulse 76   Temp 97.8 F (36.6 C) (Oral)   Ht '5\' 2"'$  (1.575 m)   Wt 256 lb (116.1 kg)   SpO2 97%   BMI 46.82 kg/m      09/04/2021   10:01 AM 09/04/2021    9:01 AM 06/06/2021    9:21 AM  BP/Weight  Systolic BP 956 213 086  Diastolic BP 82 70 70  Wt. (  Lbs)  256 265  BMI  46.82 kg/m2 48.47 kg/m2    Physical Exam Vitals reviewed.  Constitutional:      Appearance: Normal appearance. She is normal weight.  Neck:     Vascular: No carotid bruit.  Cardiovascular:     Rate and Rhythm: Normal rate and regular rhythm.     Heart sounds: Normal heart sounds.  Pulmonary:     Effort: Pulmonary effort is normal. No respiratory distress.     Breath sounds: Normal breath sounds.  Abdominal:     General: Abdomen is flat. Bowel sounds are normal.     Palpations: Abdomen is soft.     Tenderness: There is no abdominal tenderness.  Neurological:     Mental Status: She is alert and oriented to person, place, and time.  Psychiatric:        Mood and Affect: Mood normal.        Behavior: Behavior normal.     Diabetic Foot Exam - Simple   No data filed      Lab Results  Component Value Date   WBC 7.8 09/04/2021   HGB 13.7 09/04/2021   HCT 40.4 09/04/2021   PLT 215 09/04/2021   GLUCOSE 104 (H) 09/04/2021   CHOL 117 09/04/2021   TRIG 163 (H) 09/04/2021   HDL 41 09/04/2021   LDLCALC 49 09/04/2021   ALT 43 (H) 09/04/2021   AST 34 09/04/2021   NA 139 09/04/2021   K 4.3 09/04/2021   CL 99 09/04/2021   CREATININE 1.22 (H) 09/04/2021   BUN 20 09/04/2021   CO2 24 09/04/2021   TSH 4.400 05/17/2020   HGBA1C 5.9 (H) 09/04/2021      Assessment & Plan:   Problem List Items Addressed This Visit       Cardiovascular and Mediastinum   Hypertensive heart disease without heart failure - Primary    Continue Toprol xl 25 mg  take 1 tablet daily, Edarbi 80 mg take 1 tablet daily. Patient to hold chlorthalidone as bp has been low at home. Continue to monitor bp at home and let us know if it increases.  Continue to work on eating a healthy diet and exercise.  Labs drawn today.        Relevant Orders   CBC with Differential (Completed)   Comprehensive metabolic panel (Completed)   Coronary artery disease involving native coronary artery of native heart without angina pectoris    Continue rosuvastatin 20 mg before bed, toprol xl 25 mg daily, and aspirin 81 mg daily.       Relevant Orders   Lipid Panel (Completed)     Respiratory   Uncomplicated asthma    Using albuterol (proair) seasonally.         Digestive   GERD without esophagitis    The current medical regimen is effective;  continue present plan and medications. Continue Pantoprazole 40 mg take 1 tablet daily.        Other   Mixed hyperlipidemia    Well controlled.  No changes to medicines. Continue Rosuvastatin 20 mg take 1 tablet daily, OTC fish oil 1,000 mg take 2 capsules twice daily. Continue to work on eating a healthy diet and exercise.  Labs drawn today.        Relevant Orders   Lipid Panel (Completed)   Fibromyalgia    Increase amitriptyline to 50 mg before bed.       Relevant Medications   amitriptyline (ELAVIL) 50 MG tablet  Prediabetes    Recommend continue to work on eating healthy diet and exercise.       Relevant Orders   Hemoglobin A1c (Completed)  .  Meds ordered this encounter  Medications   amitriptyline (ELAVIL) 50 MG tablet    Sig: Take 1 tablet (50 mg total) by mouth at bedtime.    Dispense:  90 tablet    Refill:  1    Orders Placed This Encounter  Procedures   CBC with Differential   Comprehensive metabolic panel   Lipid Panel   Hemoglobin A1c     Follow-up: Return in about 4 months (around 01/04/2022) for chronic fasting.  An After Visit Summary was printed and given to the  patient.   [name], MD [facility] [phone]

## 2021-09-04 ENCOUNTER — Ambulatory Visit (INDEPENDENT_AMBULATORY_CARE_PROVIDER_SITE_OTHER): Payer: PPO | Admitting: Family Medicine

## 2021-09-04 ENCOUNTER — Encounter: Payer: Self-pay | Admitting: Family Medicine

## 2021-09-04 VITALS — BP 118/82 | HR 76 | Temp 97.8°F | Ht 62.0 in | Wt 256.0 lb

## 2021-09-04 DIAGNOSIS — E782 Mixed hyperlipidemia: Secondary | ICD-10-CM

## 2021-09-04 DIAGNOSIS — K219 Gastro-esophageal reflux disease without esophagitis: Secondary | ICD-10-CM

## 2021-09-04 DIAGNOSIS — R7303 Prediabetes: Secondary | ICD-10-CM

## 2021-09-04 DIAGNOSIS — I119 Hypertensive heart disease without heart failure: Secondary | ICD-10-CM | POA: Diagnosis not present

## 2021-09-04 DIAGNOSIS — J4520 Mild intermittent asthma, uncomplicated: Secondary | ICD-10-CM | POA: Diagnosis not present

## 2021-09-04 DIAGNOSIS — M797 Fibromyalgia: Secondary | ICD-10-CM

## 2021-09-04 DIAGNOSIS — I2510 Atherosclerotic heart disease of native coronary artery without angina pectoris: Secondary | ICD-10-CM

## 2021-09-04 MED ORDER — AMITRIPTYLINE HCL 50 MG PO TABS: 50.0000 mg | ORAL_TABLET | Freq: Every day | ORAL | 1 refills | Status: DC

## 2021-09-04 NOTE — Patient Instructions (Signed)
Discontinue chlorthalidone.  Continue amitriptyline 50 mg once daily at night.

## 2021-09-05 LAB — COMPREHENSIVE METABOLIC PANEL
ALT: 43 IU/L — ABNORMAL HIGH (ref 0–32)
AST: 34 IU/L (ref 0–40)
Albumin/Globulin Ratio: 1.7 (ref 1.2–2.2)
Albumin: 4.4 g/dL (ref 3.9–4.9)
Alkaline Phosphatase: 98 IU/L (ref 44–121)
BUN/Creatinine Ratio: 16 (ref 12–28)
BUN: 20 mg/dL (ref 8–27)
Bilirubin Total: 0.3 mg/dL (ref 0.0–1.2)
CO2: 24 mmol/L (ref 20–29)
Calcium: 9.9 mg/dL (ref 8.7–10.3)
Chloride: 99 mmol/L (ref 96–106)
Creatinine, Ser: 1.22 mg/dL — ABNORMAL HIGH (ref 0.57–1.00)
Globulin, Total: 2.6 g/dL (ref 1.5–4.5)
Glucose: 104 mg/dL — ABNORMAL HIGH (ref 70–99)
Potassium: 4.3 mmol/L (ref 3.5–5.2)
Sodium: 139 mmol/L (ref 134–144)
Total Protein: 7 g/dL (ref 6.0–8.5)
eGFR: 49 mL/min/{1.73_m2} — ABNORMAL LOW (ref >59)

## 2021-09-05 LAB — HEMOGLOBIN A1C
Est. average glucose Bld gHb Est-mCnc: 123 mg/dL
Hgb A1c MFr Bld: 5.9 % — ABNORMAL HIGH (ref 4.8–5.6)

## 2021-09-05 LAB — CBC WITH DIFFERENTIAL/PLATELET
Basophils Absolute: 0.1 10*3/uL (ref 0.0–0.2)
Basos: 1 %
EOS (ABSOLUTE): 0.1 10*3/uL (ref 0.0–0.4)
Eos: 2 %
Hematocrit: 40.4 % (ref 34.0–46.6)
Hemoglobin: 13.7 g/dL (ref 11.1–15.9)
Immature Grans (Abs): 0 10*3/uL (ref 0.0–0.1)
Immature Granulocytes: 0 %
Lymphocytes Absolute: 2.9 10*3/uL (ref 0.7–3.1)
Lymphs: 37 %
MCH: 31.6 pg (ref 26.6–33.0)
MCHC: 33.9 g/dL (ref 31.5–35.7)
MCV: 93 fL (ref 79–97)
Monocytes Absolute: 0.6 10*3/uL (ref 0.1–0.9)
Monocytes: 7 %
Neutrophils Absolute: 4.2 10*3/uL (ref 1.4–7.0)
Neutrophils: 53 %
Platelets: 215 10*3/uL (ref 150–450)
RBC: 4.34 x10E6/uL (ref 3.77–5.28)
RDW: 13.3 % (ref 11.7–15.4)
WBC: 7.8 10*3/uL (ref 3.4–10.8)

## 2021-09-05 LAB — LIPID PANEL
Chol/HDL Ratio: 2.9 ratio (ref 0.0–4.4)
Cholesterol, Total: 117 mg/dL (ref 100–199)
HDL: 41 mg/dL (ref >39)
LDL Chol Calc (NIH): 49 mg/dL (ref 0–99)
Triglycerides: 163 mg/dL — ABNORMAL HIGH (ref 0–149)
VLDL Cholesterol Cal: 27 mg/dL (ref 5–40)

## 2021-09-07 NOTE — Progress Notes (Signed)
Blood count normal.  Liver function improved. Kidney function abnormal, but stable.  Cholesterol: Good. HBA1C: 5.9.  Improved.

## 2021-09-14 DIAGNOSIS — M25572 Pain in left ankle and joints of left foot: Secondary | ICD-10-CM | POA: Diagnosis not present

## 2021-09-14 DIAGNOSIS — M79672 Pain in left foot: Secondary | ICD-10-CM | POA: Diagnosis not present

## 2021-09-14 DIAGNOSIS — S99922A Unspecified injury of left foot, initial encounter: Secondary | ICD-10-CM | POA: Diagnosis not present

## 2021-09-14 DIAGNOSIS — S99912A Unspecified injury of left ankle, initial encounter: Secondary | ICD-10-CM | POA: Diagnosis not present

## 2021-09-14 DIAGNOSIS — M7989 Other specified soft tissue disorders: Secondary | ICD-10-CM | POA: Diagnosis not present

## 2021-09-14 DIAGNOSIS — W19XXXA Unspecified fall, initial encounter: Secondary | ICD-10-CM | POA: Diagnosis not present

## 2021-09-14 NOTE — Assessment & Plan Note (Signed)
Continue Toprol xl 25 mg take 1 tablet daily, Edarbi 80 mg take 1 tablet daily. Patient to hold chlorthalidone as bp has been low at home. Continue to monitor bp at home and let us know if it increases.  Continue to work on eating a healthy diet and exercise.  Labs drawn today.

## 2021-09-14 NOTE — Assessment & Plan Note (Signed)
Continue rosuvastatin 20 mg before bed, toprol xl 25 mg daily, and aspirin 81 mg daily.

## 2021-09-14 NOTE — Assessment & Plan Note (Signed)
Using albuterol (proair) seasonally.

## 2021-09-14 NOTE — Assessment & Plan Note (Signed)
Increase amitriptyline to 50 mg before bed.

## 2021-09-14 NOTE — Assessment & Plan Note (Signed)
The current medical regimen is effective;  continue present plan and medications. Continue Pantoprazole 40 mg take 1 tablet daily.

## 2021-09-14 NOTE — Assessment & Plan Note (Signed)
Recommend continue to work on eating healthy diet and exercise.  

## 2021-09-14 NOTE — Assessment & Plan Note (Signed)
Well controlled.  No changes to medicines. Continue Rosuvastatin 20 mg take 1 tablet daily, OTC fish oil 1,000 mg take 2 capsules twice daily. Continue to work on eating a healthy diet and exercise.  Labs drawn today.

## 2021-09-19 ENCOUNTER — Telehealth: Payer: Self-pay

## 2021-09-19 NOTE — Telephone Encounter (Signed)
Patient called and would like a referral to Orthopedic in [place], she stated she hurt her ankle last Friday and went to urgent care Sunday they did a x-ray and said everything look fine, but she need to see Orthopedic. Please advise.

## 2021-09-22 ENCOUNTER — Other Ambulatory Visit: Payer: Self-pay | Admitting: Family Medicine

## 2021-09-22 DIAGNOSIS — M25579 Pain in unspecified ankle and joints of unspecified foot: Secondary | ICD-10-CM

## 2021-09-22 NOTE — Telephone Encounter (Signed)
Sent. Dr. Tykeisha Peer  

## 2021-09-23 NOTE — Telephone Encounter (Signed)
Patient was informed.

## 2021-09-24 ENCOUNTER — Other Ambulatory Visit: Payer: Self-pay | Admitting: Family Medicine

## 2021-09-24 ENCOUNTER — Telehealth: Payer: Self-pay

## 2021-09-24 NOTE — Progress Notes (Signed)
    Chronic Care Management Pharmacy Assistant   Name: Cheryl Beard  MRN: [id number] DOB: [birth year]   Reason for Encounter: Medication Coordination for Upstream    Recent office visits:  09/04/21 [name] MD. Seen for routine visit. Increased Amitriptyline to '50mg'$  at bedtime. D/C Chlorthalidone '25mg'$  daily and Pantoprazole Sodium '40mg'$ .   Recent consult visits:  None  Hospital visits:  None  Medications: Outpatient Encounter Medications as of 09/24/2021  Medication Sig Note   Albuterol Sulfate (PROAIR RESPICLICK) 701 (90 Base) MCG/ACT AEPB Inhale 2 puffs into the lungs as needed (for wheezing).    allopurinol (ZYLOPRIM) 300 MG tablet Take 1 tablet (300 mg total) by mouth daily.    amitriptyline (ELAVIL) 50 MG tablet Take 1 tablet (50 mg total) by mouth at bedtime.    aspirin EC 81 MG tablet Take 81 mg by mouth daily.    calcium carbonate (TUMS - DOSED IN MG ELEMENTAL CALCIUM) 500 MG chewable tablet Chew 1 tablet by mouth 2 (two) times daily.    cholecalciferol (VITAMIN D3) 25 MCG (1000 UNIT) tablet Take 1,000 Units by mouth daily.    EDARBI 80 MG TABS TAKE 1 TABLET BY MOUTH ONCE DAILY    metoprolol succinate (TOPROL-XL) 25 MG 24 hr tablet Take 1 tablet (25 mg total) by mouth daily.    Multiple Vitamins-Minerals (WOMENS MULTIVITAMIN PLUS PO) Take 1 tablet by mouth every morning.    Omega-3 Fatty Acids (FISH OIL) 1000 MG CAPS Take 2 capsules by mouth daily. 2 capsules am and 1 capsule pm 08/05/2020: Patient unable to tolerate 2 capsules am and pm due to belching fish taste at night if taking 2. Discussed trying to store fish oil in refrigerator to reduce belching fishy taste.    rosuvastatin (CRESTOR) 20 MG tablet TAKE ONE TABLET BY MOUTH EVERYDAY AT BEDTIME    No facility-administered encounter medications on file as of 09/24/2021.    Reviewed chart for medication changes ahead of medication coordination call.  No Consults, or hospital visits since last care coordination  call/Pharmacist visit.    BP Readings from Last 3 Encounters:  09/04/21 118/82  06/06/21 117/70  05/01/21 132/74    Lab Results  Component Value Date   HGBA1C 5.9 (H) 09/04/2021     Patient obtains medications through Adherence Packaging  90 Days   Last adherence delivery included:  Allopurinol '300mg'$ -1 breakfast Amitriptyline '25mg'$ -1 bedtime Chlorthalidone '25mg'$ -1 breakfast Metoprolol ER Succinate '25mg'$ -1 at breakfast Rosuvastatin '20mg'$ -1 bedtime    Pantoprazole '40mg'$ -1 evening meal    Patient declined (meds) last delivery Albuterol Sulfate 108- Does not need, only uses PRN  Edarbi 80 mg- Gets at another pharmacy cheaper  Patient is due for next adherence delivery on: 10/06/21. Called patient and reviewed medications and coordinated delivery.  This delivery to include: Allopurinol '300mg'$ -1 breakfast Amitriptyline '50mg'$ -1 bedtime Metoprolol ER Succinate '25mg'$ -1 at breakfast Rosuvastatin '20mg'$ -1 bedtime     Patient declined the following medications  Albuterol Sulfate 108- Does not need, only uses PRN  Edarbi 80 mg- Gets at another pharmacy cheaper Chlorthalidone '25mg'$ -This has been D/C by PCP Pantoprazole '40mg'$ -This has been D/C by PCP  Patient needs refills  None  Confirmed delivery date of 10/06/21, advised patient that pharmacy will contact them the morning of delivery.   Elray Mcgregor, [place] Pharmacist Assistant  [phone]

## 2021-09-25 NOTE — Telephone Encounter (Signed)
Compliant on meds 

## 2021-09-26 ENCOUNTER — Ambulatory Visit: Payer: PPO | Admitting: Family Medicine

## 2021-10-03 DIAGNOSIS — T148XXA Other injury of unspecified body region, initial encounter: Secondary | ICD-10-CM | POA: Diagnosis not present

## 2021-10-03 DIAGNOSIS — M25472 Effusion, left ankle: Secondary | ICD-10-CM | POA: Diagnosis not present

## 2021-10-03 DIAGNOSIS — S99912A Unspecified injury of left ankle, initial encounter: Secondary | ICD-10-CM | POA: Diagnosis not present

## 2021-10-03 DIAGNOSIS — M25572 Pain in left ankle and joints of left foot: Secondary | ICD-10-CM | POA: Diagnosis not present

## 2021-10-09 DIAGNOSIS — M25572 Pain in left ankle and joints of left foot: Secondary | ICD-10-CM | POA: Diagnosis not present

## 2021-10-09 DIAGNOSIS — S82832A Other fracture of upper and lower end of left fibula, initial encounter for closed fracture: Secondary | ICD-10-CM | POA: Diagnosis not present

## 2021-10-09 DIAGNOSIS — M7989 Other specified soft tissue disorders: Secondary | ICD-10-CM | POA: Diagnosis not present

## 2021-10-13 DIAGNOSIS — S82402A Unspecified fracture of shaft of left fibula, initial encounter for closed fracture: Secondary | ICD-10-CM | POA: Diagnosis not present

## 2021-10-13 DIAGNOSIS — M25572 Pain in left ankle and joints of left foot: Secondary | ICD-10-CM | POA: Diagnosis not present

## 2021-10-13 DIAGNOSIS — S9032XA Contusion of left foot, initial encounter: Secondary | ICD-10-CM | POA: Diagnosis not present

## 2021-10-26 HISTORY — PX: CATARACT EXTRACTION: SUR2

## 2021-11-11 DIAGNOSIS — H25812 Combined forms of age-related cataract, left eye: Secondary | ICD-10-CM | POA: Diagnosis not present

## 2021-11-11 DIAGNOSIS — H25813 Combined forms of age-related cataract, bilateral: Secondary | ICD-10-CM | POA: Diagnosis not present

## 2021-11-11 DIAGNOSIS — Z01818 Encounter for other preprocedural examination: Secondary | ICD-10-CM | POA: Diagnosis not present

## 2021-11-12 DIAGNOSIS — S82839D Other fracture of upper and lower end of unspecified fibula, subsequent encounter for closed fracture with routine healing: Secondary | ICD-10-CM | POA: Diagnosis not present

## 2021-11-12 DIAGNOSIS — S9032XD Contusion of left foot, subsequent encounter: Secondary | ICD-10-CM | POA: Diagnosis not present

## 2021-11-12 DIAGNOSIS — M25572 Pain in left ankle and joints of left foot: Secondary | ICD-10-CM | POA: Diagnosis not present

## 2021-11-25 DIAGNOSIS — H25812 Combined forms of age-related cataract, left eye: Secondary | ICD-10-CM | POA: Diagnosis not present

## 2021-11-25 DIAGNOSIS — Z7982 Long term (current) use of aspirin: Secondary | ICD-10-CM | POA: Diagnosis not present

## 2021-11-25 DIAGNOSIS — H259 Unspecified age-related cataract: Secondary | ICD-10-CM | POA: Diagnosis not present

## 2021-11-25 DIAGNOSIS — Z79899 Other long term (current) drug therapy: Secondary | ICD-10-CM | POA: Diagnosis not present

## 2021-11-25 DIAGNOSIS — I10 Essential (primary) hypertension: Secondary | ICD-10-CM | POA: Diagnosis not present

## 2021-12-23 ENCOUNTER — Telehealth: Payer: Self-pay

## 2021-12-23 DIAGNOSIS — J45909 Unspecified asthma, uncomplicated: Secondary | ICD-10-CM | POA: Diagnosis not present

## 2021-12-23 DIAGNOSIS — Z79899 Other long term (current) drug therapy: Secondary | ICD-10-CM | POA: Diagnosis not present

## 2021-12-23 DIAGNOSIS — H259 Unspecified age-related cataract: Secondary | ICD-10-CM | POA: Diagnosis not present

## 2021-12-23 DIAGNOSIS — H25811 Combined forms of age-related cataract, right eye: Secondary | ICD-10-CM | POA: Diagnosis not present

## 2021-12-23 DIAGNOSIS — I10 Essential (primary) hypertension: Secondary | ICD-10-CM | POA: Diagnosis not present

## 2021-12-23 NOTE — Progress Notes (Unsigned)
    Chronic Care Management Pharmacy Assistant   Name: Cheryl Beard  MRN: [id number] DOB: 07/17/1955   Reason for Encounter: Medication Coordination for [facility]stream    Recent office visits:  None  Recent consult visits:  None  Hospital visits:  None  Medications: Outpatient Encounter Medications as of 12/23/2021  Medication Sig Note   Albuterol Sulfate (PROAIR RESPICLICK) 568 (90 Base) MCG/ACT AEPB Inhale 2 puffs into the lungs as needed (for wheezing).    allopurinol (ZYLOPRIM) 300 MG tablet Take 1 tablet (300 mg total) by mouth daily.    amitriptyline (ELAVIL) 50 MG tablet Take 1 tablet (50 mg total) by mouth at bedtime.    aspirin EC 81 MG tablet Take 81 mg by mouth daily.    calcium carbonate (TUMS - DOSED IN MG ELEMENTAL CALCIUM) 500 MG chewable tablet Chew 1 tablet by mouth 2 (two) times daily.    cholecalciferol (VITAMIN D3) 25 MCG (1000 UNIT) tablet Take 1,000 Units by mouth daily.    EDARBI 80 MG TABS TAKE 1 TABLET BY MOUTH ONCE DAILY    metoprolol succinate (TOPROL-XL) 25 MG 24 hr tablet Take 1 tablet (25 mg total) by mouth daily.    Multiple Vitamins-Minerals (WOMENS MULTIVITAMIN PLUS PO) Take 1 tablet by mouth every morning.    Omega-3 Fatty Acids (FISH OIL) 1000 MG CAPS Take 2 capsules by mouth daily. 2 capsules am and 1 capsule pm 08/05/2020: Patient unable to tolerate 2 capsules am and pm due to belching fish taste at night if taking 2. Discussed trying to store fish oil in refrigerator to reduce belching fishy taste.    rosuvastatin (CRESTOR) 20 MG tablet TAKE ONE TABLET BY MOUTH EVERYDAY AT BEDTIME    No facility-administered encounter medications on file as of 12/23/2021.    Reviewed chart for medication changes ahead of medication coordination call.  No OVs, Consults, or hospital visits since last care coordination call/Pharmacist visit.   No medication changes indicated OR if recent visit, treatment plan here.  BP Readings from Last 3 Encounters:   09/04/21 118/82  06/06/21 117/70  05/01/21 132/74    Lab Results  Component Value Date   HGBA1C 5.9 (H) 09/04/2021     Patient obtains medications through Adherence Packaging  90 Days   Last adherence delivery included:  Allopurinol '300mg'$ -1 breakfast Amitriptyline '50mg'$ -1 bedtime Metoprolol ER Succinate '25mg'$ -1 at breakfast Rosuvastatin '20mg'$ -1 bedtime     Patient declined (meds) last month  Albuterol Sulfate 108- Does not need, only uses PRN  Edarbi 80 mg- Gets at another pharmacy cheaper Chlorthalidone '25mg'$ -This has been D/C by PCP Pantoprazole '40mg'$ -This has been D/C by PCP  Patient is due for next adherence delivery on: 01/02/22. Called patient and reviewed medications and coordinated delivery.  This delivery to include: Allopurinol '300mg'$ -1 breakfast Amitriptyline '50mg'$ -1 bedtime Metoprolol ER Succinate '25mg'$ -1 at breakfast Rosuvastatin '20mg'$ -1 bedtime     Patient declined the following medications  Albuterol Sulfate 108- Does not need, only uses PRN  Edarbi 80 mg- Gets at another pharmacy cheaper  Patient needs refills  None  Confirmed delivery date of 01/02/22, advised patient that pharmacy will contact them the morning of delivery.   Elray Mcgregor, [place] Pharmacist Assistant  [phone]

## 2021-12-24 ENCOUNTER — Ambulatory Visit
Admission: RE | Admit: 2021-12-24 | Discharge: 2021-12-24 | Disposition: A | Discharge status: Home / Self Care | Payer: PPO | Source: Ambulatory Visit | Attending: Family Medicine | Admitting: Family Medicine

## 2021-12-24 DIAGNOSIS — Z1231 Encounter for screening mammogram for malignant neoplasm of breast: Secondary | ICD-10-CM

## 2022-01-09 ENCOUNTER — Ambulatory Visit (INDEPENDENT_AMBULATORY_CARE_PROVIDER_SITE_OTHER): Payer: PPO | Admitting: Family Medicine

## 2022-01-09 VITALS — BP 120/64 | HR 72 | Temp 97.0°F | Resp 18 | Ht 62.0 in | Wt 255.0 lb

## 2022-01-09 DIAGNOSIS — I25119 Atherosclerotic heart disease of native coronary artery with unspecified angina pectoris: Secondary | ICD-10-CM

## 2022-01-09 DIAGNOSIS — R0789 Other chest pain: Secondary | ICD-10-CM

## 2022-01-09 DIAGNOSIS — M797 Fibromyalgia: Secondary | ICD-10-CM

## 2022-01-09 DIAGNOSIS — R782 Finding of cocaine in blood: Secondary | ICD-10-CM | POA: Diagnosis not present

## 2022-01-09 DIAGNOSIS — R7301 Impaired fasting glucose: Secondary | ICD-10-CM | POA: Diagnosis not present

## 2022-01-09 DIAGNOSIS — I119 Hypertensive heart disease without heart failure: Secondary | ICD-10-CM

## 2022-01-09 DIAGNOSIS — R079 Chest pain, unspecified: Secondary | ICD-10-CM | POA: Insufficient documentation

## 2022-01-09 DIAGNOSIS — Z6841 Body Mass Index (BMI) 40.0 and over, adult: Secondary | ICD-10-CM | POA: Diagnosis not present

## 2022-01-09 DIAGNOSIS — I10 Essential (primary) hypertension: Secondary | ICD-10-CM | POA: Diagnosis not present

## 2022-01-09 DIAGNOSIS — R7303 Prediabetes: Secondary | ICD-10-CM

## 2022-01-09 DIAGNOSIS — E785 Hyperlipidemia, unspecified: Secondary | ICD-10-CM

## 2022-01-09 LAB — COMPREHENSIVE METABOLIC PANEL
Albumin: 4 (ref 3.5–5.0)
Calcium: 9.4 (ref 8.7–10.7)
Globulin: 7
eGFR: >60

## 2022-01-09 LAB — BASIC METABOLIC PANEL
BUN: 17 (ref 4–21)
CO2: 28 — AB (ref 13–22)
Chloride: 103 (ref 99–108)
Creatinine: 0.9 (ref 0.5–1.1)
Glucose: 106
Potassium: 4.4 mEq/L (ref 3.5–5.1)
Sodium: 140 (ref 137–147)

## 2022-01-09 LAB — LIPID PANEL
Cholesterol: 120 (ref 0–200)
HDL: 42 (ref 35–70)
LDL Cholesterol: 29
LDl/HDL Ratio: 2.9
Triglycerides: 144 (ref 40–160)

## 2022-01-09 LAB — CBC AND DIFFERENTIAL
HCT: 39 (ref 36–46)
Hemoglobin: 13.3 (ref 12.0–16.0)
Platelets: 180 10*3/uL (ref 150–400)
WBC: 7.6

## 2022-01-09 LAB — HEMOGLOBIN A1C: Hemoglobin A1C: 5.6

## 2022-01-09 MED ORDER — NITROGLYCERIN 0.4 MG SL SUBL: 0.4000 mg | SUBLINGUAL_TABLET | SUBLINGUAL | 3 refills | Status: DC | PRN

## 2022-01-09 NOTE — Progress Notes (Signed)
Subjective:  Patient ID: Cheryl Beard, female    DOB: 1955-06-14  Age: 66 y.o. MRN: [id number]  Chief Complaint  Patient presents with   Hyperlipidemia   Hypertensive heart disease with heart failure    HPI CORONARY ARTERY DISEASE: Patient had chest pain (more in thoracic back on the left side) that awoke her from sleep. Took one ntg and resolved. Patient has history of heart attack when cholesterol plaque traveled in heart, but resolved on it's own. LAD 20% stenosed sinc 2010  PreDiabetes:  Eating healthy Most recent A1C: 5.9.  Hyperlipidemia: Current medications: rosuvastastin 20 mg once before bed and fish oil 1000 mg qam   Hypertensive heart disease: Complications: CAD Current medications: edarbi 80 mg daily and toprol xl 25 daily. Has NTG.   Current Outpatient Medications on File Prior to Visit  Medication Sig Dispense Refill   allopurinol (ZYLOPRIM) 300 MG tablet Take 1 tablet (300 mg total) by mouth daily. 90 tablet 3   aspirin EC 81 MG tablet Take 81 mg by mouth daily.     calcium carbonate (TUMS - DOSED IN MG ELEMENTAL CALCIUM) 500 MG chewable tablet Chew 1 tablet by mouth 2 (two) times daily.     cholecalciferol (VITAMIN D3) 25 MCG (1000 UNIT) tablet Take 1,000 Units by mouth daily.     EDARBI 80 MG TABS TAKE 1 TABLET BY MOUTH ONCE DAILY 90 tablet 2   metoprolol succinate (TOPROL-XL) 25 MG 24 hr tablet Take 1 tablet (25 mg total) by mouth daily. 90 tablet 1   Multiple Vitamins-Minerals (WOMENS MULTIVITAMIN PLUS PO) Take 1 tablet by mouth every morning.     Omega-3 Fatty Acids (FISH OIL) 1000 MG CAPS Take 2 capsules by mouth daily. 2 capsules am and 1 capsule pm     rosuvastatin (CRESTOR) 20 MG tablet TAKE ONE TABLET BY MOUTH EVERYDAY AT BEDTIME 90 tablet 1   No current facility-administered medications on file prior to visit.   Past Medical History:  Diagnosis Date   Allergy    CAD (coronary artery disease)    Depression    Erosive esophagitis    Erosive  gastritis    Gallbladder disease    GERD (gastroesophageal reflux disease)    History of Clostridium difficile infection    History of COVID-19    History of myocardial infarction    Hyperlipidemia    Hypertension    Hypertensive urgency    Idiopathic gout    Myocardial infarction ([place])    Nontoxic multinodular goiter    Other asthma    Vitamin D deficiency    Past Surgical History:  Procedure Laterality Date   ABDOMINAL HYSTERECTOMY     APPENDECTOMY     CHOLECYSTECTOMY     ROTATOR CUFF REPAIR      Family History  Problem Relation Age of Onset   Cancer Mother    Parkinson's disease Mother    Diabetes Mother    Hypertension Mother    Stroke Father    Hyperlipidemia Sister    Hypertension Sister    Fibromyalgia Sister    Cancer Sister    Alzheimer's disease Maternal Grandmother    Cancer Paternal Grandmother        Leukemia   Breast cancer Neg Hx    Social History   Socioeconomic History   Marital status: Widowed    Spouse name: Not on file   Number of children: 3   Years of education: Not on file   Highest education level:  Not on file  Occupational History   Not on file  Tobacco Use   Smoking status: Former    Types: Cigarettes    Quit date: 2002    Years since quitting: 22.0   Smokeless tobacco: Never  Substance and Sexual Activity   Alcohol use: Never   Drug use: Never   Sexual activity: Not on file  Other Topics Concern   Not on file  Social History Narrative   Not on file   Social Determinants of Health   Financial Resource Strain: Low Risk  (06/06/2021)   Overall Financial Resource Strain (CARDIA)    Difficulty of Paying Living Expenses: Not very hard  Food Insecurity: No Food Insecurity (06/06/2021)   Hunger Vital Sign    Worried About Running Out of Food in the Last Year: Never true    Ran Out of Food in the Last Year: Never true  Transportation Needs: No Transportation Needs (06/06/2021)   PRAPARE - Hydrologist  (Medical): No    Lack of Transportation (Non-Medical): No  Physical Activity: Inactive (06/06/2021)   Exercise Vital Sign    Days of Exercise per Week: 0 days    Minutes of Exercise per Session: 0 min  Stress: No Stress Concern Present (06/06/2021)   [place]    Feeling of Stress : Only a little  Social Connections: Moderately Integrated (06/06/2021)   Social Connection and Isolation Panel [NHANES]    Frequency of Communication with Friends and Family: Three times a week    Frequency of Social Gatherings with Friends and Family: More than three times a week    Attends Religious Services: More than 4 times per year    Active Member of [employer] or Organizations: No    Attends Archivist Meetings: Never    Marital Status: Married    Review of Systems  Constitutional:  Negative for chills, fatigue and fever.  HENT:  Negative for congestion, rhinorrhea and sore throat.   Respiratory:  Negative for cough and shortness of breath.        Slight pressure in chest   Cardiovascular:  Negative for chest pain.  Gastrointestinal:  Negative for abdominal pain, constipation, diarrhea, nausea and vomiting.  Genitourinary:  Negative for dysuria and urgency.  Musculoskeletal:  Positive for arthralgias (left shoulder pain) and back pain. Negative for myalgias.  Neurological:  Negative for dizziness, weakness, light-headedness and headaches.  Psychiatric/Behavioral:  Negative for dysphoric mood. The patient is not nervous/anxious.      Objective:  BP 120/64   Pulse 72   Temp (!) 97 F (36.1 C)   Resp 18   Ht '5\' 2"'$  (1.575 m)   Wt 255 lb (115.7 kg)   BMI 46.64 kg/m      01/09/2022    8:55 AM 09/04/2021   10:01 AM 09/04/2021    9:01 AM  BP/Weight  Systolic BP 850 277 412  Diastolic BP 64 82 70  Wt. (Lbs) 255  256  BMI 46.64 kg/m2  46.82 kg/m2    Physical Exam Vitals reviewed.  Constitutional:      Appearance:  Normal appearance. She is obese.  Neck:     Vascular: No carotid bruit.  Cardiovascular:     Rate and Rhythm: Normal rate and regular rhythm.     Heart sounds: Normal heart sounds.  Pulmonary:     Effort: Pulmonary effort is normal. No respiratory distress.  Breath sounds: Normal breath sounds.  Abdominal:     General: Abdomen is flat. Bowel sounds are normal.     Palpations: Abdomen is soft.     Tenderness: There is no abdominal tenderness.  Neurological:     Mental Status: She is alert and oriented to person, place, and time.  Psychiatric:        Mood and Affect: Mood normal.        Behavior: Behavior normal.     Diabetic Foot Exam - Simple   No data filed      Lab Results  Component Value Date   WBC 7.6 01/09/2022   HGB 13.3 01/09/2022   HCT 39 01/09/2022   PLT 180 01/09/2022   GLUCOSE 104 (H) 09/04/2021   CHOL 120 01/09/2022   TRIG 144 01/09/2022   HDL 42 01/09/2022   LDLCALC 29 01/09/2022   ALT 43 (H) 09/04/2021   AST 34 09/04/2021   NA 140 01/09/2022   K 4.4 01/09/2022   CL 103 01/09/2022   CREATININE 0.9 01/09/2022   BUN 17 01/09/2022   CO2 28 (A) 01/09/2022   TSH 4.400 05/17/2020   HGBA1C 5.6 01/09/2022      Assessment & Plan:   Problem List Items Addressed This Visit       Cardiovascular and Mediastinum   Hypertensive heart disease without heart failure    The current medical regimen is effective;  continue present plan and medications.  Continue edarbi 80 mg daily and toprol xl 25 daily. Has NTG.       Relevant Medications   nitroGLYCERIN (NITROSTAT) 0.4 MG SL tablet   Atherosclerotic heart disease of native coronary artery with angina pectoris (HCC)    Prescription: NTG.       Relevant Medications   nitroGLYCERIN (NITROSTAT) 0.4 MG SL tablet     Other   Fibromyalgia    stable      Dyslipidemia    Well controlled.  No changes to medicines. Continue rosuvastastin 20 mg once before bed and fish oil 1000 mg qam  Continue to  work on eating a healthy diet and exercise.  Labs drawn today.        BMI 45.0-49.9, adult ([place])    Recommend continue to work on eating healthy diet and exercise.       Prediabetes    Recommend continue to work on eating healthy diet and exercise.       Chest pain - Primary    TTS:VXBL Sent for troponin I: negative.  Patient to call Cardiology.  NTG prescription given.      Relevant Orders   EKG 12-Lead  .  Meds ordered this encounter  Medications   nitroGLYCERIN (NITROSTAT) 0.4 MG SL tablet    Sig: Place 1 tablet (0.4 mg total) under the tongue every 5 (five) minutes as needed for chest pain.    Dispense:  10 tablet    Refill:  3    Orders Placed This Encounter  Procedures   CBC and differential   Basic metabolic panel   Comprehensive metabolic panel   Lipid panel   Hemoglobin A1c   EKG 12-Lead     Follow-up: Return in about 3 months (around 04/10/2022) for chronic fasting.  An After Visit Summary was printed and given to the patient.  [name], MD [facility] [phone]

## 2022-01-21 NOTE — Assessment & Plan Note (Signed)
Prescription: NTG.

## 2022-01-21 NOTE — Assessment & Plan Note (Addendum)
The current medical regimen is effective;  continue present plan and medications.  Continue edarbi 80 mg daily and toprol xl 25 daily. Has NTG.

## 2022-01-21 NOTE — Assessment & Plan Note (Addendum)
RAQ:TMAU Sent for troponin I: negative.  Patient to call Cardiology.  NTG prescription given.

## 2022-01-24 ENCOUNTER — Encounter: Payer: Self-pay | Admitting: Family Medicine

## 2022-01-27 NOTE — Assessment & Plan Note (Signed)
stable °

## 2022-01-27 NOTE — Assessment & Plan Note (Signed)
Recommend continue to work on eating healthy diet and exercise.  

## 2022-01-27 NOTE — Assessment & Plan Note (Signed)
>>  ASSESSMENT AND PLAN FOR DYSLIPIDEMIA WRITTEN ON 01/27/2022  2:13 PM BY COX, KIRSTEN, MD  Well controlled.  No changes to medicines. Continue rosuvastastin 20 mg once before bed and fish oil 1000 mg qam  Continue to work on eating a healthy diet and exercise.  Labs drawn today.

## 2022-01-27 NOTE — Assessment & Plan Note (Signed)
Well controlled.  No changes to medicines. Continue rosuvastastin 20 mg once before bed and fish oil 1000 mg qam  Continue to work on eating a healthy diet and exercise.  Labs drawn today.

## 2022-02-19 ENCOUNTER — Ambulatory Visit (INDEPENDENT_AMBULATORY_CARE_PROVIDER_SITE_OTHER): Payer: PPO | Admitting: Nurse Practitioner

## 2022-02-19 ENCOUNTER — Encounter: Payer: Self-pay | Admitting: Nurse Practitioner

## 2022-02-19 VITALS — BP 128/82 | HR 71 | Temp 97.1°F | Ht 62.0 in | Wt 253.0 lb

## 2022-02-19 DIAGNOSIS — N3946 Mixed incontinence: Secondary | ICD-10-CM

## 2022-02-19 DIAGNOSIS — R351 Nocturia: Secondary | ICD-10-CM

## 2022-02-19 DIAGNOSIS — N3281 Overactive bladder: Secondary | ICD-10-CM

## 2022-02-19 DIAGNOSIS — Z23 Encounter for immunization: Secondary | ICD-10-CM

## 2022-02-19 LAB — POCT URINALYSIS DIP (CLINITEK)
Bilirubin, UA: NEGATIVE
Blood, UA: NEGATIVE
Glucose, UA: NEGATIVE mg/dL
Ketones, POC UA: NEGATIVE mg/dL
Leukocytes, UA: NEGATIVE
Nitrite, UA: NEGATIVE
POC PROTEIN,UA: NEGATIVE
Spec Grav, UA: 1.01 (ref 1.010–1.025)
Urobilinogen, UA: 0.2 E.U./dL
pH, UA: 6 (ref 5.0–8.0)

## 2022-02-19 MED ORDER — GEMTESA 75 MG PO TABS: 75.0000 mg | ORAL_TABLET | Freq: Every day | ORAL | 1 refills | Status: DC

## 2022-02-19 MED ORDER — GEMTESA 75 MG PO TABS: 75.0000 mg | ORAL_TABLET | Freq: Every day | ORAL | 0 refills | Status: DC

## 2022-02-19 NOTE — Progress Notes (Signed)
Acute Office Visit  Subjective:    Patient ID: Cheryl Beard, female    DOB: 08-Aug-1955, 67 y.o.   MRN: [id number]  Chief Complaint  Patient presents with   Urinary Tract Infection    HPI: Patient is in today for Urinary symptoms  She reports chronic dysuria, urinary frequency, and nocturia. States she has urine leakage, wears incontinence pads. She denies fever, chills, or flank pain. The current episode started  1 months ago and is staying constant. Patient states symptoms are moderate in intensity, occurring constantly. She  has not been recently treated for similar symptoms.  Cheryl Beard reports she had three children, underwent hysterectomy several years ago. States she feels that her bladder may have "fallen", states she inserted fingers in her vagina and can "feel something there".She tells me she saw urology several years ago and described what appears a hydrodistension of bladder procedure. Denies previous history of interstitial cystitis, kidney stones, known rectocele or cystocele.       Past Medical History:  Diagnosis Date   Allergy    CAD (coronary artery disease)    Depression    Erosive esophagitis    Erosive gastritis    Gallbladder disease    GERD (gastroesophageal reflux disease)    History of Clostridium difficile infection    History of COVID-19    History of myocardial infarction    Hyperlipidemia    Hypertension    Hypertensive urgency    Idiopathic gout    Myocardial infarction ([place])    Nontoxic multinodular goiter    Other asthma    Vitamin D deficiency     Past Surgical History:  Procedure Laterality Date   ABDOMINAL HYSTERECTOMY     APPENDECTOMY     CHOLECYSTECTOMY     ROTATOR CUFF REPAIR      Family History  Problem Relation Age of Onset   Cancer Mother    Parkinson's disease Mother    Diabetes Mother    Hypertension Mother    Stroke Father    Hyperlipidemia Sister    Hypertension Sister    Fibromyalgia Sister    Cancer Sister     Alzheimer's disease Maternal Grandmother    Cancer Paternal Grandmother        Leukemia   Breast cancer Neg Hx     Social History   Socioeconomic History   Marital status: Widowed    Spouse name: Not on file   Number of children: 3   Years of education: Not on file   Highest education level: Not on file  Occupational History   Not on file  Tobacco Use   Smoking status: Former    Types: Cigarettes    Quit date: 2002    Years since quitting: 22.0   Smokeless tobacco: Never  Substance and Sexual Activity   Alcohol use: Never   Drug use: Never   Sexual activity: Not on file  Other Topics Concern   Not on file  Social History Narrative   Not on file   Social Determinants of Health   Financial Resource Strain: Low Risk  (06/06/2021)   Overall Financial Resource Strain (CARDIA)    Difficulty of Paying Living Expenses: Not very hard  Food Insecurity: No Food Insecurity (06/06/2021)   Hunger Vital Sign    Worried About Running Out of Food in the Last Year: Never true    Ran Out of Food in the Last Year: Never true  Transportation Needs: No Transportation Needs (06/06/2021)  PRAPARE - Hydrologist (Medical): No    Lack of Transportation (Non-Medical): No  Physical Activity: Inactive (06/06/2021)   Exercise Vital Sign    Days of Exercise per Week: 0 days    Minutes of Exercise per Session: 0 min  Stress: No Stress Concern Present (06/06/2021)   [place]    Feeling of Stress : Only a little  Social Connections: Moderately Integrated (06/06/2021)   Social Connection and Isolation Panel [NHANES]    Frequency of Communication with Friends and Family: Three times a week    Frequency of Social Gatherings with Friends and Family: More than three times a week    Attends Religious Services: More than 4 times per year    Active Member of [employer] or Organizations: No    Attends Theatre manager Meetings: Never    Marital Status: Married  Human resources officer Violence: Not At Risk (06/06/2021)   Humiliation, Afraid, Rape, and Kick questionnaire    Fear of Current or Ex-Partner: No    Emotionally Abused: No    Physically Abused: No    Sexually Abused: No    Outpatient Medications Prior to Visit  Medication Sig Dispense Refill   allopurinol (ZYLOPRIM) 300 MG tablet Take 1 tablet (300 mg total) by mouth daily. 90 tablet 3   aspirin EC 81 MG tablet Take 81 mg by mouth daily.     cholecalciferol (VITAMIN D3) 25 MCG (1000 UNIT) tablet Take 1,000 Units by mouth daily.     EDARBI 80 MG TABS TAKE 1 TABLET BY MOUTH ONCE DAILY 90 tablet 2   metoprolol succinate (TOPROL-XL) 25 MG 24 hr tablet Take 1 tablet (25 mg total) by mouth daily. 90 tablet 1   Multiple Vitamins-Minerals (WOMENS MULTIVITAMIN PLUS PO) Take 1 tablet by mouth every morning.     nitroGLYCERIN (NITROSTAT) 0.4 MG SL tablet Place 1 tablet (0.4 mg total) under the tongue every 5 (five) minutes as needed for chest pain. 10 tablet 3   Omega-3 Fatty Acids (FISH OIL) 1000 MG CAPS Take 2 capsules by mouth daily. 2 capsules am and 1 capsule pm     rosuvastatin (CRESTOR) 20 MG tablet TAKE ONE TABLET BY MOUTH EVERYDAY AT BEDTIME 90 tablet 1   calcium carbonate (TUMS - DOSED IN MG ELEMENTAL CALCIUM) 500 MG chewable tablet Chew 1 tablet by mouth 2 (two) times daily.     No facility-administered medications prior to visit.    Allergies  Allergen Reactions   Candesartan    Clarithromycin    Colesevelam    Duloxetine    Lovaza [Omega-3-Acid Ethyl Esters] Nausea Only    GI upset   Pregabalin     Review of Systems See pertinent positives and negatives per HPI.     Objective:        02/19/2022   10:46 AM 01/09/2022    8:55 AM 09/04/2021   10:01 AM  Vitals with BMI  Height '5\' 2"'$  '5\' 2"'$    Weight 253 lbs 255 lbs   BMI 93.81 82.99   Systolic 371 696 789  Diastolic 82 64 82  Pulse 71 72     No data found.    Physical Exam Vitals reviewed.  Cardiovascular:     Rate and Rhythm: Normal rate and regular rhythm.     Pulses: Normal pulses.     Heart sounds: Normal heart sounds.  Pulmonary:     Effort: Pulmonary effort  is normal.     Breath sounds: Normal breath sounds.  Abdominal:     General: Bowel sounds are normal.     Palpations: Abdomen is soft.  Skin:    Capillary Refill: Capillary refill takes less than 2 seconds.  Neurological:     General: No focal deficit present.     Mental Status: She is alert and oriented to person, place, and time.  Psychiatric:        Mood and Affect: Mood normal.        Behavior: Behavior normal.     Health Maintenance Due  Topic Date Due   Pneumonia Vaccine 5+ Years old (2 - PCV) 11/13/2020   INFLUENZA VACCINE  08/26/2021   COVID-19 Vaccine (4 - 2023-24 season) 09/26/2021       Lab Results  Component Value Date   TSH 4.400 05/17/2020   Lab Results  Component Value Date   WBC 7.6 01/09/2022   HGB 13.3 01/09/2022   HCT 39 01/09/2022   MCV 93 09/04/2021   PLT 180 01/09/2022   Lab Results  Component Value Date   NA 140 01/09/2022   K 4.4 01/09/2022   CO2 28 (A) 01/09/2022   GLUCOSE 104 (H) 09/04/2021   BUN 17 01/09/2022   CREATININE 0.9 01/09/2022   BILITOT 0.3 09/04/2021   ALKPHOS 98 09/04/2021   AST 34 09/04/2021   ALT 43 (H) 09/04/2021   PROT 7.0 09/04/2021   ALBUMIN 4.0 01/09/2022   CALCIUM 9.4 01/09/2022   EGFR >60 01/09/2022   Lab Results  Component Value Date   CHOL 120 01/09/2022   Lab Results  Component Value Date   HDL 42 01/09/2022   Lab Results  Component Value Date   LDLCALC 29 01/09/2022   Lab Results  Component Value Date   TRIG 144 01/09/2022   Lab Results  Component Value Date   CHOLHDL 2.9 09/04/2021   Lab Results  Component Value Date   HGBA1C 5.6 01/09/2022       Assessment & Plan:    1. Urinary incontinence, mixed - Vibegron (GEMTESA) 75 MG TABS; Take 1 tablet (75 mg total) by  mouth daily.  Dispense: 28 tablet; Refill: 0 - Ambulatory referral to Urogynecology  2. Nocturia - POCT URINALYSIS DIP (CLINITEK) - Ambulatory referral to Urogynecology  3. Overactive bladder - Vibegron (GEMTESA) 75 MG TABS; Take 1 tablet (75 mg total) by mouth daily.  Dispense: 28 tablet; Refill: 0 - Ambulatory referral to Urogynecology - Vibegron (GEMTESA) 75 MG TABS; Take 1 tablet (75 mg total) by mouth daily.  Dispense: 30 tablet; Refill: 1  4. Need for vaccination - Pneumococcal conjugate vaccine 20-valent   Begin Gemtesa 75 mg daily for overactive bladder Decrease caffeine in diet Empty bladder every three hours We will call you with referral to urogynecologist Follow-up as needed Keep appt April 15th, 2024 at 9:00 with Dr Tobie Poet Pneumonia vaccine given in office today  Follow-up: PRN  An After Visit Summary was printed and given to the patient.  I, Rip Harbour, NP, have reviewed all documentation for this visit. The documentation on 02/19/22 for the exam, diagnosis, procedures, and orders are all accurate and complete.   Rip Harbour, NP [place] [phone]

## 2022-02-19 NOTE — Patient Instructions (Addendum)
Begin Gemtesa 75 mg daily for overactive bladder Decrease caffeine in diet Empty bladder every three hours We will call you with referral to urogynecologist Follow-up as needed Keep appt April 15th, 2024 at 9:00 with Dr Tobie Poet Pneumonia vaccine given in office today  Pneumococcal Conjugate Vaccine (PCV20) Injection What is this medication? PNEUMOCOCCAL CONJUGATE VACCINE (NEU mo KOK al kon ju gate vak SEEN) reduces the risk of pneumococcal disease, such as pneumonia. It does not treat pneumococcal disease. It is still possible to get pneumococcal disease after receiving this vaccine, but the symptoms may be less severe or not last as long. It works by helping your immune system learn how to fight off a future infection. This medicine may be used for other purposes; ask your health care provider or pharmacist if you have questions. COMMON BRAND NAME(S): Prevnar 20 What should I tell my care team before I take this medication? They need to know if you have any of these conditions: Bleeding disorder Fever Immune system problems An unusual or allergic reaction to pneumococcal vaccine, diphtheria toxoid, other vaccines, other medications, foods, dyes, or preservatives Pregnant or trying to get pregnant Breastfeeding How should I use this medication? This vaccine is injected into a muscle. It is given by your care team. A copy of Vaccine Information Statements will be given before each vaccination. Be sure to read this information carefully each time. This sheet may change often. Talk to your care team about the use of this medication in children. While it may be given to children as young as 6 weeks for selected conditions, precautions do apply. Overdosage: If you think you have taken too much of this medicine contact a poison control center or emergency room at once. NOTE: This medicine is only for you. Do not share this medicine with others. What if I miss a dose? This does not apply. This  medication is not for regular use. What may interact with this medication? Medications for cancer chemotherapy Medications that suppress your immune function Steroid medications, such as prednisone or cortisone This list may not describe all possible interactions. Give your health care provider a list of all the medicines, herbs, non-prescription drugs, or dietary supplements you use. Also tell them if you smoke, drink alcohol, or use illegal drugs. Some items may interact with your medicine. What should I watch for while using this medication? Visit your care team regularly. Report any side effects to your care team right away. This vaccine, like all vaccines, may not fully protect everyone. What side effects may I notice from receiving this medication? Side effects that you should report to your care team as soon as possible: Allergic reactions--skin rash, itching, hives, swelling of the face, lips, tongue, or throat Side effects that usually do not require medical attention (report these to your care team if they continue or are bothersome): Fatigue Fever Headache Joint pain Muscle pain Pain, redness, or irritation at injection site This list may not describe all possible side effects. Call your doctor for medical advice about side effects. You may report side effects to FDA at 1-800-FDA-1088. Where should I keep my medication? This vaccine is only given by your care team. It will not be stored at home. NOTE: This sheet is a summary. It may not cover all possible information. If you have questions about this medicine, talk to your doctor, pharmacist, or health care provider.  2023 Elsevier/Gold Standard (2021-06-24 00:00:00)  Overactive Bladder, Adult  Overactive bladder is a condition in which  a person has a sudden and frequent need to urinate. A person might also leak urine if he or she cannot get to the bathroom fast enough (urinary incontinence). Sometimes, symptoms can interfere  with work or social activities. What are the causes? Overactive bladder is associated with poor nerve signals between your bladder and your brain. Your bladder may get the signal to empty before it is full. You may also have very sensitive muscles that make your bladder squeeze too soon. This condition may also be caused by other factors, such as: Medical conditions: Urinary tract infection. Infection of nearby tissues. Prostate enlargement. Bladder stones, inflammation, or tumors. Diabetes. Muscle or nerve weakness, especially from these conditions: A spinal cord injury. Stroke. Multiple sclerosis. Parkinson's disease. Other causes: Surgery on the uterus or urethra. Drinking too much caffeine or alcohol. Certain medicines, especially those that eliminate extra fluid in the body (diuretics). Constipation. What increases the risk? You may be at greater risk for overactive bladder if you: Are an older adult. Smoke. Are going through menopause. Have prostate problems. Have a neurological disease, such as stroke, dementia, Parkinson's disease, or multiple sclerosis (MS). Eat or drink alcohol, spicy food, caffeine, and other things that irritate the bladder. Are overweight or obese. What are the signs or symptoms? Symptoms of this condition include a sudden, strong urge to urinate. Other symptoms include: Leaking urine. Urinating 8 or more times a day. Waking up to urinate 2 or more times overnight. How is this diagnosed? This condition may be diagnosed based on: Your symptoms and medical history. A physical exam. Blood or urine tests to check for possible causes, such as infection. You may also need to see a health care provider who specializes in urinary tract problems. This is called a urologist. How is this treated? Treatment for overactive bladder depends on the cause of your condition and whether it is mild or severe. Treatment may include: Bladder training, such  as: Learning to control the urge to urinate by following a schedule to urinate at regular intervals. Doing Kegel exercises to strengthen the pelvic floor muscles that support your bladder. Special devices, such as: Biofeedback. This uses sensors to help you become aware of your body's signals. Electrical stimulation. This uses electrodes placed inside the body (implanted) or outside the body. These electrodes send gentle pulses of electricity to strengthen the nerves or muscles that control the bladder. Women may use a plastic device, called a pessary, that fits into the vagina and supports the bladder. Medicines, such as: Antibiotics to treat bladder infection. Antispasmodics to stop the bladder from releasing urine at the wrong time. Tricyclic antidepressants to relax bladder muscles. Injections of botulinum toxin type A directly into the bladder tissue to relax bladder muscles. Surgery, such as: A device may be implanted to help manage the nerve signals that control urination. An electrode may be implanted to stimulate electrical signals in the bladder. A procedure may be done to change the shape of the bladder. This is done only in very severe cases. Follow these instructions at home: Eating and drinking  Make diet or lifestyle changes recommended by your health care provider. These may include: Drinking fluids throughout the day and not only with meals. Cutting down on caffeine or alcohol. Eating a healthy and balanced diet to prevent constipation. This may include: Choosing foods that are high in fiber, such as beans, whole grains, and fresh fruits and vegetables. Limiting foods that are high in fat and processed sugars, such as fried  and sweet foods. Lifestyle  Lose weight if needed. Do not use any products that contain nicotine or tobacco. These include cigarettes, chewing tobacco, and vaping devices, such as e-cigarettes. If you need help quitting, ask your health care  provider. General instructions Take over-the-counter and prescription medicines only as told by your health care provider. If you were prescribed an antibiotic medicine, take it as told by your health care provider. Do not stop taking the antibiotic even if you start to feel better. Use any implants or pessary as told by your health care provider. If needed, wear pads to absorb urine leakage. Keep a log to track how much and when you drink, and when you need to urinate. This will help your health care provider monitor your condition. Keep all follow-up visits. This is important. Contact a health care provider if: You have a fever or chills. Your symptoms do not get better with treatment. Your pain and discomfort get worse. You have more frequent urges to urinate. Get help right away if: You are not able to control your bladder. Summary Overactive bladder refers to a condition in which a person has a sudden and frequent need to urinate. Several conditions may lead to an overactive bladder. Treatment for overactive bladder depends on the cause and severity of your condition. Making lifestyle changes, doing Kegel exercises, keeping a log, and taking medicines can help with this condition. This information is not intended to replace advice given to you by your health care provider. Make sure you discuss any questions you have with your health care provider. Document Revised: 10/02/2019 Document Reviewed: 10/02/2019 Elsevier Patient Education  [place].   Kegel Exercises  Kegel exercises can help strengthen your pelvic floor muscles. The pelvic floor is a group of muscles that support your rectum, small intestine, and bladder. In females, pelvic floor muscles also help support the uterus. These muscles help you control the flow of urine and stool (feces). Kegel exercises are painless and simple. They do not require any equipment. Your provider may suggest Kegel exercises to: Improve  bladder and bowel control. Improve sexual response. Improve weak pelvic floor muscles after surgery to remove the uterus (hysterectomy) or after pregnancy, in females. Improve weak pelvic floor muscles after prostate gland removal or surgery, in males. Kegel exercises involve squeezing your pelvic floor muscles. These are the same muscles you squeeze when you try to stop the flow of urine or keep from passing gas. The exercises can be done while sitting, standing, or lying down, but it is best to vary your position. Ask your health care provider which exercises are safe for you. Do exercises exactly as told by your health care provider and adjust them as directed. Do not begin these exercises until told by your health care provider. Exercises How to do Kegel exercises: Squeeze your pelvic floor muscles tight. You should feel a tight lift in your rectal area. If you are a female, you should also feel a tightness in your vaginal area. Keep your stomach, buttocks, and legs relaxed. Hold the muscles tight for up to 10 seconds. Breathe normally. Relax your muscles for up to 10 seconds. Repeat as told by your health care provider. Repeat this exercise daily as told by your health care provider. Continue to do this exercise for at least 4-6 weeks, or for as long as told by your health care provider. You may be referred to a physical therapist who can help you learn more about how to  do Kegel exercises. Depending on your condition, your health care provider may recommend: Varying how long you squeeze your muscles. Doing several sets of exercises every day. Doing exercises for several weeks. Making Kegel exercises a part of your regular exercise routine. This information is not intended to replace advice given to you by your health care provider. Make sure you discuss any questions you have with your health care provider. Document Revised: 05/23/2020 Document Reviewed: 05/23/2020 Elsevier Patient  Education  [place].  Interstitial Cystitis  Interstitial cystitis is inflammation of the bladder. This condition is also known as painful bladder syndrome. This may cause pain in the bladder area as well as a frequent and urgent need to urinate. The bladder is an organ that stores urine after the urine is made in the kidneys. The severity of interstitial cystitis can vary from person to person. You may have flare-ups, and then your symptoms may go away for a while. For many people, it becomes a long-term (chronic) problem. What are the causes? The cause of this condition is not known. What increases the risk? The following factors may make you more likely to develop this condition: Being female. Having fibromyalgia. Having irritable bowel syndrome (IBS). Having endometriosis. Having chronic fatigue syndrome. This condition may be aggravated by: Stress. Smoking. Spicy foods. What are the signs or symptoms? Symptoms of interstitial cystitis vary, and they can change over time. Symptoms may include: Discomfort or pain in the bladder area, which is in the lower abdomen. Pain can range from mild to severe. The pain may change in intensity as the bladder fills with urine or as it empties. Pain in the pelvic area, between the hip bones. A constant urge to urinate. Frequent urination. Pain during urination. Pain during sex. Blood in the urine. Feeling tired (fatigue). For women, symptoms often get worse during menstruation. How is this diagnosed? This condition is diagnosed based on your symptoms, your medical history, and a physical exam. Your health care provider may need to rule out other conditions and may order other tests, such as: Urine tests. Cystoscopy. For this test, a tool similar to a very thin telescope is used to look into your bladder. Biopsy. This involves taking a sample of tissue from the bladder to be examined under a microscope. How is this treated? There is  no cure for this condition, but treatment can help you control your symptoms. Work closely with your health care provider to find the most effective treatments for you. Treatment options may include: Medicines to relieve pain and reduce how often you feel the need to urinate. This treatment may include: A procedure where a small amount of medicine that eases irritation is put inside your bladder through a catheter (bladder instillation). Lifestyle changes, such as changing your diet or taking steps to control stress. Physical therapy. This may include: Exercises to help relax the pelvic floor muscles. Massage to relax tight muscles (myofascial release). Learning ways to control when you urinate (bladder training). Using a device that provides electrical stimulation to your nerves, which can relieve pain (neuromodulation therapy). The device is placed on your back, where it blocks the nerves that cause you to feel pain in your bladder area. A procedure that stretches your bladder by filling it with air or fluid (hydrodistention). Surgery. This is rare. It is only done for extreme cases, if other treatments do not help. Follow these instructions at home: Lifestyle Learn and practice relaxation techniques, such as deep breathing and muscle  relaxation. Get care for your body and mental well-being, such as: Cognitive behavioral therapy (CBT). This therapy changes the way you think or act in response to different situations. This may improve how you feel. Seeing a mental health therapist to evaluate and treat depression, if necessary. Work with your health care provider on other ways to manage pain. Acupuncture may be helpful. Avoid drinking alcohol. Do not use any products that contain nicotine or tobacco. These products include cigarettes, chewing tobacco, and vaping devices, such as e-cigarettes. If you need help quitting, ask your health care provider. Eating and drinking Make dietary changes as  recommended by your health care provider. You may need to avoid: Spicy foods. Foods that contain a lot of potassium. Limit your intake of drinks that increase your urge to urinate. These include alcohol and caffeinated drinks like soda, coffee, and tea. Bladder training  Use bladder training techniques as directed. Techniques may include: Urinating at scheduled times. Training yourself to delay urination. Keep a bladder diary. Write down the times you urinate and any symptoms that you have. This can help you find out which foods, liquids, or activities make your symptoms worse. Use your bladder diary to schedule bathroom trips. If you are away from home, plan to be near a bathroom at each of your scheduled times. Make sure that you urinate just before you leave the house and just before you go to bed. General instructions Take over-the-counter and prescription medicines only as told by your health care provider. Try a warm or cool compress over your bladder for comfort. Avoid wearing tight clothing. Do exercises to relax your pelvic floor muscles as told by your physical therapist. Keep all follow-up visits. This is important. Where to find more information To find more information or a support group near you, visit: [employer]: urologyhealth.org Interstitial Cystitis Association: [email or web address].com.br Contact a health care provider if you have: Symptoms that do not get better with treatment. Pain or discomfort that gets worse. More frequent urges to urinate. A fever. Get help right away if: You have no control over when you urinate. Summary Interstitial cystitis is inflammation of the bladder. This condition may cause pain in the bladder area as well as a frequent and urgent need to urinate. You may have flare-ups of the condition, and then it may go away for a while. For many people, it becomes a long-term (chronic) problem. There is no cure for interstitial cystitis, but  treatment methods are available to control your symptoms. This information is not intended to replace advice given to you by your health care provider. Make sure you discuss any questions you have with your health care provider. Document Revised: 08/18/2019 Document Reviewed: 08/18/2019 Elsevier Patient Education  [place].

## 2022-03-16 ENCOUNTER — Encounter: Payer: Self-pay | Admitting: *Deleted

## 2022-03-23 ENCOUNTER — Telehealth: Payer: Self-pay

## 2022-03-23 NOTE — Telephone Encounter (Signed)
Compliant on meds 

## 2022-03-23 NOTE — Progress Notes (Cosign Needed Addendum)
Care Management & Coordination Services Pharmacy Team  Reason for Encounter: Medication coordination and delivery  Contacted patient to discuss medications and coordinate delivery from [facility].  Spoke with patient on 03/23/2022   Cycle dispensing form sent to [place] [facility] for review.   Last adherence delivery date:01/02/22      Patient is due for next adherence delivery on: 04/02/22  This delivery to include: Adherence Packaging  90 Days  Allopurinol '300mg'$ -1 breakfast Metoprolol ER Succinate '25mg'$ -1 at breakfast Rosuvastatin '20mg'$ -1 bedtime     Patient declined the following medications this month: Edarbi 80 mg- Gets at another pharmacy cheaper Vibegron '75mg'$ -Received at [facility] on 02/19/22 30ds Omega 3- OTC Vitamin D3 66mg-OTC Aspirin '81mg'$ -OTC Nitrogylcerin 0.4-Only prn, does not need  Refills requested from providers include: Allopurinol '300mg'$  Metoprolol ER Succinate '25mg'$  Rosuvastatin '20mg'$   Confirmed delivery date of 04/02/22, advised patient that pharmacy will contact them the morning of delivery.   Any concerns about your medications? No  How often do you forget or accidentally miss a dose? Never  Do you use a pillbox? No  Is patient in packaging Yes  If yes  What is the date on your next pill pack? 04/05/22  Any concerns or issues with your packaging? No concerns    Recent blood pressure readings are as follows: 03/23/22 151/74 (before medication) took it an hour later 145/79, 02/19/22 128/82  Chart review: Recent office visits:  02/19/22 HJerrell BelfastNP. Seen for UTI. Started on Vibegron '75mg'$  daily. Referral to Urogynecology.   01/09/22 CRochel BromeMD. Seen for routine visit. Started on Nitroglycerin 0.'4mg'$ . D/C Albuterol Sulfate and Amitriptyline '50mg'$ .   Recent consult visits:  None  Hospital visits:  None  Medications: Outpatient Encounter Medications as of 03/23/2022  Medication Sig Note   allopurinol (ZYLOPRIM) 300 MG tablet Take 1 tablet  (300 mg total) by mouth daily.    aspirin EC 81 MG tablet Take 81 mg by mouth daily.    cholecalciferol (VITAMIN D3) 25 MCG (1000 UNIT) tablet Take 1,000 Units by mouth daily.    EDARBI 80 MG TABS TAKE 1 TABLET BY MOUTH ONCE DAILY    metoprolol succinate (TOPROL-XL) 25 MG 24 hr tablet Take 1 tablet (25 mg total) by mouth daily.    Multiple Vitamins-Minerals (WOMENS MULTIVITAMIN PLUS PO) Take 1 tablet by mouth every morning.    nitroGLYCERIN (NITROSTAT) 0.4 MG SL tablet Place 1 tablet (0.4 mg total) under the tongue every 5 (five) minutes as needed for chest pain.    Omega-3 Fatty Acids (FISH OIL) 1000 MG CAPS Take 2 capsules by mouth daily. 2 capsules am and 1 capsule pm 08/05/2020: Patient unable to tolerate 2 capsules am and pm due to belching fish taste at night if taking 2. Discussed trying to store fish oil in refrigerator to reduce belching fishy taste.    rosuvastatin (CRESTOR) 20 MG tablet TAKE ONE TABLET BY MOUTH EVERYDAY AT BEDTIME    Vibegron (GEMTESA) 75 MG TABS Take 1 tablet (75 mg total) by mouth daily.    Vibegron (GEMTESA) 75 MG TABS Take 1 tablet (75 mg total) by mouth daily.    No facility-administered encounter medications on file as of 03/23/2022.   BP Readings from Last 3 Encounters:  02/19/22 128/82  01/09/22 120/64  09/04/21 118/82    Pulse Readings from Last 3 Encounters:  02/19/22 71  01/09/22 72  09/04/21 76    Lab Results  Component Value Date/Time   HGBA1C 5.6 01/09/2022 12:00 AM  HGBA1C 5.9 (H) 09/04/2021 09:57 AM   HGBA1C 6.0 (H) 05/01/2021 10:19 AM   Lab Results  Component Value Date   CREATININE 0.9 01/09/2022   BUN 17 01/09/2022   GFRNONAA 55 (L) 02/15/2020   GFRAA 64 02/15/2020   NA 140 01/09/2022   K 4.4 01/09/2022   CALCIUM 9.4 01/09/2022   CO2 28 (A) 01/09/2022     Cheryl Beard, [place] Clinical Pharmacist Assistant  [phone]

## 2022-03-27 ENCOUNTER — Other Ambulatory Visit: Payer: Self-pay | Admitting: Family Medicine

## 2022-03-30 ENCOUNTER — Telehealth: Payer: Self-pay

## 2022-03-30 ENCOUNTER — Other Ambulatory Visit (HOSPITAL_COMMUNITY): Payer: Self-pay

## 2022-03-30 NOTE — Progress Notes (Cosign Needed)
03-30-2022: Received message from [facility] to clarify with patient if she requested medications to be transferred to [facility]. Left patient a VM to confirm. Patient called back and stated to transfer medications to [facility] long and don't send delivery that was scheduled for 04-02-2022.  [place] Pharmacist Assistant [phone]

## 2022-03-31 ENCOUNTER — Other Ambulatory Visit (HOSPITAL_COMMUNITY): Payer: Self-pay

## 2022-04-01 ENCOUNTER — Other Ambulatory Visit (HOSPITAL_COMMUNITY): Payer: Self-pay

## 2022-04-01 ENCOUNTER — Other Ambulatory Visit: Payer: Self-pay

## 2022-04-01 MED ORDER — ROSUVASTATIN CALCIUM 20 MG PO TABS
20.0000 mg | ORAL_TABLET | Freq: Every day | ORAL | 1 refills | Status: DC
  Filled 2022-04-01: qty 90, 90d supply, fill #0
  Filled 2022-07-01: qty 90, 90d supply, fill #1

## 2022-04-01 MED ORDER — METOPROLOL SUCCINATE ER 25 MG PO TB24
25.0000 mg | ORAL_TABLET | Freq: Every day | ORAL | 1 refills | Status: DC
  Filled 2022-04-01: qty 90, 90d supply, fill #0
  Filled 2022-07-01: qty 90, 90d supply, fill #1

## 2022-04-01 MED ORDER — ALLOPURINOL 300 MG PO TABS
300.0000 mg | ORAL_TABLET | Freq: Every day | ORAL | 1 refills | Status: DC
  Filled 2022-04-01: qty 90, 90d supply, fill #0
  Filled 2022-07-01: qty 90, 90d supply, fill #1

## 2022-04-01 MED ORDER — NITROGLYCERIN 0.4 MG SL SUBL
SUBLINGUAL_TABLET | SUBLINGUAL | 3 refills | Status: DC
  Filled 2022-04-01: qty 25, 8d supply, fill #0
  Filled 2022-07-01: qty 25, 8d supply, fill #1
  Filled 2022-07-07: qty 25, 8d supply, fill #2

## 2022-04-01 NOTE — Telephone Encounter (Cosign Needed)
Spoke with pt on 04/01/22 for recent BP readings  Evening:  03/31/22 145/74 62  In the morning 03/30/22 131/73 80 03/29/22 134/64 66 03/28/22 130/68 68 03/27/22 121/62 62 03/26/22 124/66 63  Pt is exercising and losing weight to help with BP   Elray Mcgregor, [place] Pharmacist Assistant  [phone]

## 2022-04-07 DIAGNOSIS — H5711 Ocular pain, right eye: Secondary | ICD-10-CM | POA: Diagnosis not present

## 2022-04-07 DIAGNOSIS — T1501XA Foreign body in cornea, right eye, initial encounter: Secondary | ICD-10-CM | POA: Diagnosis not present

## 2022-05-06 ENCOUNTER — Encounter: Payer: Self-pay | Admitting: Obstetrics and Gynecology

## 2022-05-06 ENCOUNTER — Ambulatory Visit (INDEPENDENT_AMBULATORY_CARE_PROVIDER_SITE_OTHER): Payer: PPO | Admitting: Obstetrics and Gynecology

## 2022-05-06 VITALS — BP 166/81 | HR 66 | Ht 61.7 in | Wt 247.0 lb

## 2022-05-06 DIAGNOSIS — N993 Prolapse of vaginal vault after hysterectomy: Secondary | ICD-10-CM

## 2022-05-06 DIAGNOSIS — N8110 Cystocele, unspecified: Secondary | ICD-10-CM

## 2022-05-06 DIAGNOSIS — R350 Frequency of micturition: Secondary | ICD-10-CM

## 2022-05-06 DIAGNOSIS — N3281 Overactive bladder: Secondary | ICD-10-CM | POA: Diagnosis not present

## 2022-05-06 DIAGNOSIS — N816 Rectocele: Secondary | ICD-10-CM

## 2022-05-06 LAB — POCT URINALYSIS DIPSTICK
Bilirubin, UA: NEGATIVE
Glucose, UA: NEGATIVE
Ketones, UA: NEGATIVE
Leukocytes, UA: NEGATIVE
Nitrite, UA: NEGATIVE
Protein, UA: NEGATIVE
Spec Grav, UA: 1.015 (ref 1.010–1.025)
Urobilinogen, UA: 0.2 E.U./dL
pH, UA: 6 (ref 5.0–8.0)
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
## 2022-05-06 NOTE — Patient Instructions (Signed)

## 2022-05-10 NOTE — Progress Notes (Unsigned)
Subjective:  Patient ID: Cheryl Beard, female    DOB: March 29, 1955  Age: 67 y.o. MRN: [id number]  Chief Complaint  Patient presents with   Hyperlipidemia   Hypertension    HPI   PreDiabetes:  Eating healthy Most recent A1C: 5.6.  Hyperlipidemia: Current medications: rosuvastastin 20 mg once before bed and fish oil 1000 mg qam   Hypertensive heart disease: Complications: CAD Current medications: edarbi 80 mg daily and toprol xl 25 daily. Has NTG.  Overactive bladder: Patient is currently taking Gemtesa 75 mg taking once daily.  Diet     05/11/2022    9:08 AM 01/09/2022    9:02 AM 06/06/2021    9:32 AM 06/04/2020    8:11 AM 05/17/2020    9:16 AM  Depression screen PHQ 2/9  Decreased Interest 0 0 0 0 0  Down, Depressed, Hopeless 0 0 0 0 0  PHQ - 2 Score 0 0 0 0 0         05/17/2020    9:15 AM 06/04/2021    1:00 PM 06/06/2021    9:35 AM 01/09/2022    9:02 AM 05/11/2022    9:08 AM  Fall Risk  Falls in the past year? 1 1 1  0 0  Was there an injury with Fall? 0 1 1 0 0  Fall Risk Category Calculator 2 3 3  0 0  Fall Risk Category (Retired) Moderate High High Low   (RETIRED) Patient Fall Risk Level   High fall risk Low fall risk   Patient at Risk for Falls Due to History of fall(s)  Impaired balance/gait;Impaired vision No Fall Risks No Fall Risks  Fall risk Follow up Falls prevention discussed  Education provided;Falls prevention discussed;Falls evaluation completed Falls evaluation completed Falls evaluation completed      Review of Systems  Constitutional:  Negative for appetite change, fatigue and fever.  HENT:  Negative for congestion, ear pain, sinus pressure and sore throat.   Respiratory:  Negative for cough, chest tightness, shortness of breath and wheezing.   Cardiovascular:  Negative for chest pain and palpitations.  Gastrointestinal:  Negative for abdominal pain, constipation, diarrhea, nausea and vomiting.  Genitourinary:  Negative for dysuria and  hematuria.  Musculoskeletal:  Negative for arthralgias, back pain, joint swelling and myalgias.  Skin:  Negative for rash.  Neurological:  Negative for dizziness, weakness and headaches.  Psychiatric/Behavioral:  Negative for dysphoric mood. The patient is not nervous/anxious.     Current Outpatient Medications on File Prior to Visit  Medication Sig Dispense Refill   allopurinol (ZYLOPRIM) 300 MG tablet Take 1 tablet (300 mg total) by mouth daily. 90 tablet 1   aspirin EC 81 MG tablet Take 81 mg by mouth daily.     cholecalciferol (VITAMIN D3) 25 MCG (1000 UNIT) tablet Take 1,000 Units by mouth daily.     EDARBI 80 MG TABS TAKE 1 TABLET BY MOUTH ONCE DAILY 90 tablet 2   metoprolol succinate (TOPROL-XL) 25 MG 24 hr tablet Take 1 tablet (25 mg total) by mouth daily. 90 tablet 1   Multiple Vitamins-Minerals (WOMENS MULTIVITAMIN PLUS PO) Take 1 tablet by mouth every morning.     nitroGLYCERIN (NITROSTAT) 0.4 MG SL tablet Dissolve 1 tablet under the tongue every 5 minutes as needed for chest pain. Do not exceed a total of 3 doses in 15 minutes. 25 tablet 3   Omega-3 Fatty Acids (FISH OIL) 1000 MG CAPS Take 2 capsules by mouth daily. 2 capsules am and  1 capsule pm     rosuvastatin (CRESTOR) 20 MG tablet Take 1 tablet (20 mg total) by mouth at bedtime. 90 tablet 1   Vibegron (GEMTESA) 75 MG TABS Take 1 tablet (75 mg total) by mouth daily. 30 tablet 1   No current facility-administered medications on file prior to visit.   Past Medical History:  Diagnosis Date   Allergy    CAD (coronary artery disease)    Depression    Erosive esophagitis    Erosive gastritis    Fibromyalgia    Gallbladder disease    GERD (gastroesophageal reflux disease)    History of Clostridium difficile infection    History of COVID-19    History of myocardial infarction    Hyperlipidemia    Hypertension    Hypertensive urgency    Idiopathic gout    Myocardial infarction    2010   Nontoxic multinodular goiter     Other asthma    Vitamin D deficiency    Past Surgical History:  Procedure Laterality Date   ABDOMINAL HYSTERECTOMY     one ovary removed   APPENDECTOMY     CHOLECYSTECTOMY     ROTATOR CUFF REPAIR      Family History  Problem Relation Age of Onset   Cancer Mother    Parkinson's disease Mother    Diabetes Mother    Hypertension Mother    Stroke Father    Hyperlipidemia Sister    Hypertension Sister    Fibromyalgia Sister    Cancer Sister    Alzheimer's disease Maternal Grandmother    Cancer Paternal Grandmother        Leukemia   Breast cancer Neg Hx    Social History   Socioeconomic History   Marital status: Widowed    Spouse name: Not on file   Number of children: 3   Years of education: Not on file   Highest education level: GED or equivalent  Occupational History   Not on file  Tobacco Use   Smoking status: Former    Types: Cigarettes    Quit date: 2002    Years since quitting: 22.3   Smokeless tobacco: Never  Vaping Use   Vaping Use: Never used  Substance and Sexual Activity   Alcohol use: Never   Drug use: Never   Sexual activity: Not Currently  Other Topics Concern   Not on file  Social History Narrative   Not on file   Social Determinants of Health   Financial Resource Strain: Low Risk  (05/07/2022)   Overall Financial Resource Strain (CARDIA)    Difficulty of Paying Living Expenses: Not very hard  Food Insecurity: No Food Insecurity (05/07/2022)   Hunger Vital Sign    Worried About Running Out of Food in the Last Year: Never true    Ran Out of Food in the Last Year: Never true  Transportation Needs: No Transportation Needs (05/07/2022)   PRAPARE - Administrator, Civil Service (Medical): No    Lack of Transportation (Non-Medical): No  Physical Activity: Inactive (05/07/2022)   Exercise Vital Sign    Days of Exercise per Week: 3 days    Minutes of Exercise per Session: 0 min  Stress: No Stress Concern Present (05/07/2022)   [employer] of Occupational Health - Occupational Stress Questionnaire    Feeling of Stress : Not at all  Social Connections: Moderately Isolated (05/07/2022)   Social Connection and Isolation Panel [NHANES]    Frequency of Communication  with Friends and Family: More than three times a week    Frequency of Social Gatherings with Friends and Family: Twice a week    Attends Religious Services: More than 4 times per year    Active Member of [employer] or Organizations: No    Attends Banker Meetings: Never    Marital Status: Widowed    Objective:  BP 132/78 (BP Location: Left Arm, Patient Position: Sitting)   Pulse (!) 55   Temp (!) 97.3 F (36.3 C) (Temporal)   Ht 5' 1.7" (1.567 m)   Wt 249 lb (112.9 kg)   SpO2 98%   BMI 45.99 kg/m      05/13/2022    9:14 AM 05/13/2022    9:12 AM 05/11/2022    9:25 AM  BP/Weight  Systolic BP 149 150 132  Diastolic BP 80 85 78    Physical Exam Vitals reviewed.  Constitutional:      Appearance: Normal appearance. She is obese.  Cardiovascular:     Rate and Rhythm: Normal rate and regular rhythm.     Pulses: Normal pulses.     Heart sounds: Normal heart sounds.  Pulmonary:     Effort: Pulmonary effort is normal.     Breath sounds: Normal breath sounds.  Abdominal:     General: Abdomen is flat. Bowel sounds are normal.     Palpations: Abdomen is soft.  Musculoskeletal:     Left ankle: Swelling present.  Neurological:     Mental Status: She is alert and oriented to person, place, and time.  Psychiatric:        Mood and Affect: Mood normal.        Behavior: Behavior normal.     Diabetic Foot Exam - Simple   No data filed      Lab Results  Component Value Date   WBC 7.3 05/11/2022   HGB 13.5 05/11/2022   HCT 40.1 05/11/2022   PLT 184 05/11/2022   GLUCOSE 93 05/11/2022   CHOL 114 05/11/2022   TRIG 105 05/11/2022   HDL 43 05/11/2022   LDLCALC 51 05/11/2022   ALT 21 05/11/2022   AST 23 05/11/2022   NA 141 05/11/2022    K 4.7 05/11/2022   CL 103 05/11/2022   CREATININE 0.88 05/11/2022   BUN 15 05/11/2022   CO2 24 05/11/2022   TSH 2.170 05/11/2022   HGBA1C 5.5 05/11/2022      Assessment & Plan:    Hypertensive heart disease without heart failure Assessment & Plan: The current medical regimen is effective;  continue present plan and medications.  Continue edarbi 80 mg daily and toprol xl 25 daily. Has NTG.   Orders: -     Comprehensive metabolic panel -     CBC with Differential/Platelet  Prediabetes Assessment & Plan: Hemoglobin A1c 5.6%, 3 month avg of blood sugars, is in prediabetic range.  In order to prevent progression to diabetes, recommend low carb diet and regular exercise   Orders: -     Hemoglobin A1c  Mixed hyperlipidemia Assessment & Plan: Well controlled.  No changes to medicines. Continue rosuvastastin 20 mg once before bed and fish oil 1000 mg qam  Continue to work on eating a healthy diet and exercise.  Labs drawn today.    Orders: -     Lipid panel -     TSH -     VITAMIN D 25 Hydroxy (Vit-D Deficiency, Fractures)  Overactive bladder Assessment & Plan: The current medical  regimen is effective;  continue present plan and medications.  Taking Gemtesa 75 mg taking once daily.    Morbid obesity with BMI of 45.0-49.9, adult Assessment & Plan: Recommend continue to work on eating healthy diet and exercise.    Other orders -     Cardiovascular Risk Assessment     No orders of the defined types were placed in this encounter.   Orders Placed This Encounter  Procedures   Comprehensive metabolic panel   Lipid panel   CBC with Differential/Platelet   Hemoglobin A1c   TSH   VITAMIN D 25 Hydroxy (Vit-D Deficiency, Fractures)   Cardiovascular Risk Assessment     Follow-up: Return in about 6 months (around 11/10/2022) for chronic, Fasting.   I,Marla I Leal-Borjas,acting as a scribe for Blane Ohara, MD.,have documented all relevant documentation on the  behalf of Blane Ohara, MD,as directed by  Blane Ohara, MD while in the presence of Blane Ohara, MD.   An After Visit Summary was printed and given to the patient.  Blane Ohara, MD [facility] [phone]

## 2022-05-10 NOTE — Assessment & Plan Note (Signed)
Well controlled.  No changes to medicines. Continue rosuvastastin 20 mg once before bed and fish oil 1000 mg qam  Continue to work on eating a healthy diet and exercise.  Labs drawn today.   

## 2022-05-10 NOTE — Assessment & Plan Note (Signed)
Hemoglobin A1c 5.6%, 3 month avg of blood sugars, is in prediabetic range.  In order to prevent progression to diabetes, recommend low carb diet and regular exercise  

## 2022-05-10 NOTE — Assessment & Plan Note (Signed)
The current medical regimen is effective;  continue present plan and medications.  Continue edarbi 80 mg daily and toprol xl 25 daily. Has NTG.  

## 2022-05-11 ENCOUNTER — Ambulatory Visit (INDEPENDENT_AMBULATORY_CARE_PROVIDER_SITE_OTHER): Payer: PPO | Admitting: Family Medicine

## 2022-05-11 VITALS — BP 132/78 | HR 55 | Temp 97.3°F | Ht 61.7 in | Wt 249.0 lb

## 2022-05-11 DIAGNOSIS — E782 Mixed hyperlipidemia: Secondary | ICD-10-CM

## 2022-05-11 DIAGNOSIS — N3281 Overactive bladder: Secondary | ICD-10-CM

## 2022-05-11 DIAGNOSIS — Z6841 Body Mass Index (BMI) 40.0 and over, adult: Secondary | ICD-10-CM

## 2022-05-11 DIAGNOSIS — I119 Hypertensive heart disease without heart failure: Secondary | ICD-10-CM | POA: Diagnosis not present

## 2022-05-11 DIAGNOSIS — R7303 Prediabetes: Secondary | ICD-10-CM | POA: Diagnosis not present

## 2022-05-11 HISTORY — DX: Overactive bladder: N32.81

## 2022-05-11 NOTE — Progress Notes (Addendum)
[facility]   Subjective:     Chief Complaint: Pessary fitting Cheryl Beard is a 67 y.o. female is here for pessary fitting.)  History of Present Illness: Cheryl Beard is a 67 y.o. female with stage II pelvic organ prolapse who presents today for a pessary fitting.    Past Medical History: Patient  has a past medical history of Allergy, CAD (coronary artery disease), Depression, Erosive esophagitis, Erosive gastritis, Fibromyalgia, Gallbladder disease, GERD (gastroesophageal reflux disease), History of Clostridium difficile infection, History of COVID-19, History of myocardial infarction, Hyperlipidemia, Hypertension, Hypertensive urgency, Idiopathic gout, Myocardial infarction, Nontoxic multinodular goiter, Other asthma, and Vitamin D deficiency.   Past Surgical History: She  has a past surgical history that includes Abdominal hysterectomy; Cholecystectomy; Appendectomy; and Rotator cuff repair.   Medications: She has a current medication list which includes the following prescription(s): allopurinol, aspirin ec, cholecalciferol, edarbi, metoprolol succinate, multiple vitamins-minerals, nitroglycerin, fish oil, rosuvastatin, and gemtesa.   Allergies: Patient is allergic to candesartan, clarithromycin, colesevelam, duloxetine, lovaza [omega-3-acid ethyl esters], and pregabalin.   Social History: Patient  reports that she quit smoking about 22 years ago. Her smoking use included cigarettes. She has never used smokeless tobacco. She reports that she does not drink alcohol and does not use drugs.      Objective:    BP (!) 149/80   Pulse 60  Gen: No apparent distress, A&O x 3. Pelvic Exam: Normal external female genitalia; Bartholin's and Skene's glands normal in appearance; urethral meatus normal in appearance, no urethral masses or discharge.   Attempted a size #3 donut which was expelled when patient attempted to urinate Attempted a 2.75in (#5)  Short  Stem Gellhorn was fitted. It was comfortable to patient.   A size #5 short stem gellhorn pessary (Lot [id number]) was fitted. It was comfortable, stayed in place with valsalva and was an appropriate size on examination, with one finger fitting between the pessary and the vaginal walls. Patient was able to attempt urination and a bowel movement without pessary expulsion.    Assessment/Plan:    Assessment: Ms. [name] is a 67 y.o. with stage II pelvic organ prolapse who presents for a pessary fitting. Plan: She was fitted with a #5 short stem gellhorn pessary. She will keep the pessary in place until next visit. She will use lubricant.   Follow-up in 3-4 weeks for a pessary check or sooner as needed.  All questions were answered.    Selmer Dominion, NP

## 2022-05-12 LAB — CBC WITH DIFFERENTIAL/PLATELET
Basophils Absolute: 0 10*3/uL (ref 0.0–0.2)
Basos: 1 %
EOS (ABSOLUTE): 0.1 10*3/uL (ref 0.0–0.4)
Eos: 1 %
Hematocrit: 40.1 % (ref 34.0–46.6)
Hemoglobin: 13.5 g/dL (ref 11.1–15.9)
Immature Grans (Abs): 0 10*3/uL (ref 0.0–0.1)
Immature Granulocytes: 0 %
Lymphocytes Absolute: 2.7 10*3/uL (ref 0.7–3.1)
Lymphs: 37 %
MCH: 31 pg (ref 26.6–33.0)
MCHC: 33.7 g/dL (ref 31.5–35.7)
MCV: 92 fL (ref 79–97)
Monocytes Absolute: 0.4 10*3/uL (ref 0.1–0.9)
Monocytes: 6 %
Neutrophils Absolute: 4.1 10*3/uL (ref 1.4–7.0)
Neutrophils: 55 %
Platelets: 184 10*3/uL (ref 150–450)
RBC: 4.36 x10E6/uL (ref 3.77–5.28)
RDW: 13.4 % (ref 11.7–15.4)
WBC: 7.3 10*3/uL (ref 3.4–10.8)

## 2022-05-12 LAB — LIPID PANEL
Chol/HDL Ratio: 2.7 ratio (ref 0.0–4.4)
Cholesterol, Total: 114 mg/dL (ref 100–199)
HDL: 43 mg/dL (ref >39)
LDL Chol Calc (NIH): 51 mg/dL (ref 0–99)
Triglycerides: 105 mg/dL (ref 0–149)
VLDL Cholesterol Cal: 20 mg/dL (ref 5–40)

## 2022-05-12 LAB — COMPREHENSIVE METABOLIC PANEL
ALT: 21 IU/L (ref 0–32)
AST: 23 IU/L (ref 0–40)
Albumin/Globulin Ratio: 2 (ref 1.2–2.2)
Albumin: 4.2 g/dL (ref 3.9–4.9)
Alkaline Phosphatase: 89 IU/L (ref 44–121)
BUN/Creatinine Ratio: 17 (ref 12–28)
BUN: 15 mg/dL (ref 8–27)
Bilirubin Total: 0.3 mg/dL (ref 0.0–1.2)
CO2: 24 mmol/L (ref 20–29)
Calcium: 9.5 mg/dL (ref 8.7–10.3)
Chloride: 103 mmol/L (ref 96–106)
Creatinine, Ser: 0.88 mg/dL (ref 0.57–1.00)
Globulin, Total: 2.1 g/dL (ref 1.5–4.5)
Glucose: 93 mg/dL (ref 70–99)
Potassium: 4.7 mmol/L (ref 3.5–5.2)
Sodium: 141 mmol/L (ref 134–144)
Total Protein: 6.3 g/dL (ref 6.0–8.5)
eGFR: 72 mL/min/{1.73_m2} (ref >59)

## 2022-05-12 LAB — HEMOGLOBIN A1C
Est. average glucose Bld gHb Est-mCnc: 111 mg/dL
Hgb A1c MFr Bld: 5.5 % (ref 4.8–5.6)

## 2022-05-12 LAB — CARDIOVASCULAR RISK ASSESSMENT

## 2022-05-12 LAB — VITAMIN D 25 HYDROXY (VIT D DEFICIENCY, FRACTURES): Vit D, 25-Hydroxy: 42.7 ng/mL (ref 30.0–100.0)

## 2022-05-12 LAB — TSH: TSH: 2.17 u[IU]/mL (ref 0.450–4.500)

## 2022-05-13 ENCOUNTER — Encounter: Payer: Self-pay | Admitting: Obstetrics and Gynecology

## 2022-05-13 ENCOUNTER — Ambulatory Visit (INDEPENDENT_AMBULATORY_CARE_PROVIDER_SITE_OTHER): Payer: PPO | Admitting: Obstetrics and Gynecology

## 2022-05-13 VITALS — BP 149/80 | HR 60

## 2022-05-13 DIAGNOSIS — N993 Prolapse of vaginal vault after hysterectomy: Secondary | ICD-10-CM | POA: Diagnosis not present

## 2022-05-13 DIAGNOSIS — Z4689 Encounter for fitting and adjustment of other specified devices: Secondary | ICD-10-CM

## 2022-05-13 DIAGNOSIS — N8110 Cystocele, unspecified: Secondary | ICD-10-CM

## 2022-05-13 DIAGNOSIS — N816 Rectocele: Secondary | ICD-10-CM

## 2022-05-13 NOTE — Patient Instructions (Signed)
Use vaginal lubricant or moisturizer a few times a week for vaginal wall thinning

## 2022-05-15 NOTE — Assessment & Plan Note (Signed)
The current medical regimen is effective;  continue present plan and medications.  Taking Gemtesa 75 mg taking once daily.

## 2022-05-15 NOTE — Assessment & Plan Note (Signed)
Recommend continue to work on eating healthy diet and exercise.  

## 2022-05-17 ENCOUNTER — Encounter: Payer: Self-pay | Admitting: Family Medicine

## 2022-05-28 NOTE — Progress Notes (Signed)
Request has been submitted for teir reduction through [employer]. Teir reduction is pending There will be a 72hr turn around for a response  Case #: [id number] Call ref #: [id number]

## 2022-06-03 ENCOUNTER — Telehealth: Payer: Self-pay

## 2022-06-03 NOTE — Telephone Encounter (Signed)
Patient left voicemail stating Cheryl Beard is too expensive and would like something else.

## 2022-06-04 ENCOUNTER — Other Ambulatory Visit: Payer: Self-pay | Admitting: Family Medicine

## 2022-06-04 MED ORDER — OXYBUTYNIN CHLORIDE 5 MG PO TABS: 5.0000 mg | ORAL_TABLET | Freq: Three times a day (TID) | ORAL | 2 refills | Status: DC

## 2022-06-05 ENCOUNTER — Other Ambulatory Visit: Payer: Self-pay

## 2022-06-05 MED ORDER — OXYBUTYNIN CHLORIDE 5 MG PO TABS
5.0000 mg | ORAL_TABLET | Freq: Three times a day (TID) | ORAL | 2 refills | Status: DC
  Filled 2022-06-05: qty 90, 30d supply, fill #0

## 2022-06-05 NOTE — Telephone Encounter (Signed)
Patient informed. 

## 2022-06-06 ENCOUNTER — Other Ambulatory Visit (HOSPITAL_COMMUNITY): Payer: Self-pay

## 2022-06-08 NOTE — Progress Notes (Signed)
PA was denied. I spoke to the patient and she had her pcp start her on oxybutynin. I explained with her elevated BP that couls be why Dr.Schroeder prescribed Gemtesa. I explained I am in the process of filing an appeal for Gemtesa to be covered. Pt verbalized understanding. Pt said she has an appointment on 06/10/22 with Job Founds NP and will pick up samples at that time.

## 2022-06-09 NOTE — Progress Notes (Unsigned)
[facility]   Subjective:     Chief Complaint: No chief complaint on file.  History of Present Illness: Cheryl Beard is a 67 y.o. female with stage II pelvic organ prolapse who presents for a pessary check. She is using a size #5 short stem gellhorn pessary. The pessary has been working well and she has no complaints. She {ACTION; IS/IS UEA:[id number]} using vaginal estrogen. She denies vaginal bleeding.  Past Medical History: Patient  has a past medical history of Allergy, CAD (coronary artery disease), Depression, Erosive esophagitis, Erosive gastritis, Fibromyalgia, Gallbladder disease, GERD (gastroesophageal reflux disease), History of Clostridium difficile infection, History of COVID-19, History of myocardial infarction, Hyperlipidemia, Hypertension, Hypertensive urgency, Idiopathic gout, Myocardial infarction (HCC), Nontoxic multinodular goiter, Other asthma, and Vitamin D deficiency.   Past Surgical History: She  has a past surgical history that includes Abdominal hysterectomy; Cholecystectomy; Appendectomy; and Rotator cuff repair.   Medications: She has a current medication list which includes the following prescription(s): allopurinol, aspirin ec, cholecalciferol, edarbi, metoprolol succinate, multiple vitamins-minerals, nitroglycerin, fish oil, oxybutynin, and rosuvastatin.   Allergies: Patient is allergic to candesartan, clarithromycin, colesevelam, duloxetine, lovaza [omega-3-acid ethyl esters], and pregabalin.   Social History: Patient  reports that she quit smoking about 22 years ago. Her smoking use included cigarettes. She has never used smokeless tobacco. She reports that she does not drink alcohol and does not use drugs.      Objective:    Physical Exam: There were no vitals taken for this visit. Gen: No apparent distress, A&O x 3. Detailed Urogynecologic Evaluation:  Pelvic Exam: Normal external female genitalia; Bartholin's and Skene's glands  normal in appearance; urethral meatus {urethra:24773}, no urethral masses or discharge. The pessary was noted to be {in place:24774}. It was removed and cleaned. Speculum exam revealed {vaginal lesions:24775} in the vagina. The pessary was replaced. It was comfortable to the patient and fit well.    Assessment/Plan:    Assessment: Cheryl Beard is a 67 y.o. with stage II pelvic organ prolapse here for a pessary check. She is doing well.  Plan: She will {pessary plan:24776}. She will continue to use {lubricant:24777}. She will follow-up in *** {days/wks/mos/yrs:310907} for a pessary check or sooner as needed.  All questions were answered.   Time Spent:

## 2022-06-10 ENCOUNTER — Ambulatory Visit (INDEPENDENT_AMBULATORY_CARE_PROVIDER_SITE_OTHER): Payer: PPO | Admitting: Obstetrics and Gynecology

## 2022-06-10 ENCOUNTER — Encounter: Payer: Self-pay | Admitting: Obstetrics and Gynecology

## 2022-06-10 VITALS — BP 153/70 | HR 71

## 2022-06-10 DIAGNOSIS — N3281 Overactive bladder: Secondary | ICD-10-CM | POA: Diagnosis not present

## 2022-06-10 DIAGNOSIS — N8110 Cystocele, unspecified: Secondary | ICD-10-CM

## 2022-06-10 DIAGNOSIS — N993 Prolapse of vaginal vault after hysterectomy: Secondary | ICD-10-CM

## 2022-06-10 DIAGNOSIS — Z4689 Encounter for fitting and adjustment of other specified devices: Secondary | ICD-10-CM

## 2022-06-10 DIAGNOSIS — N816 Rectocele: Secondary | ICD-10-CM

## 2022-06-10 NOTE — Patient Instructions (Signed)
We are working on getting the [place] covered.

## 2022-06-12 ENCOUNTER — Other Ambulatory Visit: Payer: Self-pay

## 2022-06-12 ENCOUNTER — Other Ambulatory Visit (HOSPITAL_COMMUNITY): Payer: Self-pay

## 2022-06-12 MED ORDER — GEMTESA 75 MG PO TABS
75.0000 mg | ORAL_TABLET | Freq: Every day | ORAL | 6 refills | Status: DC
  Filled 2022-06-12: qty 30, 30d supply, fill #0
  Filled 2022-07-07: qty 30, 30d supply, fill #1
  Filled 2022-08-06: qty 30, 30d supply, fill #2
  Filled 2022-09-09: qty 30, 30d supply, fill #3
  Filled 2022-10-09: qty 30, 30d supply, fill #4
  Filled 2022-11-07: qty 30, 30d supply, fill #5

## 2022-06-12 NOTE — Progress Notes (Signed)
Tier exception for [facility] as Approved through [employer] advantage.  [facility] was notified and medication will cost pt $47.  Pt was notified.

## 2022-06-13 ENCOUNTER — Other Ambulatory Visit (HOSPITAL_COMMUNITY): Payer: Self-pay

## 2022-06-16 ENCOUNTER — Other Ambulatory Visit (HOSPITAL_COMMUNITY): Payer: Self-pay

## 2022-06-16 ENCOUNTER — Ambulatory Visit (INDEPENDENT_AMBULATORY_CARE_PROVIDER_SITE_OTHER): Payer: PPO

## 2022-06-16 VITALS — BP 130/80 | HR 61 | Resp 16 | Ht 62.0 in | Wt 249.0 lb

## 2022-06-16 DIAGNOSIS — N959 Unspecified menopausal and perimenopausal disorder: Secondary | ICD-10-CM

## 2022-06-16 DIAGNOSIS — Z9189 Other specified personal risk factors, not elsewhere classified: Secondary | ICD-10-CM | POA: Diagnosis not present

## 2022-06-16 DIAGNOSIS — Z0000 Encounter for general adult medical examination without abnormal findings: Secondary | ICD-10-CM

## 2022-06-16 MED ORDER — OXYBUTYNIN CHLORIDE 5 MG PO TABS
5.0000 mg | ORAL_TABLET | Freq: Three times a day (TID) | ORAL | 2 refills | Status: DC
  Filled 2022-06-16: qty 90, 30d supply, fill #0

## 2022-06-16 NOTE — Progress Notes (Signed)
Subjective:   Cheryl Beard is a 67 y.o. female who presents for Medicare Annual (Subsequent) preventive examination.  This wellness visit is conducted by a nurse.  The patient's medications were reviewed and reconciled since the patient's last visit.  History details were provided by the patient.  The history appears to be reliable.    Medical History: Patient history and Family history was reviewed  Medications, Allergies, and preventative health maintenance was reviewed and updated.  Cardiac Risk Factors include: advanced age (>44men, >38 women);obesity (BMI >30kg/m2)     Objective:    Today's Vitals   06/12/22 0844 06/16/22 1004  BP:  130/80  Pulse:  61  Resp:  16  SpO2:  97%  Weight:  249 lb (112.9 kg)  Height:  5\' 2"  (1.575 m)  PainSc: 0-No pain 0-No pain   Body mass index is 45.54 kg/m.     06/06/2021    9:34 AM 12/13/2019   10:08 AM 05/09/2019    9:16 AM 06/27/2018   12:20 PM  Advanced Directives  Does Patient Have a Medical Advance Directive? Yes Yes Yes No  Type of Estate agent of [place];Living will Living will Healthcare Power of March ARB;Living will   Does patient want to make changes to medical advance directive?  Yes (Inpatient - patient defers changing a medical advance directive and declines information at this time)    Copy of Healthcare Power of Attorney in Chart? No - copy requested     Would patient like information on creating a medical advance directive?    Yes (MAU/Ambulatory/Procedural Areas - Information given)    Current Medications (verified) Outpatient Encounter Medications as of 06/16/2022  Medication Sig   allopurinol (ZYLOPRIM) 300 MG tablet Take 1 tablet (300 mg total) by mouth daily.   aspirin EC 81 MG tablet Take 81 mg by mouth daily.   cholecalciferol (VITAMIN D3) 25 MCG (1000 UNIT) tablet Take 1,000 Units by mouth daily.   EDARBI 80 MG TABS TAKE 1 TABLET BY MOUTH ONCE DAILY   metoprolol succinate (TOPROL-XL)  25 MG 24 hr tablet Take 1 tablet (25 mg total) by mouth daily.   Multiple Vitamins-Minerals (WOMENS MULTIVITAMIN PLUS PO) Take 1 tablet by mouth every morning.   nitroGLYCERIN (NITROSTAT) 0.4 MG SL tablet Dissolve 1 tablet under the tongue every 5 minutes as needed for chest pain. Do not exceed a total of 3 doses in 15 minutes.   Omega-3 Fatty Acids (FISH OIL) 1000 MG CAPS Take 2 capsules by mouth daily. 2 capsules am and 1 capsule pm   rosuvastatin (CRESTOR) 20 MG tablet Take 1 tablet (20 mg total) by mouth at bedtime.   Vibegron (GEMTESA) 75 MG TABS Take 1 tablet (75 mg total) by mouth daily.   No facility-administered encounter medications on file as of 06/16/2022.    Allergies (verified) Candesartan, Clarithromycin, Colesevelam, Duloxetine, Lovaza [omega-3-acid ethyl esters], and Pregabalin   History: Past Medical History:  Diagnosis Date   Allergy    CAD (coronary artery disease)    Depression    Erosive esophagitis    Erosive gastritis    Fibromyalgia    Gallbladder disease    GERD (gastroesophageal reflux disease)    History of Clostridium difficile infection    History of COVID-19    History of myocardial infarction    Hyperlipidemia    Hypertension    Hypertensive urgency    Idiopathic gout    Myocardial infarction (HCC)    2010  Nontoxic multinodular goiter    Other asthma    Vitamin D deficiency    Past Surgical History:  Procedure Laterality Date   ABDOMINAL HYSTERECTOMY     one ovary removed   APPENDECTOMY     CATARACT EXTRACTION  10/2021   CHOLECYSTECTOMY     ROTATOR CUFF REPAIR     Family History  Problem Relation Age of Onset   Cancer Mother    Parkinson's disease Mother    Diabetes Mother    Hypertension Mother    Stroke Father    Hyperlipidemia Sister    Hypertension Sister    Fibromyalgia Sister    Cancer Sister    Alzheimer's disease Maternal Grandmother    Cancer Paternal Grandmother        Leukemia   Breast cancer Neg Hx    Social  History   Socioeconomic History   Marital status: Widowed    Spouse name: Not on file   Number of children: 3   Years of education: Not on file   Highest education level: GED or equivalent  Occupational History   Not on file  Tobacco Use   Smoking status: Former    Types: Cigarettes    Quit date: 2002    Years since quitting: 22.4   Smokeless tobacco: Never  Vaping Use   Vaping Use: Never used  Substance and Sexual Activity   Alcohol use: Never   Drug use: Never   Sexual activity: Not Currently  Other Topics Concern   Not on file  Social History Narrative   Not on file   Social Determinants of Health   Financial Resource Strain: Low Risk  (06/12/2022)   Overall Financial Resource Strain (CARDIA)    Difficulty of Paying Living Expenses: Not very hard  Food Insecurity: No Food Insecurity (06/12/2022)   Hunger Vital Sign    Worried About Running Out of Food in the Last Year: Never true    Ran Out of Food in the Last Year: Never true  Transportation Needs: No Transportation Needs (06/12/2022)   PRAPARE - Administrator, Civil Service (Medical): No    Lack of Transportation (Non-Medical): No  Physical Activity: Sufficiently Active (06/12/2022)   Exercise Vital Sign    Days of Exercise per Week: 5 days    Minutes of Exercise per Session: 30 min  Recent Concern: Physical Activity - Inactive (06/12/2022)   Exercise Vital Sign    Days of Exercise per Week: 0 days    Minutes of Exercise per Session: 0 min  Stress: No Stress Concern Present (06/12/2022)   [employer] of Occupational Health - Occupational Stress Questionnaire    Feeling of Stress : Only a little  Social Connections: Moderately Isolated (06/12/2022)   Social Connection and Isolation Panel [NHANES]    Frequency of Communication with Friends and Family: More than three times a week    Frequency of Social Gatherings with Friends and Family: Twice a week    Attends Religious Services: More than 4 times  per year    Active Member of [employer] or Organizations: No    Attends Banker Meetings: Never    Marital Status: Widowed    Tobacco Counseling Counseling given: Not Answered   Clinical Intake:  Pre-visit preparation completed: Yes Pain : No/denies pain Pain Score: 0-No pain   BMI - recorded: 45.54 Nutritional Status: BMI > 30  Obese Nutritional Risks: None Diabetes: No How often do you need to have someone help  you when you read instructions, pamphlets, or other written materials from your doctor or pharmacy?: 1 - Never Interpreter Needed?: No    Activities of Daily Living    06/12/2022    8:44 AM  In your present state of health, do you have any difficulty performing the following activities:  Hearing? 0  Vision? 0  Difficulty concentrating or making decisions? 0  Walking or climbing stairs? 0  Dressing or bathing? 0  Doing errands, shopping? 0  Preparing Food and eating ? N  Using the Toilet? N  In the past six months, have you accidently leaked urine? Y  Do you have problems with loss of bowel control? N  Managing your Medications? N  Managing your Finances? N  Housekeeping or managing your Housekeeping? N    Patient Care Team: Blane Ohara, MD as PCP - General (Family Medicine) Georgeanna Lea, MD as Consulting Physician (Cardiology)     Assessment:   This is a routine wellness examination for Cheryl Beard.  Hearing/Vision screen No results found.  Dietary issues and exercise activities discussed: Current Exercise Habits: Home exercise routine, Type of exercise: walking, Time (Minutes): 30, Frequency (Times/Week): 5, Weekly Exercise (Minutes/Week): 150, Intensity: Mild, Exercise limited by: None identified   Depression Screen    06/16/2022   10:34 AM 05/11/2022    9:08 AM 01/09/2022    9:02 AM 06/06/2021    9:32 AM 06/04/2020    8:11 AM 05/17/2020    9:16 AM 02/15/2020    9:35 AM  PHQ 2/9 Scores  PHQ - 2 Score 0 0 0 0 0 0 0    Fall  Risk    06/12/2022    8:44 AM 05/11/2022    9:08 AM 01/09/2022    9:02 AM 06/06/2021    9:35 AM 06/04/2021    1:00 PM  Fall Risk   Falls in the past year? 1 0 0 1 1  Number falls in past yr: 1 0 0 1 1  Injury with Fall? 1 0 0 1 1  Risk for fall due to : History of fall(s) No Fall Risks No Fall Risks Impaired balance/gait;Impaired vision   Follow up  Falls evaluation completed Falls evaluation completed Education provided;Falls prevention discussed;Falls evaluation completed     FALL RISK PREVENTION PERTAINING TO THE HOME:  Any stairs in or around the home? No  If so, are there any without handrails? No  Home free of loose throw rugs in walkways, pet beds, electrical cords, etc? Yes  Adequate lighting in your home to reduce risk of falls? Yes   ASSISTIVE DEVICES UTILIZED TO PREVENT FALLS:  Life alert? No  Use of a cane, walker or w/c? No  Grab bars in the bathroom? No  Shower chair or bench in shower? No  Elevated toilet seat or a handicapped toilet? No   Gait steady and fast without use of assistive device  Cognitive Function:        06/16/2022   10:39 AM 06/06/2021    9:37 AM 12/13/2019   10:10 AM  6CIT Screen  What Year? 0 points 0 points 0 points  What month? 0 points 0 points 0 points  What time? 0 points 0 points 0 points  Count back from 20 0 points 0 points 0 points  Months in reverse 0 points 0 points 0 points  Repeat phrase 0 points 0 points 0 points  Total Score 0 points 0 points 0 points    Immunizations Immunization History  Administered Date(s) Administered   Fluad Quad(high Dose 65+) 11/14/2019, 09/20/2020   Moderna SARS-COV2 Booster Vaccination 01/23/2020   Moderna Sars-Covid-2 Vaccination 03/10/2019, 04/05/2019   PNEUMOCOCCAL CONJUGATE-20 02/19/2022   Pneumococcal Polysaccharide-23 11/14/2019   Tdap 05/01/2021   Zoster Recombinat (Shingrix) 05/01/2021, 11/05/2021    TDAP status: Up to date  Flu Vaccine status: Up to date  Pneumococcal  vaccine status: Up to date  Covid-19 vaccine status: Completed vaccines  Qualifies for Shingles Vaccine? Yes   Zostavax completed No   Shingrix Completed?: Yes  Screening Tests Health Maintenance  Topic Date Due   Medicare Annual Wellness (AWV)  06/07/2022   COVID-19 Vaccine (4 - 2023-24 season) 01/27/2023 (Originally 09/26/2021)   INFLUENZA VACCINE  08/27/2022   MAMMOGRAM  12/25/2022   COLONOSCOPY (Pts 45-85yrs Insurance coverage will need to be confirmed)  04/03/2030   DTaP/Tdap/Td (2 - Td or Tdap) 05/02/2031   Pneumonia Vaccine 66+ Years old  Completed   DEXA SCAN  Completed   Hepatitis C Screening  Completed   Zoster Vaccines- Shingrix  Completed   HPV VACCINES  Aged Out    Health Maintenance  Health Maintenance Due  Topic Date Due   Medicare Annual Wellness (AWV)  06/07/2022    Colorectal cancer screening: Type of screening: Colonoscopy. Completed 04/02/2020. Repeat every 10 years  Mammogram status: Completed 11/2021. Repeat every year  Bone Density status: Completed 01/2020. Results reflect: Bone density results: OSTEOPENIA. Repeat every 1 years.  Lung Cancer Screening: (Low Dose CT Chest recommended if Age 60-80 years, 30 pack-year currently smoking OR have quit w/in 15years.) does not qualify.   Lung Cancer Screening Referral: N/A  Additional Screening:  Vision Screening: Recommended annual ophthalmology exams for early detection of glaucoma and other disorders of the eye. Is the patient up to date with their annual eye exam?  Yes  Who is the provider or what is the name of the office in which the patient attends annual eye exams? [facility]  Dental Screening: Recommended annual dental exams for proper oral hygiene  Community Resource Referral / Chronic Care Management: CRR required this visit?  No   CCM required this visit?  No      Plan:    1- DEXA ordered 2- Flu vaccine due in the fall  I have personally reviewed and noted the following in the  patient's chart:   Medical and social history Use of alcohol, tobacco or illicit drugs  Current medications and supplements including opioid prescriptions. Patient is not currently taking opioid prescriptions. Functional ability and status Nutritional status Physical activity Advanced directives List of other physicians Hospitalizations, surgeries, and ER visits in previous 12 months Vitals Screenings to include cognitive, depression, and falls Referrals and appointments  In addition, I have reviewed and discussed with patient certain preventive protocols, quality metrics, and best practice recommendations. A written personalized care plan for preventive services as well as general preventive health recommendations were provided to patient.     Jacklynn Bue, LPN   1/61/0960

## 2022-06-16 NOTE — Patient Instructions (Signed)
[name] , Thank you for taking time to come for your Medicare Wellness Visit. I appreciate your ongoing commitment to your health goals. Please review the following plan we discussed and let me know if I can assist you in the future.   Screening recommendations/referrals: Colonoscopy: Due 2032 Mammogram: Due in November Bone Density: Ordered Recommended yearly ophthalmology/optometry visit for glaucoma screening and checkup Recommended yearly dental visit for hygiene and checkup  Vaccinations: Influenza vaccine: Due in the fall Pneumococcal vaccine: Complete Tdap vaccine: Due 2033 Shingles vaccine: Complete     Preventive Care 65 Years and Older, Female   Preventive care refers to lifestyle choices and visits with your health care provider that can promote health and wellness.  What does preventive care include? A yearly physical exam. This is also called an annual well check. Dental exams once or twice a year. Routine eye exams. Ask your health care provider how often you should have your eyes checked. Personal lifestyle choices, including: Daily care of your teeth and gums. Regular physical activity. Eating a healthy diet. Avoiding tobacco and drug use. Limiting alcohol use. Practicing safe sex. Taking low-dose aspirin every day. Taking vitamin and mineral supplements as recommended by your health care provider.  What happens during an annual well check? The services and screenings done by your health care provider during your annual well check will depend on your age, overall health, lifestyle risk factors, and family history of disease.  Counseling Your health care provider may ask you questions about your: Alcohol use. Tobacco use. Drug use. Emotional well-being. Home and relationship well-being. Sexual activity. Eating habits. History of falls. Memory and ability to understand (cognition). Work and work Astronomer. Reproductive health.  Screening You  may have the following tests or measurements: Height, weight, and BMI. Blood pressure. Lipid and cholesterol levels. These may be checked every 5 years, or more frequently if you are over 8 years old. Skin check. Lung cancer screening. You may have this screening every year starting at age 38 if you have a 30-pack-year history of smoking and currently smoke or have quit within the past 15 years. Fecal occult blood test (FOBT) of the stool. You may have this test every year starting at age 38. Flexible sigmoidoscopy or colonoscopy. You may have a sigmoidoscopy every 5 years or a colonoscopy every 10 years starting at age 69. Hepatitis C blood test. Hepatitis B blood test. Sexually transmitted disease (STD) testing. Diabetes screening. This is done by checking your blood sugar (glucose) after you have not eaten for a while (fasting). You may have this done every 1-3 years. Bone density scan. This is done to screen for osteoporosis. You may have this done starting at age 54. Mammogram. This may be done every 1-2 years. Talk to your health care provider about how often you should have regular mammograms. Talk with your health care provider about your test results, treatment options, and if necessary, the need for more tests.  Vaccines Your health care provider may recommend certain vaccines, such as: Influenza vaccine. This is recommended every year. Tetanus, diphtheria, and acellular pertussis (Tdap, Td) vaccine. You may need a Td booster every 10 years. Zoster vaccine. You may need this after age 76. Pneumococcal 13-valent conjugate (PCV13) vaccine. One dose is recommended after age 21. Pneumococcal polysaccharide (PPSV23) vaccine. One dose is recommended after age 98. Talk to your health care provider about which screenings and vaccines you need and how often you need them.  This information is not  intended to replace advice given to you by your health care provider. Make sure you discuss any  questions you have with your health care provider. Document Released: 02/08/2015 Document Revised: 10/02/2015 Document Reviewed: 11/13/2014 Elsevier Interactive Patient Education  2017 [employer].   Fall Prevention in the Home  Falls can cause injuries. They can happen to people of all ages. There are many things you can do to make your home safe and to help prevent falls.  What can I do on the outside of my home? Regularly fix the edges of walkways and driveways and fix any cracks. Remove anything that might make you trip as you walk through a door, such as a raised step or threshold. Trim any bushes or trees on the path to your home. Use bright outdoor lighting. Clear any walking paths of anything that might make someone trip, such as rocks or tools. Regularly check to see if handrails are loose or broken. Make sure that both sides of any steps have handrails. Any raised decks and porches should have guardrails on the edges. Have any leaves, snow, or ice cleared regularly. Use sand or salt on walking paths during winter. Clean up any spills in your garage right away. This includes oil or grease spills.  What can I do in the bathroom? Use night lights. Install grab bars by the toilet and in the tub and shower. Do not use towel bars as grab bars. Use non-skid mats or decals in the tub or shower. If you need to sit down in the shower, use a plastic, non-slip stool. Keep the floor dry. Clean up any water that spills on the floor as soon as it happens. Remove soap buildup in the tub or shower regularly. Attach bath mats securely with double-sided non-slip rug tape. Do not have throw rugs and other things on the floor that can make you trip.  What can I do in the bedroom? Use night lights. Make sure that you have a light by your bed that is easy to reach. Do not use any sheets or blankets that are too big for your bed. They should not hang down onto the floor. Have a firm chair that  has side arms. You can use this for support while you get dressed. Do not have throw rugs and other things on the floor that can make you trip.  What can I do in the kitchen? Clean up any spills right away. Avoid walking on wet floors. Keep items that you use a lot in easy-to-reach places. If you need to reach something above you, use a strong step stool that has a grab bar. Keep electrical cords out of the way. Do not use floor polish or wax that makes floors slippery. If you must use wax, use non-skid floor wax. Do not have throw rugs and other things on the floor that can make you trip.  What can I do with my stairs? Do not leave any items on the stairs. Make sure that there are handrails on both sides of the stairs and use them. Fix handrails that are broken or loose. Make sure that handrails are as long as the stairways. Check any carpeting to make sure that it is firmly attached to the stairs. Fix any carpet that is loose or worn. Avoid having throw rugs at the top or bottom of the stairs. If you do have throw rugs, attach them to the floor with carpet tape. Make sure that you have a light  switch at the top of the stairs and the bottom of the stairs. If you do not have them, ask someone to add them for you.  What else can I do to help prevent falls? Wear shoes that: Do not have high heels. Have rubber bottoms. Are comfortable and fit you well. Are closed at the toe. Do not wear sandals. If you use a stepladder: Make sure that it is fully opened. Do not climb a closed stepladder. Make sure that both sides of the stepladder are locked into place. Ask someone to hold it for you, if possible. Clearly mark and make sure that you can see: Any grab bars or handrails. First and last steps. Where the edge of each step is. Use tools that help you move around (mobility aids) if they are needed. These include: Canes. Walkers. Scooters. Crutches. Turn on the lights when you go into a  dark area. Replace any light bulbs as soon as they burn out. Set up your furniture so you have a clear path. Avoid moving your furniture around. If any of your floors are uneven, fix them. If there are any pets around you, be aware of where they are. Review your medicines with your doctor. Some medicines can make you feel dizzy. This can increase your chance of falling. Ask your doctor what other things that you can do to help prevent falls.  This information is not intended to replace advice given to you by your health care provider. Make sure you discuss any questions you have with your health care provider. Document Released: 11/08/2008 Document Revised: 06/20/2015 Document Reviewed: 02/16/2014 Elsevier Interactive Patient Education  2017 [employer].

## 2022-06-24 ENCOUNTER — Telehealth: Payer: Self-pay | Admitting: Family Medicine

## 2022-06-24 NOTE — Telephone Encounter (Signed)
   Cheryl Beard has been scheduled for the following appointment:  WHAT: BONE DENSITY  WHERE: [facility] DATE: 07/02/22 TIME: 2:00 PM CHECK-IN  Patient has been made aware.

## 2022-07-01 ENCOUNTER — Other Ambulatory Visit (HOSPITAL_COMMUNITY): Payer: Self-pay

## 2022-07-01 ENCOUNTER — Other Ambulatory Visit: Payer: Self-pay

## 2022-07-02 ENCOUNTER — Other Ambulatory Visit: Payer: Self-pay

## 2022-07-02 DIAGNOSIS — M8589 Other specified disorders of bone density and structure, multiple sites: Secondary | ICD-10-CM | POA: Diagnosis not present

## 2022-07-02 DIAGNOSIS — N959 Unspecified menopausal and perimenopausal disorder: Secondary | ICD-10-CM | POA: Diagnosis not present

## 2022-07-02 LAB — HM DEXA SCAN: HM Dexa Scan: UNDETERMINED

## 2022-07-03 ENCOUNTER — Encounter: Payer: Self-pay | Admitting: Family Medicine

## 2022-07-07 ENCOUNTER — Other Ambulatory Visit (HOSPITAL_COMMUNITY): Payer: Self-pay

## 2022-07-07 ENCOUNTER — Other Ambulatory Visit: Payer: Self-pay

## 2022-07-21 ENCOUNTER — Other Ambulatory Visit: Payer: Self-pay | Admitting: Family Medicine

## 2022-08-06 ENCOUNTER — Other Ambulatory Visit (HOSPITAL_COMMUNITY): Payer: Self-pay

## 2022-09-09 ENCOUNTER — Other Ambulatory Visit (HOSPITAL_COMMUNITY): Payer: Self-pay

## 2022-09-09 ENCOUNTER — Other Ambulatory Visit: Payer: Self-pay

## 2022-09-10 ENCOUNTER — Other Ambulatory Visit (HOSPITAL_COMMUNITY): Payer: Self-pay

## 2022-09-10 ENCOUNTER — Encounter: Payer: Self-pay | Admitting: Obstetrics and Gynecology

## 2022-09-10 ENCOUNTER — Ambulatory Visit: Payer: PPO | Admitting: Obstetrics and Gynecology

## 2022-09-10 VITALS — BP 149/84 | HR 59

## 2022-09-10 DIAGNOSIS — N993 Prolapse of vaginal vault after hysterectomy: Secondary | ICD-10-CM | POA: Diagnosis not present

## 2022-09-10 DIAGNOSIS — N3090 Cystitis, unspecified without hematuria: Secondary | ICD-10-CM

## 2022-09-10 DIAGNOSIS — N952 Postmenopausal atrophic vaginitis: Secondary | ICD-10-CM | POA: Diagnosis not present

## 2022-09-10 DIAGNOSIS — Z4689 Encounter for fitting and adjustment of other specified devices: Secondary | ICD-10-CM

## 2022-09-10 MED ORDER — ESTRADIOL 10 MCG VA TABS
1.0000 | ORAL_TABLET | VAGINAL | 0 refills | Status: DC
Start: 2022-09-10 — End: 2022-09-10
  Filled 2022-09-10: qty 16, 21d supply, fill #0

## 2022-09-10 MED ORDER — ESTRADIOL 0.1 MG/GM VA CREA
0.5000 g | TOPICAL_CREAM | VAGINAL | 11 refills | Status: DC
Start: 2022-09-10 — End: 2022-11-10
  Filled 2022-09-10: qty 42.5, 90d supply, fill #0

## 2022-09-10 NOTE — Progress Notes (Signed)
[facility]   Subjective:     Chief Complaint:  Chief Complaint  Patient presents with   Pessary Check    Cheryl Beard is a 67 y.o. female is here for pessary check.   History of Present Illness: Cheryl Beard is a 67 y.o. female with stage II pelvic organ prolapse who presents for a pessary check. She is using a size #5 short stem gellhorn pessary. The pessary has been working well and she has no complaints. She is not using vaginal estrogen. She reports a small amount of spotting.   She endorses having symptoms of a UTI without positive cultures at times and irritation surrounding her urethra.   Past Medical History: Patient  has a past medical history of Allergy, CAD (coronary artery disease), Depression, Erosive esophagitis, Erosive gastritis, Fibromyalgia, Gallbladder disease, GERD (gastroesophageal reflux disease), History of Clostridium difficile infection, History of COVID-19, History of myocardial infarction, Hyperlipidemia, Hypertension, Hypertensive urgency, Idiopathic gout, Myocardial infarction (HCC), Nontoxic multinodular goiter, Other asthma, and Vitamin D deficiency.   Past Surgical History: She  has a past surgical history that includes Abdominal hysterectomy; Cholecystectomy; Appendectomy; Rotator cuff repair; and Cataract extraction (10/2021).   Medications: She has a current medication list which includes the following prescription(s): allopurinol, aspirin ec, cholecalciferol, edarbi, metoprolol succinate, multiple vitamins-minerals, nitroglycerin, fish oil, rosuvastatin, gemtesa, and oxybutynin.   Allergies: Patient is allergic to candesartan, clarithromycin, colesevelam, duloxetine, lovaza [omega-3-acid ethyl esters], and pregabalin.   Social History: Patient  reports that she quit smoking about 22 years ago. Her smoking use included cigarettes. She has never used smokeless tobacco. She reports that she does not drink alcohol and does not  use drugs.      Objective:    Physical Exam: BP (!) 149/84   Pulse (!) 59  Gen: No apparent distress, A&O x 3. Detailed Urogynecologic Evaluation:  Pelvic Exam: Normal external female genitalia; Bartholin's and Skene's glands normal in appearance; urethral meatus normal in appearance, no urethral masses or discharge. The pessary was noted to be in place. It was removed and cleaned. Speculum exam revealed erythema in the vagina, especially on the left vaginal wall. The pessary was replaced with estrogen cream applied to irritated region. It was comfortable to the patient and fit well.    Assessment/Plan:    Assessment: [name] is a 67 y.o. with stage II pelvic organ prolapse here for a pessary check. She is doing well.  Plan: She will keep the pessary in place until next visit. She will continue to use estrogen. She will follow-up in 3 months for a pessary check or sooner as needed.   Estrogen cream sent to pharmacy for patient as the Yuvafem and Tablets are not covered by her insurance. Encouraged her to use the estrogen cream twice weekly into the vagina for tissue health with the pessary use. Encouraged her to use this around the urethra as well as this may treat the urethral irritation and burning she is describing without it being an active UTI.    All questions were answered.

## 2022-09-23 ENCOUNTER — Other Ambulatory Visit: Payer: Self-pay

## 2022-09-23 ENCOUNTER — Other Ambulatory Visit: Payer: Self-pay | Admitting: Family Medicine

## 2022-09-23 ENCOUNTER — Other Ambulatory Visit (HOSPITAL_COMMUNITY): Payer: Self-pay

## 2022-09-23 MED ORDER — ROSUVASTATIN CALCIUM 20 MG PO TABS
20.0000 mg | ORAL_TABLET | Freq: Every day | ORAL | 1 refills | Status: DC
  Filled 2022-09-23: qty 90, 90d supply, fill #0
  Filled 2022-12-24: qty 90, 90d supply, fill #1

## 2022-09-23 MED ORDER — NITROGLYCERIN 0.4 MG SL SUBL
SUBLINGUAL_TABLET | SUBLINGUAL | 3 refills | Status: AC
  Filled 2022-09-23: qty 25, 30d supply, fill #0
  Filled 2022-10-09: qty 25, 8d supply, fill #0

## 2022-09-23 MED ORDER — METOPROLOL SUCCINATE ER 25 MG PO TB24
25.0000 mg | ORAL_TABLET | Freq: Every day | ORAL | 1 refills | Status: DC
  Filled 2022-09-23: qty 90, 90d supply, fill #0
  Filled 2022-12-24: qty 90, 90d supply, fill #1

## 2022-09-23 MED ORDER — ALLOPURINOL 300 MG PO TABS
300.0000 mg | ORAL_TABLET | Freq: Every day | ORAL | 1 refills | Status: DC
  Filled 2022-09-23: qty 90, 90d supply, fill #0
  Filled 2022-12-24: qty 90, 90d supply, fill #1

## 2022-10-10 ENCOUNTER — Other Ambulatory Visit (HOSPITAL_COMMUNITY): Payer: Self-pay

## 2022-10-12 ENCOUNTER — Other Ambulatory Visit: Payer: Self-pay

## 2022-10-15 ENCOUNTER — Encounter: Payer: Self-pay | Admitting: Obstetrics and Gynecology

## 2022-10-15 ENCOUNTER — Ambulatory Visit: Payer: PPO | Admitting: Obstetrics and Gynecology

## 2022-10-15 VITALS — BP 158/84 | HR 65

## 2022-10-15 DIAGNOSIS — N993 Prolapse of vaginal vault after hysterectomy: Secondary | ICD-10-CM | POA: Diagnosis not present

## 2022-10-15 DIAGNOSIS — N8110 Cystocele, unspecified: Secondary | ICD-10-CM

## 2022-10-15 DIAGNOSIS — N816 Rectocele: Secondary | ICD-10-CM

## 2022-10-15 NOTE — Progress Notes (Signed)
[facility]   Subjective:     Chief Complaint:  Chief Complaint  Patient presents with   Pessary Check   History of Present Illness: Cheryl Beard is a 67 y.o. female with stage II pelvic organ prolapse who presents for a pessary check. She is using a size #5 short stem gellhorn pessary. The pessary came out recently with a hard bowel movement. She is not using vaginal estrogen as she reports it was causing her breasts to be sore. She denies vaginal bleeding.  Past Medical History: Patient  has a past medical history of Allergy, CAD (coronary artery disease), Depression, Erosive esophagitis, Erosive gastritis, Fibromyalgia, Gallbladder disease, GERD (gastroesophageal reflux disease), History of Clostridium difficile infection, History of COVID-19, History of myocardial infarction, Hyperlipidemia, Hypertension, Hypertensive urgency, Idiopathic gout, Myocardial infarction (HCC), Nontoxic multinodular goiter, Other asthma, and Vitamin D deficiency.   Past Surgical History: She  has a past surgical history that includes Abdominal hysterectomy; Cholecystectomy; Appendectomy; Rotator cuff repair; and Cataract extraction (10/2021).   Medications: She has a current medication list which includes the following prescription(s): allopurinol, aspirin ec, cholecalciferol, edarbi, estradiol, metoprolol succinate, multiple vitamins-minerals, nitroglycerin, fish oil, oxybutynin, rosuvastatin, and gemtesa.   Allergies: Patient is allergic to candesartan, clarithromycin, colesevelam, duloxetine, lovaza [omega-3-acid ethyl esters], and pregabalin.   Social History: Patient  reports that she quit smoking about 22 years ago. Her smoking use included cigarettes. She has never used smokeless tobacco. She reports that she does not drink alcohol and does not use drugs.      Objective:    Physical Exam: BP (!) 158/84   Pulse 65  Gen: No apparent distress, A&O x 3. Detailed  Urogynecologic Evaluation:  Pelvic Exam: Normal external female genitalia; Bartholin's and Skene's glands normal in appearance; urethral meatus normal in appearance, no urethral masses or discharge. The pessary was noted to be dislodged. It was removed and cleaned. Speculum exam revealed no lesions in the vagina. The pessary was replaced. It was comfortable to the patient and fit well.    Assessment/Plan:    Assessment: Cheryl Beard is a 67 y.o. with stage II pelvic organ prolapse here for a pessary check. She is doing well.  Plan: She will keep the pessary in place until next visit. She will continue to use lubricant. She will follow-up in 3 months for a pessary check or sooner as needed.  All questions were answered.   Time Spent:

## 2022-11-09 ENCOUNTER — Other Ambulatory Visit (HOSPITAL_COMMUNITY): Payer: Self-pay

## 2022-11-09 ENCOUNTER — Other Ambulatory Visit: Payer: Self-pay

## 2022-11-09 NOTE — Assessment & Plan Note (Signed)
The current medical regimen is effective;  continue present plan and medications.  Taking Gemtesa 75 mg taking once daily.

## 2022-11-09 NOTE — Progress Notes (Unsigned)
Subjective:  Patient ID: Cheryl Beard, female    DOB: April 02, 1955  Age: 67 y.o. MRN: [id number]  Chief Complaint  Patient presents with   Medical Management of Chronic Issues    HPI PreDiabetes:  Eating healthy Most recent A1C: 5.5.   Hyperlipidemia: Current medications: rosuvastastin 20 mg once before bed and fish oil 1000 mg qam    Hypertensive heart disease: Complications: CAD Current medications: Rosuvastatin 20 mg daily, edarbi 80 mg daily and toprol xl 25 daily. Has NTG. Wants to discuss possible cheaper medications.   Overactive bladder: Patient is currently taking Gemtesa 75 mg taking once daily.   Diet: trying to eat healthy. Walks for exercise. 7-10000 steps per day.     11/10/2022    8:54 AM 06/16/2022   10:34 AM 05/11/2022    9:08 AM 01/09/2022    9:02 AM 06/06/2021    9:32 AM  Depression screen PHQ 2/9  Decreased Interest 0 0 0 0 0  Down, Depressed, Hopeless 0 0 0 0 0  PHQ - 2 Score 0 0 0 0 0        06/12/2022    8:44 AM  Fall Risk   Falls in the past year? 1  Number falls in past yr: 1  Injury with Fall? 1  Risk for fall due to : History of fall(s)    Patient Care Team: Blane Ohara, MD as PCP - General (Family Medicine) Georgeanna Lea, MD as Consulting Physician (Cardiology)   Review of Systems  Constitutional:  Negative for chills, fatigue and fever.  HENT:  Negative for congestion, ear pain, rhinorrhea and sore throat.   Respiratory:  Negative for cough and shortness of breath.   Cardiovascular:  Negative for chest pain.  Gastrointestinal:  Negative for abdominal pain, constipation, diarrhea, nausea and vomiting.  Genitourinary:  Negative for dysuria and urgency.  Musculoskeletal:  Negative for back pain and myalgias.  Neurological:  Negative for dizziness, weakness, light-headedness and headaches.  Psychiatric/Behavioral:  Negative for dysphoric mood. The patient is not nervous/anxious.     Current Outpatient Medications on  File Prior to Visit  Medication Sig Dispense Refill   allopurinol (ZYLOPRIM) 300 MG tablet Take 1 tablet (300 mg total) by mouth daily. 90 tablet 1   aspirin EC 81 MG tablet Take 81 mg by mouth daily.     cholecalciferol (VITAMIN D3) 25 MCG (1000 UNIT) tablet Take 1,000 Units by mouth daily.     EDARBI 80 MG TABS TAKE 1 TABLET BY MOUTH ONCE DAILY 90 tablet 3   metoprolol succinate (TOPROL-XL) 25 MG 24 hr tablet Take 1 tablet (25 mg total) by mouth daily. 90 tablet 1   Multiple Vitamins-Minerals (WOMENS MULTIVITAMIN PLUS PO) Take 1 tablet by mouth every morning.     nitroGLYCERIN (NITROSTAT) 0.4 MG SL tablet Dissolve 1 tablet under the tongue every 5 minutes as needed for chest pain. Do not exceed a total of 3 doses in 15 minutes. 25 tablet 3   Omega-3 Fatty Acids (FISH OIL) 1000 MG CAPS Take 2 capsules by mouth daily. 2 capsules am and 1 capsule pm     rosuvastatin (CRESTOR) 20 MG tablet Take 1 tablet (20 mg total) by mouth at bedtime. 90 tablet 1   Vibegron (GEMTESA) 75 MG TABS Take 1 tablet (75 mg total) by mouth daily. 30 tablet 6   No current facility-administered medications on file prior to visit.   Past Medical History:  Diagnosis Date   Allergy  CAD (coronary artery disease)    Depression    Erosive esophagitis    Erosive gastritis    Fibromyalgia    Gallbladder disease    GERD (gastroesophageal reflux disease)    History of Clostridium difficile infection    History of COVID-19    History of myocardial infarction    Hyperlipidemia    Hypertension    Hypertensive urgency    Idiopathic gout    Myocardial infarction (HCC)    2010   Nontoxic multinodular goiter    Other asthma    Vitamin D deficiency    Past Surgical History:  Procedure Laterality Date   ABDOMINAL HYSTERECTOMY     one ovary removed   APPENDECTOMY     CATARACT EXTRACTION  10/2021   CHOLECYSTECTOMY     ROTATOR CUFF REPAIR      Family History  Problem Relation Age of Onset   Cancer Mother     Parkinson's disease Mother    Diabetes Mother    Hypertension Mother    Stroke Father    Hyperlipidemia Sister    Hypertension Sister    Fibromyalgia Sister    Cancer Sister    Alzheimer's disease Maternal Grandmother    Cancer Paternal Grandmother        Leukemia   Breast cancer Neg Hx    Social History   Socioeconomic History   Marital status: Widowed    Spouse name: Not on file   Number of children: 3   Years of education: Not on file   Highest education level: GED or equivalent  Occupational History   Not on file  Tobacco Use   Smoking status: Former    Current packs/day: 0.00    Types: Cigarettes    Quit date: 2002    Years since quitting: 22.8   Smokeless tobacco: Never  Vaping Use   Vaping status: Never Used  Substance and Sexual Activity   Alcohol use: Never   Drug use: Never   Sexual activity: Not Currently  Other Topics Concern   Not on file  Social History Narrative   Not on file   Social Determinants of Health   Financial Resource Strain: Low Risk  (11/06/2022)   Overall Financial Resource Strain (CARDIA)    Difficulty of Paying Living Expenses: Not very hard  Food Insecurity: Food Insecurity Present (11/06/2022)   Hunger Vital Sign    Worried About Running Out of Food in the Last Year: Sometimes true    Ran Out of Food in the Last Year: Never true  Transportation Needs: No Transportation Needs (11/06/2022)   PRAPARE - Administrator, Civil Service (Medical): No    Lack of Transportation (Non-Medical): No  Physical Activity: Inactive (11/06/2022)   Exercise Vital Sign    Days of Exercise per Week: 2 days    Minutes of Exercise per Session: 0 min  Stress: No Stress Concern Present (11/06/2022)   [employer] of Occupational Health - Occupational Stress Questionnaire    Feeling of Stress : Only a little  Social Connections: Moderately Isolated (11/06/2022)   Social Connection and Isolation Panel [NHANES]    Frequency of  Communication with Friends and Family: More than three times a week    Frequency of Social Gatherings with Friends and Family: Twice a week    Attends Religious Services: More than 4 times per year    Active Member of [employer] or Organizations: No    Attends Banker Meetings: Never  Marital Status: Widowed    Objective:  BP 124/64   Pulse 65   Temp (!) 97.2 F (36.2 C)   Ht 5\' 2"  (1.575 m)   Wt 253 lb (114.8 kg)   SpO2 97%   BMI 46.27 kg/m      11/10/2022    8:42 AM 10/15/2022    2:00 PM 10/15/2022    1:59 PM  BP/Weight  Systolic BP 124 158 167  Diastolic BP 64 84 83  Wt. (Lbs) 253    BMI 46.27 kg/m2      Physical Exam Vitals reviewed.  Constitutional:      Appearance: Normal appearance. She is obese.  Neck:     Vascular: No carotid bruit.  Cardiovascular:     Rate and Rhythm: Normal rate and regular rhythm.     Heart sounds: Normal heart sounds.  Pulmonary:     Effort: Pulmonary effort is normal. No respiratory distress.     Breath sounds: Normal breath sounds.  Abdominal:     General: Abdomen is flat. Bowel sounds are normal.     Palpations: Abdomen is soft.     Tenderness: There is no abdominal tenderness.  Neurological:     Mental Status: She is alert and oriented to person, place, and time.  Psychiatric:        Mood and Affect: Mood normal.        Behavior: Behavior normal.     Diabetic Foot Exam - Simple   No data filed      Lab Results  Component Value Date   WBC 8.5 11/10/2022   HGB 14.1 11/10/2022   HCT 43.6 11/10/2022   PLT 191 11/10/2022   GLUCOSE 91 11/10/2022   CHOL 122 11/10/2022   TRIG 183 (H) 11/10/2022   HDL 41 11/10/2022   LDLCALC 51 11/10/2022   ALT 20 11/10/2022   AST 26 11/10/2022   NA 141 11/10/2022   K 4.9 11/10/2022   CL 103 11/10/2022   CREATININE 0.97 11/10/2022   BUN 15 11/10/2022   CO2 24 11/10/2022   TSH 2.170 05/11/2022   HGBA1C 5.6 11/10/2022      Assessment & Plan:    Hypertensive heart  disease without heart failure Assessment & Plan: The current medical regimen is effective;  continue present plan and medications.  Continue edarbi 80 mg daily and toprol xl 25 daily. Has NTG.   Orders: -     Comprehensive metabolic panel  Prediabetes Assessment & Plan: Hemoglobin A1c 5.6%, 3 month avg of blood sugars, is in prediabetic range.  In order to prevent progression to diabetes, recommend low carb diet and regular exercise   Orders: -     CBC with Differential/Platelet -     Hemoglobin A1c  Mixed hyperlipidemia Assessment & Plan: Well controlled.  No changes to medicines. Continue rosuvastastin 20 mg once before bed and fish oil 1000 mg qam  Continue to work on eating a healthy diet and exercise.  Labs drawn today.    Orders: -     Lipid panel  Overactive bladder Assessment & Plan: The current medical regimen is effective;  continue present plan and medications.  Taking Gemtesa 75 mg taking once daily.    GERD without esophagitis Assessment & Plan: The current medical regimen is effective;  continue present plan and medications. Continue Pantoprazole 40 mg take 1 tablet daily.   Atherosclerosis of native coronary artery of native heart with angina pectoris [facility]) Assessment & Plan: Referring to cardiologist  Orders: -     Ambulatory referral to Cardiology     No orders of the defined types were placed in this encounter.   Orders Placed This Encounter  Procedures   CBC with Differential/Platelet   Comprehensive metabolic panel   Lipid panel   Hemoglobin A1c   Ambulatory referral to Cardiology     Follow-up: Return in about 6 months (around 05/11/2023) for chronic follow up.   I,Katherina A Bramblett,acting as a scribe for Blane Ohara, MD.,have documented all relevant documentation on the behalf of Blane Ohara, MD,as directed by  Blane Ohara, MD while in the presence of Blane Ohara, MD.   Clayborn Bigness I Leal-Borjas,acting as a scribe for Blane Ohara, MD.,have documented all relevant documentation on the behalf of Blane Ohara, MD,as directed by  Blane Ohara, MD while in the presence of Blane Ohara, MD.    An After Visit Summary was printed and given to the patient.  I attest that I have reviewed this visit and agree with the plan scribed by my staff.   Blane Ohara, MD [facility] [phone]

## 2022-11-09 NOTE — Assessment & Plan Note (Signed)
The current medical regimen is effective;  continue present plan and medications.  Continue edarbi 80 mg daily and toprol xl 25 daily. Has NTG.

## 2022-11-09 NOTE — Assessment & Plan Note (Signed)
Hemoglobin A1c 5.6%, 3 month avg of blood sugars, is in prediabetic range.  In order to prevent progression to diabetes, recommend low carb diet and regular exercise

## 2022-11-09 NOTE — Assessment & Plan Note (Signed)
Well controlled.  No changes to medicines. Continue rosuvastastin 20 mg once before bed and fish oil 1000 mg qam  Continue to work on eating a healthy diet and exercise.  Labs drawn today.

## 2022-11-09 NOTE — Assessment & Plan Note (Signed)
The current medical regimen is effective;  continue present plan and medications. Continue Pantoprazole 40 mg take 1 tablet daily.

## 2022-11-10 ENCOUNTER — Ambulatory Visit (INDEPENDENT_AMBULATORY_CARE_PROVIDER_SITE_OTHER): Payer: PPO | Admitting: Family Medicine

## 2022-11-10 ENCOUNTER — Encounter: Payer: Self-pay | Admitting: Family Medicine

## 2022-11-10 VITALS — BP 124/64 | HR 65 | Temp 97.2°F | Ht 62.0 in | Wt 253.0 lb

## 2022-11-10 DIAGNOSIS — N3281 Overactive bladder: Secondary | ICD-10-CM

## 2022-11-10 DIAGNOSIS — K219 Gastro-esophageal reflux disease without esophagitis: Secondary | ICD-10-CM

## 2022-11-10 DIAGNOSIS — R7303 Prediabetes: Secondary | ICD-10-CM

## 2022-11-10 DIAGNOSIS — I119 Hypertensive heart disease without heart failure: Secondary | ICD-10-CM

## 2022-11-10 DIAGNOSIS — I25119 Atherosclerotic heart disease of native coronary artery with unspecified angina pectoris: Secondary | ICD-10-CM | POA: Diagnosis not present

## 2022-11-10 DIAGNOSIS — E782 Mixed hyperlipidemia: Secondary | ICD-10-CM

## 2022-11-10 LAB — COMPREHENSIVE METABOLIC PANEL
ALT: 20 [IU]/L (ref 0–32)
AST: 26 [IU]/L (ref 0–40)
Albumin: 4.4 g/dL (ref 3.9–4.9)
Alkaline Phosphatase: 92 [IU]/L (ref 44–121)
BUN/Creatinine Ratio: 15 (ref 12–28)
BUN: 15 mg/dL (ref 8–27)
Bilirubin Total: 0.3 mg/dL (ref 0.0–1.2)
CO2: 24 mmol/L (ref 20–29)
Calcium: 9.8 mg/dL (ref 8.7–10.3)
Chloride: 103 mmol/L (ref 96–106)
Creatinine, Ser: 0.97 mg/dL (ref 0.57–1.00)
Globulin, Total: 2.3 g/dL (ref 1.5–4.5)
Glucose: 91 mg/dL (ref 70–99)
Potassium: 4.9 mmol/L (ref 3.5–5.2)
Sodium: 141 mmol/L (ref 134–144)
Total Protein: 6.7 g/dL (ref 6.0–8.5)
eGFR: 63 mL/min/{1.73_m2} (ref >59)

## 2022-11-10 LAB — HEMOGLOBIN A1C
Est. average glucose Bld gHb Est-mCnc: 114 mg/dL
Hgb A1c MFr Bld: 5.6 % (ref 4.8–5.6)

## 2022-11-10 LAB — LIPID PANEL
Chol/HDL Ratio: 3 {ratio} (ref 0.0–4.4)
Cholesterol, Total: 122 mg/dL (ref 100–199)
HDL: 41 mg/dL (ref >39)
LDL Chol Calc (NIH): 51 mg/dL (ref 0–99)
Triglycerides: 183 mg/dL — ABNORMAL HIGH (ref 0–149)
VLDL Cholesterol Cal: 30 mg/dL (ref 5–40)

## 2022-11-10 LAB — CBC WITH DIFFERENTIAL/PLATELET
Basophils Absolute: 0.1 10*3/uL (ref 0.0–0.2)
Basos: 1 %
EOS (ABSOLUTE): 0.1 10*3/uL (ref 0.0–0.4)
Eos: 1 %
Hematocrit: 43.6 % (ref 34.0–46.6)
Hemoglobin: 14.1 g/dL (ref 11.1–15.9)
Immature Grans (Abs): 0.1 10*3/uL (ref 0.0–0.1)
Immature Granulocytes: 1 %
Lymphocytes Absolute: 2.5 10*3/uL (ref 0.7–3.1)
Lymphs: 30 %
MCH: 31.1 pg (ref 26.6–33.0)
MCHC: 32.3 g/dL (ref 31.5–35.7)
MCV: 96 fL (ref 79–97)
Monocytes Absolute: 0.5 10*3/uL (ref 0.1–0.9)
Monocytes: 6 %
Neutrophils Absolute: 5.3 10*3/uL (ref 1.4–7.0)
Neutrophils: 61 %
Platelets: 191 10*3/uL (ref 150–450)
RBC: 4.53 x10E6/uL (ref 3.77–5.28)
RDW: 13.2 % (ref 11.7–15.4)
WBC: 8.5 10*3/uL (ref 3.4–10.8)

## 2022-11-10 NOTE — Patient Instructions (Addendum)
Recommend Calcium 1200-1500 mg/day preferred in split doses.  Recommend Vitamin D 800 U.

## 2022-11-14 NOTE — Assessment & Plan Note (Signed)
Referring to cardiologist. 

## 2022-11-15 MED ORDER — EDARBI 80 MG PO TABS: 1.0000 | ORAL_TABLET | Freq: Every day | ORAL | 0 refills | Status: DC

## 2022-11-15 MED ORDER — GEMTESA 75 MG PO TABS: 75.0000 mg | ORAL_TABLET | Freq: Every day | ORAL | Status: DC

## 2022-11-16 ENCOUNTER — Other Ambulatory Visit: Payer: Self-pay | Admitting: Pharmacist

## 2022-11-16 ENCOUNTER — Other Ambulatory Visit (HOSPITAL_COMMUNITY): Payer: Self-pay

## 2022-11-16 NOTE — Patient Instructions (Signed)
Brinlee, Thank you for speaking with me today!  Upon review, the Patient [employer] operates based on helping with medicines that have a qualifying diagnosis, and unfortunately there are no open programs that would match for edarbi or gemtesa.  I did check pricing on edarbi 80mg  daily at [facility]:  $100 for a 30 day supply and $200 for a 90 day supply.  So sorry we don't have any better programs for these two medications!   I will probably not schedule another phone call for [place] since we don't have any programs at this time. However, if you have future questions you can reach me at [phone].  Take care, Elmarie Shiley, PharmD, BCPS Clinical Pharmacist [facility]

## 2022-11-16 NOTE — Progress Notes (Signed)
11/16/2022 Name: Cheryl Beard MRN: [id number] DOB: [birth year]  Chief Complaint  Patient presents with   Medication Assistance    Care Coordination  [name] is a 67 y.o. year old female who presented for a telephone visit.   They were referred to the pharmacist by their PCP for assistance in managing medication access.    S/O: - Edarbi $150 for 3 months through [facility], she is wondering if there is anything cheaper - Gemtesa $47 using [facility], she is   A/P: - Screened patient for Medicare Extra help/LIS program, unfortunately exceeds income threshold. - Considered PAN foundation grant option for gemtesa via needymeds search for patient assistance programs, but there is not an open/covered disease fund to match with patients diagnoses. [employer] manufacturer does not offer patient assistance program. Test pricing edarbi 80mg  daily at [facility]: 30 days = $100, 90 days = $200, will inform patient  Follow-up: no future follow up scheduled, will provide contact information for future questions   Lynnda Shields, PharmD, BCPS Clinical Pharmacist [facility]

## 2022-11-20 ENCOUNTER — Other Ambulatory Visit: Payer: Self-pay

## 2022-11-20 ENCOUNTER — Ambulatory Visit: Payer: PPO

## 2022-11-20 VITALS — BP 152/82 | HR 61 | Ht 62.0 in | Wt 251.4 lb

## 2022-11-20 DIAGNOSIS — I119 Hypertensive heart disease without heart failure: Secondary | ICD-10-CM | POA: Diagnosis not present

## 2022-11-20 DIAGNOSIS — I2510 Atherosclerotic heart disease of native coronary artery without angina pectoris: Secondary | ICD-10-CM

## 2022-11-20 DIAGNOSIS — R072 Precordial pain: Secondary | ICD-10-CM | POA: Diagnosis not present

## 2022-11-20 DIAGNOSIS — E782 Mixed hyperlipidemia: Secondary | ICD-10-CM

## 2022-11-20 DIAGNOSIS — R0609 Other forms of dyspnea: Secondary | ICD-10-CM

## 2022-11-20 MED ORDER — AMLODIPINE BESYLATE 2.5 MG PO TABS
2.5000 mg | ORAL_TABLET | Freq: Every day | ORAL | 3 refills | Status: DC
  Filled 2022-11-20: qty 90, 90d supply, fill #0
  Filled 2023-02-13: qty 90, 90d supply, fill #1
  Filled 2023-05-14: qty 90, 90d supply, fill #2
  Filled 2023-08-12: qty 90, 90d supply, fill #3

## 2022-11-20 NOTE — Assessment & Plan Note (Signed)
Good control on recent lipid panel. Continue with rosuvastatin and fish oil at current doses 20 mg and 1000 mg respectively.

## 2022-11-20 NOTE — Assessment & Plan Note (Signed)
Now with atypical symptoms intermittently using sublingual nitroglycerin.  Will further evaluate with CT coronary angiogram An additional dose of metoprolol succinate on the morning of the test to optimize heart rates.

## 2022-11-20 NOTE — Progress Notes (Signed)
Cardiology Consultation:    Date:  11/20/2022   ID:  Cheryl Beard, DOB [birth year], MRN [id number]  PCP:  Blane Ohara, MD  Cardiologist:  Marlyn Corporal Thomasena Vandenheuvel, MD   Referring MD: Blane Ohara, MD   Chest pain   ASSESSMENT AND PLAN:   Cheryl Beard 67 year old with history of hypertension, hyperlipidemia, asthma, prior hysterectomy and cholecystectomy Problem List Items Addressed This Visit     Hypertensive heart disease without heart failure    Suboptimal blood pressure readings reports systolic blood pressure in 140s at home. There could be a competent of whitecoat hypertension at this time. Continue her current medications metoprolol XL 25 mg once daily, azilsartan 80 mg once daily.  Will add amlodipine 2.5 mg once a day, which should help both with her blood pressure and any unspecified anginal symptoms.  Reviewed potential side effects.       Mixed hyperlipidemia - Primary    Good control on recent lipid panel. Continue with rosuvastatin and fish oil at current doses 20 mg and 1000 mg respectively.       Relevant Orders   EKG 12-Lead (Completed)   CAD, reports history of cath in 2010 at [facility], report not available; cardiac PET September 2010 mildly abnormal; stress nuclear imaging July 2016 at [facility] reportedly unremarkable per patient;    Now with atypical symptoms intermittently using sublingual nitroglycerin.  Will further evaluate with CT coronary angiogram An additional dose of metoprolol succinate on the morning of the test to optimize heart rates.        Other Visit Diagnoses     Precordial pain          Return to care in 1 month.  Okay I have   History of Present Illness:    Cheryl Beard is a 67 y.o. female who is being seen today for the evaluation of chest pain at the request of Cox, Kirsten, MD. Pleasant man here for the visit by herself.  Lives at home by herself.  Her husband passed away last year.  Last visit with  cardiologist was August 2016 with Dr. [name] for evaluation of chest pain at which time stress test was negative.  She has history of CAD which she describes as an episode of chest pain in 2010 further evaluated with a cardiac cath at [facility], the consult is not available for my review mentions she never had any stent done. Also reports hypertension, hyperlipidemia, asthma, prior hysterectomy and cholecystectomy.  Now here for evaluation of atypical symptoms that she describes as awareness of chest discomfort and sensation of not getting enough air.  Occurs randomly.  Last instance was about a month ago and she took a dose of sublingual nitroglycerin.  Denies any palpitations, syncopal episodes.  EKG in the clinic today shows sinus rhythm heart rate 61/min, PR interval 192 ms, QTc normal 388 ms.  Inferior Q waves.  Last stress test with nuclear imaging available from 08/22/2014 at [facility] was reportedly unremarkable.  She has a previous stress test from September 2010 with cardiac PET that reported subtle minimally reversible defect involving mid anterolateral and basal anterolateral segments due to motion, however ischemia could not be entirely excluded.  EF was reported normal 65%.  No significant coronary calcifications.  There was mild transient LV dilatation reported of unknown significance.   Recent blood work reviewed hemoglobin A1c 5.6 on 11-10-2022 Lipid panel total cholesterol 122, triglycerides 183, HDL 41, LDL 51 CMP BUN 15, creatinine 0.97,  sodium 141, potassium 4.9 Normal transaminases and alkaline phosphatase CBC unremarkable with hemoglobin 14.1, hematocrit 43.6, platelets 191  Last thyroid panel from April 2024 normal 2.17  Quit smoking over 10 years ago. No alcohol or drug use.  Past Medical History:  Diagnosis Date   Allergy    CAD (coronary artery disease)    Depression    Erosive esophagitis    Erosive gastritis    Fibromyalgia    Gallbladder disease     GERD (gastroesophageal reflux disease)    History of Clostridium difficile infection    History of COVID-19    History of myocardial infarction    Hyperlipidemia    Hypertension    Hypertensive urgency    Idiopathic gout    Myocardial infarction (HCC)    2010   Nontoxic multinodular goiter    Other asthma    Vitamin D deficiency     Past Surgical History:  Procedure Laterality Date   ABDOMINAL HYSTERECTOMY     one ovary removed   APPENDECTOMY     CATARACT EXTRACTION  10/2021   CHOLECYSTECTOMY     ROTATOR CUFF REPAIR      Current Medications: Current Meds  Medication Sig   allopurinol (ZYLOPRIM) 300 MG tablet Take 1 tablet (300 mg total) by mouth daily.   aspirin EC 81 MG tablet Take 81 mg by mouth daily.   cholecalciferol (VITAMIN D3) 25 MCG (1000 UNIT) tablet Take 1,000 Units by mouth daily.   EDARBI 80 MG TABS Take 1 tablet (80 mg total) by mouth daily.   metoprolol succinate (TOPROL-XL) 25 MG 24 hr tablet Take 1 tablet (25 mg total) by mouth daily.   Multiple Vitamins-Minerals (WOMENS MULTIVITAMIN PLUS PO) Take 1 tablet by mouth every morning.   nitroGLYCERIN (NITROSTAT) 0.4 MG SL tablet Dissolve 1 tablet under the tongue every 5 minutes as needed for chest pain. Do not exceed a total of 3 doses in 15 minutes.   Omega-3 Fatty Acids (FISH OIL) 1000 MG CAPS Take 2 capsules by mouth daily. 2 capsules am and 1 capsule pm   rosuvastatin (CRESTOR) 20 MG tablet Take 1 tablet (20 mg total) by mouth at bedtime.   Vibegron (GEMTESA) 75 MG TABS Take 1 tablet (75 mg total) by mouth daily.     Allergies:   Candesartan, Clarithromycin, Colesevelam, Duloxetine, Lovaza [omega-3-acid ethyl esters], and Pregabalin   Social History   Socioeconomic History   Marital status: Widowed    Spouse name: Not on file   Number of children: 3   Years of education: Not on file   Highest education level: GED or equivalent  Occupational History   Not on file  Tobacco Use   Smoking status:  Former    Current packs/day: 0.00    Types: Cigarettes    Quit date: 2002    Years since quitting: 22.8   Smokeless tobacco: Never  Vaping Use   Vaping status: Never Used  Substance and Sexual Activity   Alcohol use: Never   Drug use: Never   Sexual activity: Not Currently  Other Topics Concern   Not on file  Social History Narrative   Not on file   Social Determinants of Health   Financial Resource Strain: Low Risk  (11/06/2022)   Overall Financial Resource Strain (CARDIA)    Difficulty of Paying Living Expenses: Not very hard  Food Insecurity: Food Insecurity Present (11/06/2022)   Hunger Vital Sign    Worried About Running Out of Food in the  Last Year: Sometimes true    Ran Out of Food in the Last Year: Never true  Transportation Needs: No Transportation Needs (11/06/2022)   PRAPARE - Administrator, Civil Service (Medical): No    Lack of Transportation (Non-Medical): No  Physical Activity: Inactive (11/06/2022)   Exercise Vital Sign    Days of Exercise per Week: 2 days    Minutes of Exercise per Session: 0 min  Stress: No Stress Concern Present (11/06/2022)   [employer] of Occupational Health - Occupational Stress Questionnaire    Feeling of Stress : Only a little  Social Connections: Moderately Isolated (11/06/2022)   Social Connection and Isolation Panel [NHANES]    Frequency of Communication with Friends and Family: More than three times a week    Frequency of Social Gatherings with Friends and Family: Twice a week    Attends Religious Services: More than 4 times per year    Active Member of [employer] or Organizations: No    Attends Banker Meetings: Never    Marital Status: Widowed     Family History: The patient's family history includes Alzheimer's disease in her maternal grandmother; Cancer in her mother, paternal grandmother, and sister; Diabetes in her mother; Fibromyalgia in her sister; Hyperlipidemia in her sister; Hypertension  in her mother and sister; Parkinson's disease in her mother; Stroke in her father. There is no history of Breast cancer. ROS:   Please see the history of present illness.    All 14 point review of systems negative except as described per history of present illness.  EKGs/Labs/Other Studies Reviewed:    The following studies were reviewed today:   EKG:  EKG Interpretation Date/Time:  Friday November 20 2022 12:31:18 EDT Ventricular Rate:  61 PR Interval:  192 QRS Duration:  72 QT Interval:  386 QTC Calculation: 388 R Axis:   -1  Text Interpretation: Normal sinus rhythm Inferior infarct , age undetermined Anterior infarct , age undetermined Abnormal ECG No previous ECGs available Confirmed by Huntley Dec reddy [phone]) on 11/20/2022 12:56:39 PM    Recent Labs: 05/11/2022: TSH 2.170 11/10/2022: ALT 20; BUN 15; Creatinine, Ser 0.97; Hemoglobin 14.1; Platelets 191; Potassium 4.9; Sodium 141  Recent Lipid Panel    Component Value Date/Time   CHOL 122 11/10/2022 0957   TRIG 183 (H) 11/10/2022 0957   HDL 41 11/10/2022 0957   CHOLHDL 3.0 11/10/2022 0957   LDLCALC 51 11/10/2022 0957    Physical Exam:    VS:  BP (!) 152/82   Pulse 61   Ht 5\' 2"  (1.575 m)   Wt 251 lb 6.4 oz (114 kg)   SpO2 98%   BMI 45.98 kg/m     Wt Readings from Last 3 Encounters:  11/20/22 251 lb 6.4 oz (114 kg)  11/10/22 253 lb (114.8 kg)  06/16/22 249 lb (112.9 kg)     GENERAL:  Well nourished, well developed in no acute distress NECK: No JVD; No carotid bruits CARDIAC: RRR, S1 and S2 present, no murmurs, no rubs, no gallops CHEST:  Clear to auscultation without rales, wheezing or rhonchi  Extremities: No pitting pedal edema. Pulses bilaterally symmetric with radial 2+ and dorsalis pedis 2+ NEUROLOGIC:  Alert and oriented x 3  Medication Adjustments/Labs and Tests Ordered: Current medicines are reviewed at length with the patient today.  Concerns regarding medicines are outlined above.  Orders  Placed This Encounter  Procedures   EKG 12-Lead   No orders of the defined types  were placed in this encounter.   Signed, Cecille Amsterdam, MD, MPH, [facility]. 11/20/2022 1:23 PM    [facility]

## 2022-11-20 NOTE — Assessment & Plan Note (Addendum)
Suboptimal blood pressure readings reports systolic blood pressure in 140s at home. There could be a competent of whitecoat hypertension at this time. Continue her current medications metoprolol XL 25 mg once daily, azilsartan 80 mg once daily.  Will add amlodipine 2.5 mg once a day, which should help both with her blood pressure and any unspecified anginal symptoms.  Reviewed potential side effects.

## 2022-11-20 NOTE — Patient Instructions (Addendum)
Medication Instructions:   TAKE: 2 Toprol XL tablets morning of CT scan  START: Amlodipine 2.5mg  1 tablet daily   Lab Work: CMP- Completed 12-11-22 If you have labs (blood work) drawn today and your tests are completely normal, you will receive your results only by: MyChart Message (if you have MyChart) OR A paper copy in the mail If you have any lab test that is abnormal or we need to change your treatment, we will call you to review the results.   Testing/Procedures:  Your cardiac CT will be scheduled at one of the below locations:   [facility] [address] [place], [place] [address] [phone]   If scheduled at [facility], please arrive at the [facility] and Children's Entrance (Entrance C2) of [facility] 30 minutes prior to test start time. You can use the FREE valet parking offered at entrance C (encouraged to control the heart rate for the test)  Proceed to the [facility] Radiology Department (first floor) to check-in and test prep.  All radiology patients and guests should use entrance C2 at [facility], accessed from [facility], even though the hospital's physical address listed is [address]     Please follow these instructions carefully (unless otherwise directed):    On the Night Before the Test: Be sure to Drink plenty of water. Do not consume any caffeinated/decaffeinated beverages or chocolate 12 hours prior to your test. Do not take any antihistamines 12 hours prior to your test.  On the Day of the Test: Drink plenty of water until 1 hour prior to the test. Do not eat any food 4 hours prior to the test. You may take your regular medications prior to the test.  Take metoprolol (Lopressor) as directed FEMALES- please wear underwire-free bra if available, avoid dresses & tight clothing       After the Test: Drink plenty of water. After receiving IV contrast, you may experience a mild  flushed feeling. This is normal. On occasion, you may experience a mild rash up to 24 hours after the test. This is not dangerous. If this occurs, you can take Benadryl 25 mg and increase your fluid intake. If you experience trouble breathing, this can be serious. If it is severe call 911 IMMEDIATELY. If it is mild, please call our office. If you take any of these medications: Glipizide/Metformin, Avandament, Glucavance, please do not take 48 hours after completing test unless otherwise instructed.  We will call to schedule your test 2-4 weeks out understanding that some insurance companies will need an authorization prior to the service being performed.   For non-scheduling related questions, please contact the cardiac imaging nurse navigator should you have any questions/concerns: Rockwell Alexandria, Cardiac Imaging Nurse Navigator Larey Brick, Cardiac Imaging Nurse Navigator [facility] Direct Office Dial: [phone]   For scheduling needs, including cancellations and rescheduling, please call [place], [phone].    Follow-Up: At [facility], you and your health needs are our priority.  As part of our continuing mission to provide you with exceptional heart care, we have created designated Provider Care Teams.  These Care Teams include your primary Cardiologist (physician) and Advanced Practice Providers (APPs -  Physician Assistants and Nurse Practitioners) who all work together to provide you with the care you need, when you need it.  We recommend signing up for the patient portal called "MyChart".  Sign up information is provided on this After Visit  Summary.  MyChart is used to connect with patients for Virtual Visits (Telemedicine).  Patients are able to view lab/test results, encounter notes, upcoming appointments, etc.  Non-urgent messages can be sent to your provider as well.   To learn more about what you can do with MyChart, go to  [email or web address].    Your next appointment:   1 month(s)  The format for your next appointment:   In Person  Provider:   Huntley Dec, MD   Other Instructions NA

## 2022-12-10 ENCOUNTER — Ambulatory Visit: Payer: PPO | Admitting: Obstetrics and Gynecology

## 2022-12-10 ENCOUNTER — Telehealth (HOSPITAL_COMMUNITY): Payer: Self-pay | Admitting: *Deleted

## 2022-12-10 NOTE — Telephone Encounter (Signed)
Reaching out to patient to offer assistance regarding upcoming cardiac imaging study; pt verbalizes understanding of appt date/time, parking situation and where to check in, pre-test NPO status and medications ordered, and verified current allergies; name and call back number provided for further questions should they arise Hayley Sharpe RN Navigator Cardiac Imaging [facility] [phone] office [phone] cell  

## 2022-12-11 ENCOUNTER — Ambulatory Visit (HOSPITAL_COMMUNITY)
Admission: RE | Admit: 2022-12-11 | Discharge: 2022-12-11 | Disposition: A | Discharge status: Home / Self Care | Payer: PPO | Source: Ambulatory Visit

## 2022-12-11 DIAGNOSIS — I2510 Atherosclerotic heart disease of native coronary artery without angina pectoris: Secondary | ICD-10-CM | POA: Diagnosis not present

## 2022-12-11 DIAGNOSIS — R072 Precordial pain: Secondary | ICD-10-CM | POA: Insufficient documentation

## 2022-12-11 MED ORDER — NITROGLYCERIN 0.4 MG SL SUBL
SUBLINGUAL_TABLET | SUBLINGUAL | Status: AC
  Filled 2022-12-11: qty 2

## 2022-12-11 MED ORDER — IOHEXOL 350 MG/ML SOLN
100.0000 mL | Freq: Once | INTRAVENOUS | Status: AC | PRN
Start: 2022-12-11 — End: 2022-12-11
  Administered 2022-12-11: 100 mL via INTRAVENOUS

## 2022-12-11 MED ORDER — NITROGLYCERIN 0.4 MG SL SUBL
0.8000 mg | SUBLINGUAL_TABLET | Freq: Once | SUBLINGUAL | Status: AC
Start: 2022-12-11 — End: 2022-12-11
  Administered 2022-12-11: 0.8 mg via SUBLINGUAL

## 2022-12-14 ENCOUNTER — Ambulatory Visit: Payer: PPO

## 2022-12-14 ENCOUNTER — Other Ambulatory Visit: Payer: Self-pay | Admitting: Family Medicine

## 2022-12-14 DIAGNOSIS — R0609 Other forms of dyspnea: Secondary | ICD-10-CM | POA: Diagnosis not present

## 2022-12-14 DIAGNOSIS — Z1231 Encounter for screening mammogram for malignant neoplasm of breast: Secondary | ICD-10-CM

## 2022-12-15 LAB — ECHOCARDIOGRAM COMPLETE: S' Lateral: 2.8 cm

## 2022-12-21 DIAGNOSIS — E559 Vitamin D deficiency, unspecified: Secondary | ICD-10-CM | POA: Insufficient documentation

## 2022-12-21 DIAGNOSIS — J45998 Other asthma: Secondary | ICD-10-CM | POA: Insufficient documentation

## 2022-12-21 DIAGNOSIS — I219 Acute myocardial infarction, unspecified: Secondary | ICD-10-CM | POA: Insufficient documentation

## 2022-12-21 DIAGNOSIS — M1000 Idiopathic gout, unspecified site: Secondary | ICD-10-CM | POA: Insufficient documentation

## 2022-12-21 DIAGNOSIS — Z8616 Personal history of COVID-19: Secondary | ICD-10-CM | POA: Insufficient documentation

## 2022-12-21 DIAGNOSIS — I10 Essential (primary) hypertension: Secondary | ICD-10-CM | POA: Insufficient documentation

## 2022-12-21 DIAGNOSIS — I252 Old myocardial infarction: Secondary | ICD-10-CM | POA: Insufficient documentation

## 2022-12-21 DIAGNOSIS — K829 Disease of gallbladder, unspecified: Secondary | ICD-10-CM | POA: Insufficient documentation

## 2022-12-21 DIAGNOSIS — E785 Hyperlipidemia, unspecified: Secondary | ICD-10-CM | POA: Insufficient documentation

## 2022-12-21 DIAGNOSIS — I160 Hypertensive urgency: Secondary | ICD-10-CM | POA: Insufficient documentation

## 2022-12-21 DIAGNOSIS — T7840XA Allergy, unspecified, initial encounter: Secondary | ICD-10-CM | POA: Insufficient documentation

## 2022-12-21 DIAGNOSIS — K219 Gastro-esophageal reflux disease without esophagitis: Secondary | ICD-10-CM | POA: Insufficient documentation

## 2022-12-21 DIAGNOSIS — Z8619 Personal history of other infectious and parasitic diseases: Secondary | ICD-10-CM | POA: Insufficient documentation

## 2022-12-21 DIAGNOSIS — K2960 Other gastritis without bleeding: Secondary | ICD-10-CM | POA: Insufficient documentation

## 2022-12-21 DIAGNOSIS — K2210 Ulcer of esophagus without bleeding: Secondary | ICD-10-CM | POA: Insufficient documentation

## 2022-12-21 DIAGNOSIS — F32A Depression, unspecified: Secondary | ICD-10-CM | POA: Insufficient documentation

## 2022-12-22 ENCOUNTER — Ambulatory Visit: Payer: PPO

## 2022-12-22 VITALS — BP 132/73 | HR 70 | Ht 62.0 in | Wt 253.0 lb

## 2022-12-22 DIAGNOSIS — E782 Mixed hyperlipidemia: Secondary | ICD-10-CM | POA: Diagnosis not present

## 2022-12-22 DIAGNOSIS — I2510 Atherosclerotic heart disease of native coronary artery without angina pectoris: Secondary | ICD-10-CM

## 2022-12-22 DIAGNOSIS — I10 Essential (primary) hypertension: Secondary | ICD-10-CM | POA: Diagnosis not present

## 2022-12-22 NOTE — Assessment & Plan Note (Signed)
Continue aspirin 81 mg once daily Continue rosuvastatin Healthy dietary lifestyle habits recommended.

## 2022-12-22 NOTE — Progress Notes (Signed)
Cardiology Consultation:    Date:  12/22/2022   ID:  Cheryl Beard, DOB 08-12-1955, MRN [id number]  PCP:  Blane Ohara, MD  Cardiologist:  Marlyn Corporal Taylin Leder, MD   Referring MD: Blane Ohara, MD   Chief Complaint  Patient presents with   Follow-up     ASSESSMENT AND PLAN:   Cheryl Beard 67 year old woman mild nonobstructive coronary artery disease, hypertension, hyperlipidemia, former smoker, asthma, prior hysterectomy and cholecystectomy here for follow-up after recent echocardiogram, CT coronary angiogram.  Problem List Items Addressed This Visit     Mixed hyperlipidemia    Continue with rosuvastatin 20 mg once daily and fish oil 1000 mg once daily Target LDL below 70 mg/dL. Last lipid panel from October well-controlled.      CAD, reports history of cath in 2010 at [facility], cardiac PET Septem09/2010 mildly abnormal; stress nuclear imaging 07/2014 at [facility] unremarkable per pt; CT Cors Ca score 202, Mild LAD 11/2022 - Primary    Continue aspirin 81 mg once daily Continue rosuvastatin Healthy dietary lifestyle habits recommended.       Hypertension    Well-controlled.  Continue current medications amlodipine 2.5 mg once daily Continue azilsartan 80 mg once daily Continue metoprolol XL 25 mg once daily Target below 130 over 80 mmHg.       Return to clinic in 1 year or as needed basis   History of Present Illness:    Cheryl Beard is a 67 y.o. female who is being seen today for follow-up visit. Her last visit with me was 11-20-2022. PCP is Cox, Kirsten, MD.   Has history of mild nonobstructive coronary artery disease [prior cath reported by patient at [facility] in 2010 ; CT coronary angiogram done 12/11/2022 with calcium score 202 involving left main/LAD, CAD RADS 2 disease with mild ostial stenosis of LAD], hypertension, hyperlipidemia, Former smoker [quit  in 2002], asthma, prior hysterectomy and cholecystectomy  Pleasant woman here for the visit  accompanied by her friend  Doing well.  No active complaints at this time.  Blood pressures have been well-controlled.  Ranging between 110s to 120s systolic at home.  Denies any side effects.  Tolerating her current medications well.  No side effects.  Echocardiogram done today 12/14/2022 normal biventricular function LVEF 60 to 65%, normal RV function.  No significant valve abnormalities.  Lipid panel total cholesterol 122, triglycerides 183, HDL 41, LDL 51   Past Medical History:  Diagnosis Date   Allergy    CAD (coronary artery disease)    Chronic idiopathic gout involving toe of left foot without tophus 05/10/2019   Depression    Erosive esophagitis    Erosive gastritis    Fibromyalgia    Gallbladder disease    GERD (gastroesophageal reflux disease)    GERD without esophagitis 05/10/2019   History of Clostridium difficile infection    History of COVID-19    History of myocardial infarction    Hyperlipidemia    Hypertension    Hypertensive heart disease without heart failure 05/10/2019   Hypertensive urgency    Idiopathic gout    Left hip pain 01/23/2021   Lumbar back pain 01/23/2021   Mixed hyperlipidemia 08/14/2014   Morbid obesity (HCC) 05/14/2019   Morbid obesity with BMI of 45.0-49.9, adult (HCC) 05/14/2019   Myocardial infarction (HCC)    2010   Nontoxic multinodular goiter    Osteopenia 12/13/2019   Other asthma    Overactive bladder 05/11/2022   Prediabetes 05/05/2021   Telogen  effluvium 05/10/2019   Uncomplicated asthma 08/10/2019   Vitamin D deficiency     Past Surgical History:  Procedure Laterality Date   ABDOMINAL HYSTERECTOMY     one ovary removed   APPENDECTOMY     CATARACT EXTRACTION  10/2021   CHOLECYSTECTOMY     ROTATOR CUFF REPAIR      Current Medications: Current Meds  Medication Sig   allopurinol (ZYLOPRIM) 300 MG tablet Take 1 tablet (300 mg total) by mouth daily.   amLODipine (NORVASC) 2.5 MG tablet Take 1 tablet (2.5 mg total)  by mouth daily.   aspirin EC 81 MG tablet Take 81 mg by mouth daily.   CALCIUM PO Take 600 mg by mouth 2 (two) times daily.   cholecalciferol (VITAMIN D3) 25 MCG (1000 UNIT) tablet Take 1,000 Units by mouth daily.   EDARBI 80 MG TABS Take 1 tablet (80 mg total) by mouth daily.   metoprolol succinate (TOPROL-XL) 25 MG 24 hr tablet Take 1 tablet (25 mg total) by mouth daily.   Multiple Vitamins-Minerals (WOMENS MULTIVITAMIN PLUS PO) Take 1 tablet by mouth every morning.   nitroGLYCERIN (NITROSTAT) 0.4 MG SL tablet Dissolve 1 tablet under the tongue every 5 minutes as needed for chest pain. Do not exceed a total of 3 doses in 15 minutes. (Patient taking differently: Place 0.4 mg under the tongue every 5 (five) minutes as needed for chest pain.)   Omega-3 Fatty Acids (FISH OIL) 1000 MG CAPS Take 2 capsules by mouth daily. 2 capsules am and 1 capsule pm   rosuvastatin (CRESTOR) 20 MG tablet Take 1 tablet (20 mg total) by mouth at bedtime.   Vibegron (GEMTESA) 75 MG TABS Take 1 tablet (75 mg total) by mouth daily.     Allergies:   Candesartan, Clarithromycin, Colesevelam, Duloxetine, Lovaza [omega-3-acid ethyl esters (fish)], and Pregabalin   Social History   Socioeconomic History   Marital status: Widowed    Spouse name: Not on file   Number of children: 3   Years of education: Not on file   Highest education level: GED or equivalent  Occupational History   Not on file  Tobacco Use   Smoking status: Former    Current packs/day: 0.00    Types: Cigarettes    Quit date: 2002    Years since quitting: 22.9   Smokeless tobacco: Never  Vaping Use   Vaping status: Never Used  Substance and Sexual Activity   Alcohol use: Never   Drug use: Never   Sexual activity: Not Currently  Other Topics Concern   Not on file  Social History Narrative   Not on file   Social Determinants of Health   Financial Resource Strain: Low Risk  (11/06/2022)   Overall Financial Resource Strain (CARDIA)     Difficulty of Paying Living Expenses: Not very hard  Food Insecurity: Food Insecurity Present (11/06/2022)   Hunger Vital Sign    Worried About Running Out of Food in the Last Year: Sometimes true    Ran Out of Food in the Last Year: Never true  Transportation Needs: No Transportation Needs (11/06/2022)   PRAPARE - Administrator, Civil Service (Medical): No    Lack of Transportation (Non-Medical): No  Physical Activity: Inactive (11/06/2022)   Exercise Vital Sign    Days of Exercise per Week: 2 days    Minutes of Exercise per Session: 0 min  Stress: No Stress Concern Present (11/06/2022)   [employer] of Occupational Health - Occupational  Stress Questionnaire    Feeling of Stress : Only a little  Social Connections: Moderately Isolated (11/06/2022)   Social Connection and Isolation Panel [NHANES]    Frequency of Communication with Friends and Family: More than three times a week    Frequency of Social Gatherings with Friends and Family: Twice a week    Attends Religious Services: More than 4 times per year    Active Member of [employer] or Organizations: No    Attends Banker Meetings: Never    Marital Status: Widowed     Family History: The patient's family history includes Alzheimer's disease in her maternal grandmother; Cancer in her mother, paternal grandmother, and sister; Diabetes in her mother; Fibromyalgia in her sister; Hyperlipidemia in her sister; Hypertension in her mother and sister; Parkinson's disease in her mother; Stroke in her father. There is no history of Breast cancer. ROS:   Please see the history of present illness.    All 14 point review of systems negative except as described per history of present illness.  EKGs/Labs/Other Studies Reviewed:    The following studies were reviewed today:   EKG:       Recent Labs: 05/11/2022: TSH 2.170 11/10/2022: ALT 20; BUN 15; Creatinine, Ser 0.97; Hemoglobin 14.1; Platelets 191; Potassium  4.9; Sodium 141  Recent Lipid Panel    Component Value Date/Time   CHOL 122 11/10/2022 0957   TRIG 183 (H) 11/10/2022 0957   HDL 41 11/10/2022 0957   CHOLHDL 3.0 11/10/2022 0957   LDLCALC 51 11/10/2022 0957    Physical Exam:    VS:  BP 132/73 (BP Location: Right Arm, Patient Position: Sitting, Cuff Size: Normal)   Pulse 70   Ht 5\' 2"  (1.575 m)   Wt 253 lb (114.8 kg)   SpO2 97%   BMI 46.27 kg/m     Wt Readings from Last 3 Encounters:  12/22/22 253 lb (114.8 kg)  11/20/22 251 lb 6.4 oz (114 kg)  11/10/22 253 lb (114.8 kg)     GENERAL:  Well nourished, well developed in no acute distress NECK: No JVD; No carotid bruits CARDIAC: RRR, S1 and S2 present, no murmurs, no rubs, no gallops CHEST:  Clear to auscultation without rales, wheezing or rhonchi  Extremities: No pitting pedal edema. Pulses bilaterally symmetric with radial 2+ and dorsalis pedis 2+ NEUROLOGIC:  Alert and oriented x 3  Medication Adjustments/Labs and Tests Ordered: Current medicines are reviewed at length with the patient today.  Concerns regarding medicines are outlined above.  No orders of the defined types were placed in this encounter.  No orders of the defined types were placed in this encounter.   Signed, Cecille Amsterdam, MD, MPH, [facility]. 12/22/2022 8:53 AM    [facility]

## 2022-12-22 NOTE — Patient Instructions (Signed)
Medication Instructions:   Your physician recommends that you continue on your current medications as directed. Please refer to the Current Medication list given to you today.  *If you need a refill on your cardiac medications before your next appointment, please call your pharmacy*   Lab Work: None ordered    Testing/Procedures:  None ordered  Follow-Up: At [facility], you and your health needs are our priority.  As part of our continuing mission to provide you with exceptional heart care, we have created designated Provider Care Teams.  These Care Teams include your primary Cardiologist (physician) and Advanced Practice Providers (APPs -  Physician Assistants and Nurse Practitioners) who all work together to provide you with the care you need, when you need it.  We recommend signing up for the patient portal called "MyChart".  Sign up information is provided on this After Visit Summary.  MyChart is used to connect with patients for Virtual Visits (Telemedicine).  Patients are able to view lab/test results, encounter notes, upcoming appointments, etc.  Non-urgent messages can be sent to your provider as well.   To learn more about what you can do with MyChart, go to [email or web address].    Your next appointment:   12 month(s)  Provider:   Huntley Dec, MD

## 2022-12-22 NOTE — Assessment & Plan Note (Signed)
Continue with rosuvastatin 20 mg once daily and fish oil 1000 mg once daily Target LDL below 70 mg/dL. Last lipid panel from October well-controlled.

## 2022-12-22 NOTE — Assessment & Plan Note (Signed)
Well-controlled.  Continue current medications amlodipine 2.5 mg once daily Continue azilsartan 80 mg once daily Continue metoprolol XL 25 mg once daily Target below 130 over 80 mmHg.

## 2022-12-25 ENCOUNTER — Other Ambulatory Visit: Payer: Self-pay

## 2022-12-28 ENCOUNTER — Other Ambulatory Visit: Payer: Self-pay

## 2022-12-28 NOTE — Telephone Encounter (Signed)
Copied from CRM [id number]. Topic: Clinical - Medication Refill >> Dec 28, 2022 10:01 AM Dondra Prader A wrote: Most Recent Primary Care Visit:  Provider: [facility], KIRSTEN  Department: [facility]  Visit Type: OFFICE VISIT  Date: 11/10/2022  Medication: EDARBI 80 MG TABS  Has the patient contacted their pharmacy? No - [facility] (Agent: If no, request that the patient contact the pharmacy for the refill. If patient does not wish to contact the pharmacy document the reason why and proceed with request.) (Agent: If yes, when and what did the pharmacy advise?)  Is this the correct pharmacy for this prescription? No If no, delete pharmacy and type the correct one.  This is the patient's preferred pharmacy:  [facility] [address] [place] [address] Phone: [phone] Fax: [phone]   Has the prescription been filled recently? No  Is the patient out of the medication? No  Has the patient been seen for an appointment in the last year OR does the patient have an upcoming appointment? Yes  Can we respond through MyChart? Yes  Agent: Please be advised that Rx refills may take up to 3 business days. We ask that you follow-up with your pharmacy.

## 2023-01-04 NOTE — Telephone Encounter (Addendum)
Contacted the patient and she no longer is needing a refill for Edarbi, she states that she found a paper with the name of the pharmacy on it and the pharmacy name is [facility]. She states that she did contact them and they sent her out 3 bottles worth of the medication. Patient states she is fine for another 3-6 months and will give [place] a call back when she needs another refill sent in.

## 2023-01-05 ENCOUNTER — Other Ambulatory Visit: Payer: Self-pay

## 2023-01-05 ENCOUNTER — Telehealth: Payer: Self-pay

## 2023-01-05 MED ORDER — EDARBI 80 MG PO TABS: 1.0000 | ORAL_TABLET | Freq: Every day | ORAL | 1 refills | Status: DC

## 2023-01-05 NOTE — Telephone Encounter (Signed)
Patient made aware, medication was sent to requested pharmacy

## 2023-01-05 NOTE — Telephone Encounter (Signed)
Copied from CRM [id number]. Topic: Clinical - Prescription Issue >> Jan 04, 2023 11:04 AM Dennison Nancy wrote: Reason for CRM: Patient request to cancel the [facility]ill [facility] she gave the wrong  . The correct pharmacy for the refill is the [facility]acy to get the refill this is the only pharmacy she uses to get the  [facility] 80 MG TABS patient already called the pharmacy called the [facility] and they have the medication there and will be mailing out tomorrow 01/05/23 . [facility] phone #  [phone]

## 2023-01-06 ENCOUNTER — Ambulatory Visit
Admission: RE | Admit: 2023-01-06 | Discharge: 2023-01-06 | Disposition: A | Discharge status: Home / Self Care | Payer: PPO | Source: Ambulatory Visit | Attending: Family Medicine | Admitting: Family Medicine

## 2023-01-06 DIAGNOSIS — Z1231 Encounter for screening mammogram for malignant neoplasm of breast: Secondary | ICD-10-CM

## 2023-01-07 ENCOUNTER — Ambulatory Visit (INDEPENDENT_AMBULATORY_CARE_PROVIDER_SITE_OTHER): Payer: PPO | Admitting: Obstetrics and Gynecology

## 2023-01-07 ENCOUNTER — Encounter: Payer: Self-pay | Admitting: Obstetrics and Gynecology

## 2023-01-07 VITALS — BP 124/73 | HR 80

## 2023-01-07 DIAGNOSIS — N952 Postmenopausal atrophic vaginitis: Secondary | ICD-10-CM

## 2023-01-07 DIAGNOSIS — N812 Incomplete uterovaginal prolapse: Secondary | ICD-10-CM | POA: Diagnosis not present

## 2023-01-07 DIAGNOSIS — N816 Rectocele: Secondary | ICD-10-CM

## 2023-01-07 DIAGNOSIS — N393 Stress incontinence (female) (male): Secondary | ICD-10-CM

## 2023-01-07 DIAGNOSIS — N993 Prolapse of vaginal vault after hysterectomy: Secondary | ICD-10-CM

## 2023-01-07 DIAGNOSIS — N8110 Cystocele, unspecified: Secondary | ICD-10-CM

## 2023-01-07 NOTE — Progress Notes (Signed)
[facility]   Subjective:     Chief Complaint:  Chief Complaint  Patient presents with   Pessary Check    Cheryl Beard is a 67 y.o. female is here for 3 month pessary check.   History of Present Illness: Cheryl Beard is a 67 y.o. female with stage II pelvic organ prolapse who presents for a pessary check. She is using a size #5 short stem gellhorn pessary. The pessary came out recently with a hard bowel movement. She is not using vaginal estrogen but is using coconut oil.   While talking to patient and she said that she has been wearing liners in her underwear because she leaks at times.  She describes her leakage is occurring when she coughs, sneezes, or laughs.  The Leslye Peer seems to be working well for the urge incontinence but her stress incontinence is becoming more bothersome to her.  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 tophus (05/10/2019), Depression, Erosive esophagitis, Erosive gastritis, Fibromyalgia, Gallbladder disease, GERD (gastroesophageal reflux disease), GERD without esophagitis (05/10/2019), History of Clostridium difficile infection, History of COVID-19, History of myocardial infarction, Hyperlipidemia, Hypertension, Hypertensive heart disease without heart failure (05/10/2019), Hypertensive urgency, Idiopathic gout, Left hip pain (01/23/2021), Lumbar back pain (01/23/2021), Mixed hyperlipidemia (08/14/2014), Morbid obesity (HCC) (05/14/2019), Morbid obesity with BMI of 45.0-49.9, adult (HCC) (05/14/2019), Myocardial infarction (HCC), Nontoxic multinodular goiter, Osteopenia (12/13/2019), Other asthma, Overactive bladder (05/11/2022), Prediabetes (05/05/2021), Telogen effluvium (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   Past Surgical History: She  has a past surgical history that includes  Abdominal hysterectomy; Cholecystectomy; Appendectomy; Rotator cuff repair; and Cataract extraction (10/2021).   Medications: She has a current medication list which includes the following prescription(s): allopurinol, amlodipine, aspirin ec, calcium, cholecalciferol, edarbi, metoprolol succinate, multiple vitamins-minerals, nitroglycerin, fish oil, rosuvastatin, and gemtesa.   Allergies: Patient is allergic to candesartan, clarithromycin, colesevelam, duloxetine, lovaza [omega-3-acid ethyl esters (fish)], and pregabalin.   Social History: Patient  reports that she quit smoking about 22 years ago. Her smoking use included cigarettes. She has never used smokeless tobacco. She reports that she does not drink alcohol and does not use drugs.      Objective:    Physical Exam: BP 124/73   Pulse 80  Gen: No apparent distress, A&O x 3. Detailed Urogynecologic Evaluation:  Pelvic Exam: Normal external female genitalia; Bartholin's and Skene's glands normal in appearance; urethral meatus normal in appearance, no urethral masses or discharge. The pessary was noted to be dislodged. It was removed and cleaned. Speculum exam revealed no lesions in the vagina. The pessary was replaced. It was comfortable to the patient and fit well.    Assessment/Plan:    Assessment: Cheryl Beard is a 67 y.o. with stage II pelvic organ prolapse here for a pessary check. She is doing well.  Plan: She will keep the pessary in place until next visit. She will continue to use lubricant. She will follow-up in 4 months for a pessary check or sooner as needed.   We briefly discussed options for her stress urinary incontinence including urethral bulking that could be done in the office.  Patient reports she will consider this option and she was given a pamphlet from [employer] as well as information on urethral bulking procedure.  Her most recent A1c was 5.6 which is within normal range for [place] to do an office procedure.  I  think this would be a better option for patient over a sling at this time.  Patient to call back if she decides she wants to do urethral bulking.  All questions were answered.

## 2023-01-10 ENCOUNTER — Encounter: Payer: Self-pay | Admitting: Family Medicine

## 2023-01-11 ENCOUNTER — Other Ambulatory Visit: Payer: Self-pay | Admitting: Family Medicine

## 2023-01-11 MED ORDER — GEMTESA 75 MG PO TABS: 75.0000 mg | ORAL_TABLET | Freq: Every day | ORAL | 2 refills | Status: DC

## 2023-01-22 ENCOUNTER — Other Ambulatory Visit (HOSPITAL_COMMUNITY): Payer: Self-pay

## 2023-01-22 ENCOUNTER — Other Ambulatory Visit: Payer: Self-pay | Admitting: Obstetrics and Gynecology

## 2023-01-25 ENCOUNTER — Other Ambulatory Visit: Payer: Self-pay

## 2023-01-25 MED ORDER — GEMTESA 75 MG PO TABS
75.0000 mg | ORAL_TABLET | Freq: Every day | ORAL | 6 refills | Status: DC
  Filled 2023-01-25: qty 30, 30d supply, fill #0
  Filled 2023-02-20 – 2023-04-16 (×2): qty 30, 30d supply, fill #1
  Filled 2023-05-14: qty 30, 30d supply, fill #2
  Filled 2023-06-13: qty 30, 30d supply, fill #3
  Filled 2023-07-13: qty 30, 30d supply, fill #4
  Filled 2023-08-09: qty 30, 30d supply, fill #5
  Filled 2023-09-08 (×2): qty 30, 30d supply, fill #6

## 2023-01-26 ENCOUNTER — Other Ambulatory Visit: Payer: Self-pay

## 2023-01-26 ENCOUNTER — Other Ambulatory Visit (HOSPITAL_COMMUNITY): Payer: Self-pay

## 2023-01-26 DIAGNOSIS — H353131 Nonexudative age-related macular degeneration, bilateral, early dry stage: Secondary | ICD-10-CM | POA: Diagnosis not present

## 2023-02-13 ENCOUNTER — Other Ambulatory Visit (HOSPITAL_COMMUNITY): Payer: Self-pay

## 2023-02-21 ENCOUNTER — Other Ambulatory Visit (HOSPITAL_COMMUNITY): Payer: Self-pay

## 2023-02-22 ENCOUNTER — Other Ambulatory Visit (HOSPITAL_COMMUNITY): Payer: Self-pay

## 2023-02-22 ENCOUNTER — Other Ambulatory Visit: Payer: Self-pay

## 2023-02-25 ENCOUNTER — Other Ambulatory Visit: Payer: Self-pay

## 2023-03-09 NOTE — Progress Notes (Signed)
PA request was faxed to [facility] for procedure : Urethal Bulking CPT code: 16109 and [phone] PA is needed for this procedure. PA request was: Pending

## 2023-03-17 IMAGING — MG MM DIGITAL SCREENING BILAT W/ TOMO AND CAD
6 of 10 series · 6 of 30 positions shown · non-contrast
Comparison: Previous exam(s).

CLINICAL DATA: Screening.

EXAM:
DIGITAL SCREENING BILATERAL MAMMOGRAM WITH TOMOSYNTHESIS AND CAD
TECHNIQUE: Bilateral screening digital craniocaudal and mediolateral oblique
mammograms were obtained. Bilateral screening digital breast
tomosynthesis was performed. The images were evaluated with
computer-aided detection.

[R MLO synth-2D (1 of 2)]
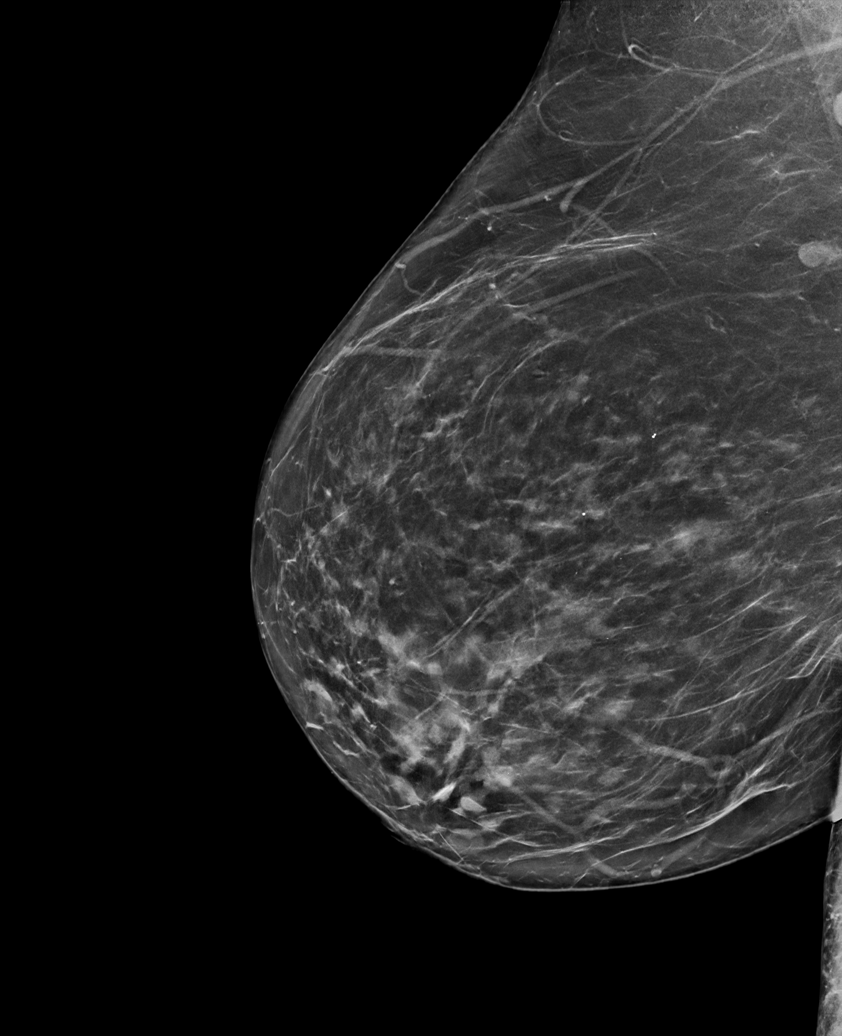

[R MLO synth-2D (2 of 2)]
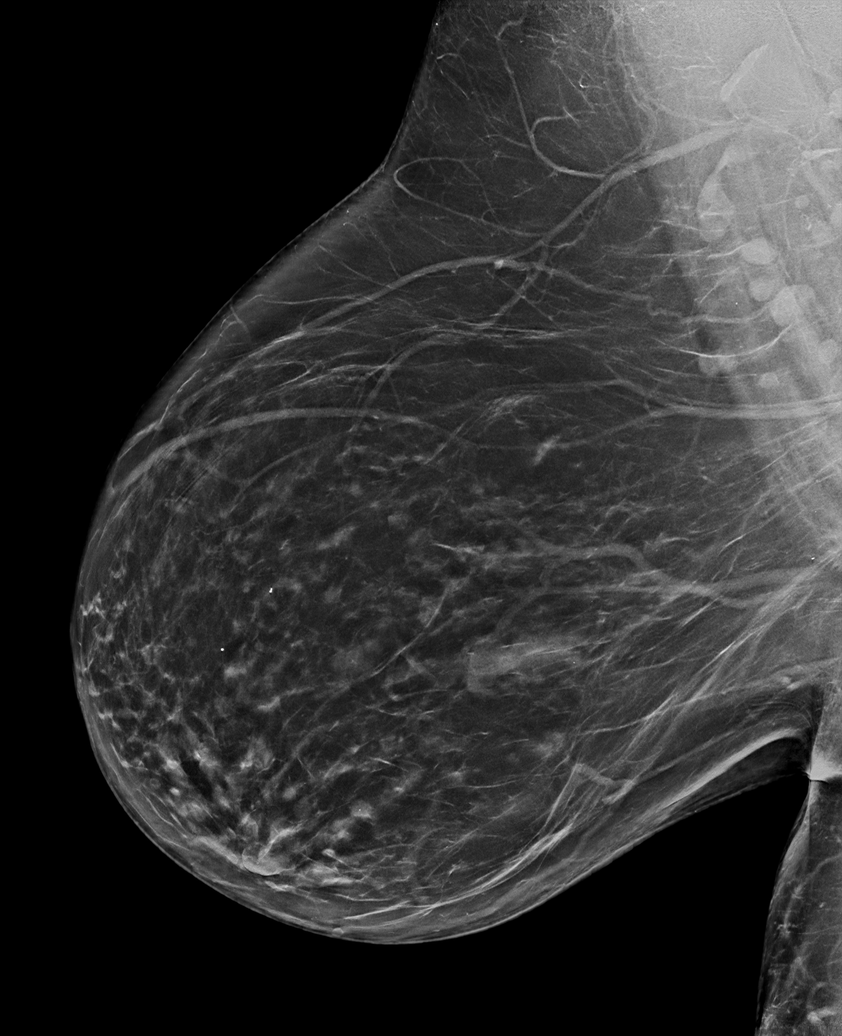

[L CC synth-2D]
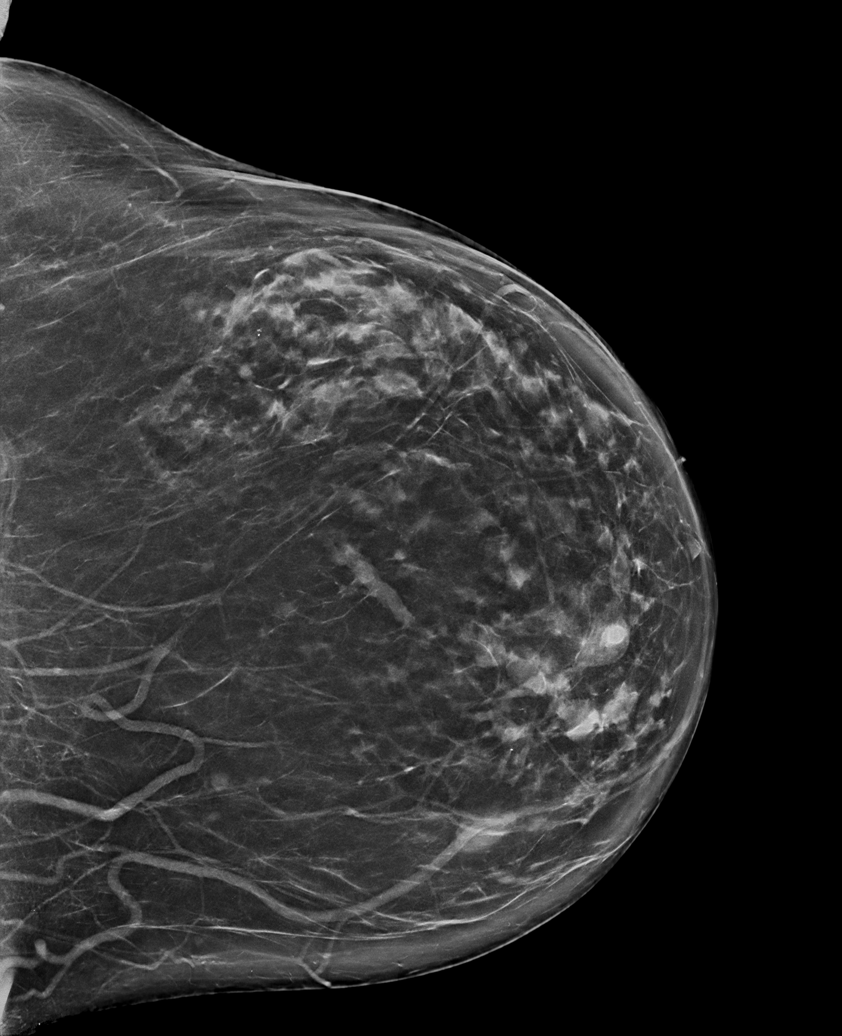

[L MLO synth-2D]
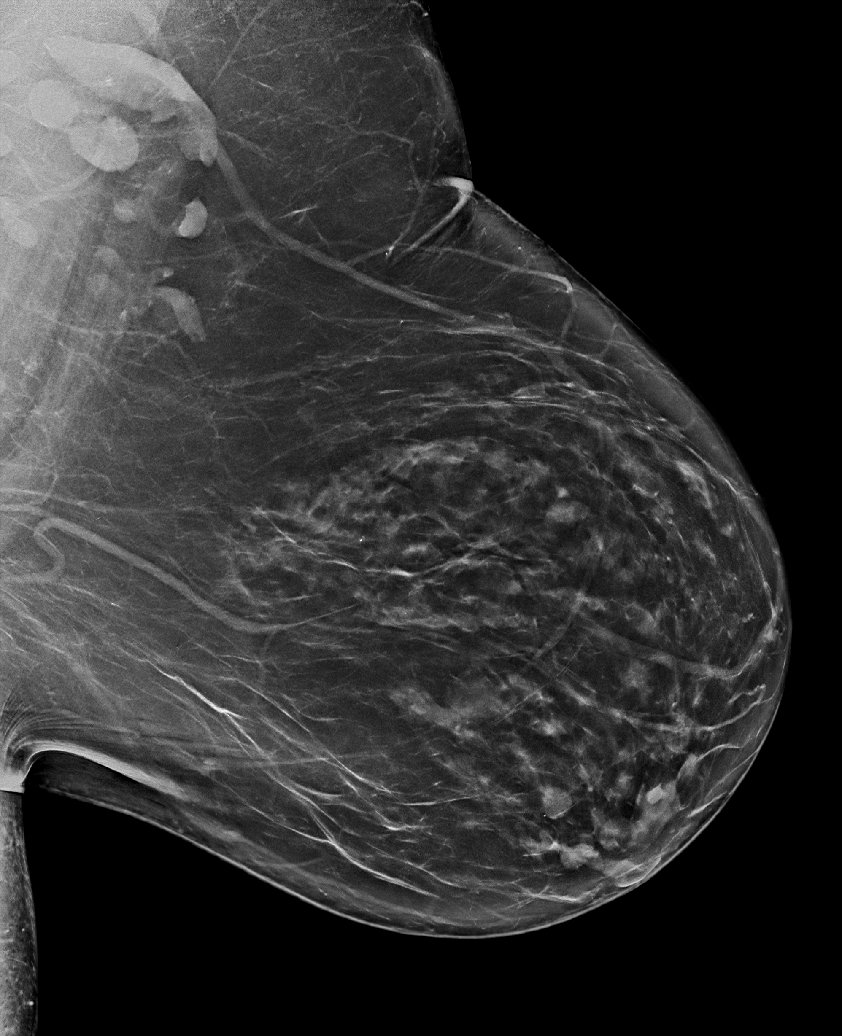

[R CC synth-2D]
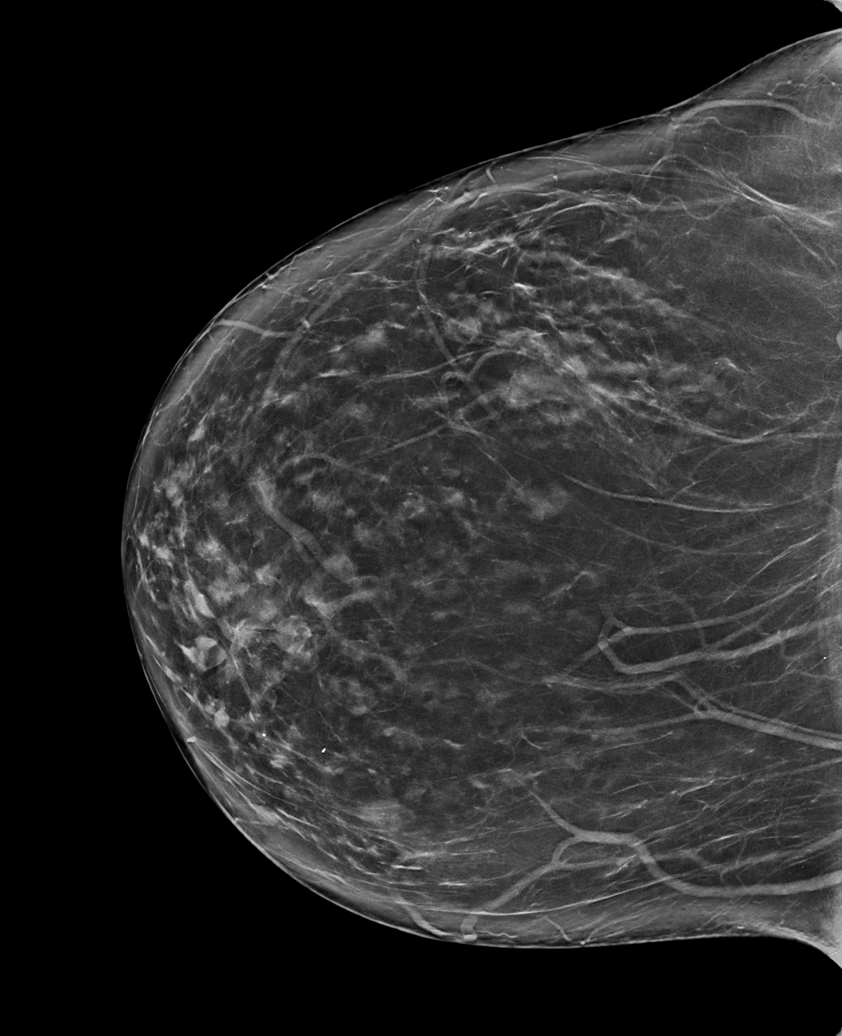

[R MLO tomo · tomo slice 43/85.0]
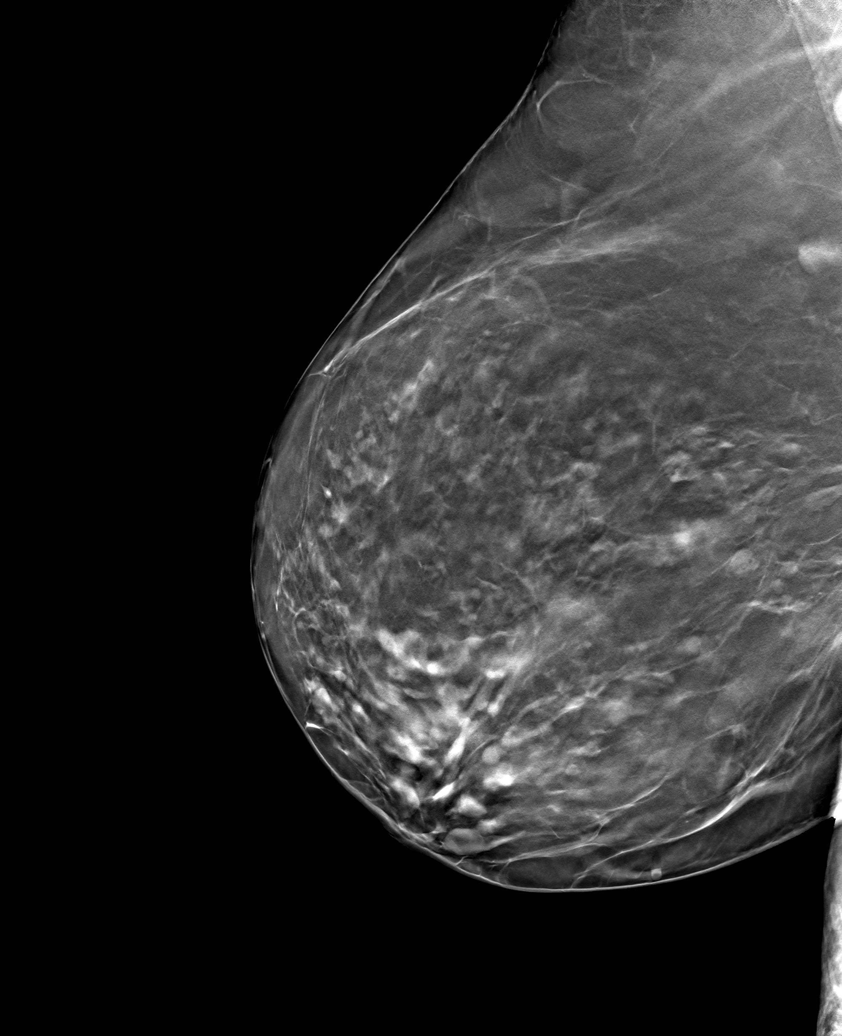

[6 of 30 positions shown; findings below may reference images not displayed]

ACR Breast Density Category b: There are scattered areas of
fibroglandular density.
FINDINGS: There are no findings suspicious for malignancy.
IMPRESSION: No mammographic evidence of malignancy. A result letter of this
screening mammogram will be mailed directly to the patient.

RECOMMENDATION:
Screening mammogram in one year. (Code:51-O-LD2)

BI-RADS CATEGORY  1: Negative.

## 2023-03-22 ENCOUNTER — Other Ambulatory Visit: Payer: Self-pay | Admitting: Family Medicine

## 2023-03-22 ENCOUNTER — Other Ambulatory Visit (HOSPITAL_COMMUNITY): Payer: Self-pay

## 2023-03-22 ENCOUNTER — Other Ambulatory Visit: Payer: Self-pay

## 2023-03-22 MED ORDER — METOPROLOL SUCCINATE ER 25 MG PO TB24
25.0000 mg | ORAL_TABLET | Freq: Every day | ORAL | 1 refills | Status: DC
  Filled 2023-03-22: qty 90, 90d supply, fill #0
  Filled 2023-06-18: qty 90, 90d supply, fill #1

## 2023-03-22 MED ORDER — ROSUVASTATIN CALCIUM 20 MG PO TABS
20.0000 mg | ORAL_TABLET | Freq: Every day | ORAL | 1 refills | Status: DC
  Filled 2023-03-22: qty 90, 90d supply, fill #0
  Filled 2023-06-18: qty 90, 90d supply, fill #1

## 2023-03-22 MED ORDER — ALLOPURINOL 300 MG PO TABS
300.0000 mg | ORAL_TABLET | Freq: Every day | ORAL | 1 refills | Status: DC
  Filled 2023-03-22: qty 90, 90d supply, fill #0
  Filled 2023-06-18: qty 90, 90d supply, fill #1

## 2023-03-24 ENCOUNTER — Encounter: Payer: Self-pay | Admitting: *Deleted

## 2023-03-24 NOTE — Progress Notes (Signed)
 Urethral Bulking PA Approved 30/20/25-07/14/23 Apt 04/15/23 in the office Auth # [id number]   KD CMA

## 2023-04-15 ENCOUNTER — Encounter: Payer: Self-pay | Admitting: Obstetrics and Gynecology

## 2023-04-15 ENCOUNTER — Ambulatory Visit: Payer: PPO | Admitting: Obstetrics and Gynecology

## 2023-04-15 VITALS — BP 123/72 | HR 60

## 2023-04-15 DIAGNOSIS — N3281 Overactive bladder: Secondary | ICD-10-CM

## 2023-04-15 DIAGNOSIS — R350 Frequency of micturition: Secondary | ICD-10-CM

## 2023-04-15 DIAGNOSIS — N393 Stress incontinence (female) (male): Secondary | ICD-10-CM | POA: Insufficient documentation

## 2023-04-15 LAB — POCT URINALYSIS DIPSTICK
Bilirubin, UA: NEGATIVE
Glucose, UA: NEGATIVE
Ketones, UA: NEGATIVE
Leukocytes, UA: NEGATIVE
Nitrite, UA: NEGATIVE
Protein, UA: NEGATIVE
Spec Grav, UA: <=1.005 — AB (ref 1.010–1.025)
Urobilinogen, UA: 0.2 U/dL
pH, UA: 5.5 (ref 5.0–8.0)

## 2023-04-15 MED ORDER — CIPROFLOXACIN HCL 500 MG PO TABS
500.0000 mg | ORAL_TABLET | Freq: Once | ORAL | Status: AC
  Administered 2023-04-15: 500 mg via ORAL

## 2023-04-15 MED ORDER — LIDOCAINE-EPINEPHRINE 1 %-1:100000 IJ SOLN
8.0000 mL | Freq: Once | INTRAMUSCULAR | Status: AC
  Administered 2023-04-15: 8 mL

## 2023-04-15 MED ORDER — LIDOCAINE HCL URETHRAL/MUCOSAL 2 % EX GEL
1.0000 | Freq: Once | CUTANEOUS | Status: AC
  Administered 2023-04-15: 1 via URETHRAL

## 2023-04-15 NOTE — Assessment & Plan Note (Signed)
-   For refractory OAB we reviewed the procedure for intravesical Botox injection with cystoscopy in the office and reviewed the risks, benefits and alternatives of treatment including but not limited to infection, need for self-catheterization and need for repeat therapy.  We discussed that there is a 5-15% chance of needing to catheterize with Botox and that this usually resolves in a few months; however can persist for longer periods of time.  Typically Botox injections would need to be repeated every 3-12 months since this is not a permanent therapy.  - We discussed the role of sacral neuromodulation and how it works. It requires a test phase, and documentation of bladder function via diary. After a successful test period, a permanent wire and generator are placed in the OR. - We also discussed the role of percutaneous tibial nerve stimulation and how it works.  She understands it requires 12 weekly visits for temporary neuromodulation of the sacral nerve roots via the tibial nerve and that she may then require continued tapered treatment.   - She may be interested in PTNS but is unsure. She will have urethral bulking today and return in a month to review other options. IUGA handout provided to review.

## 2023-04-15 NOTE — Patient Instructions (Signed)

## 2023-04-15 NOTE — Progress Notes (Signed)
 [facility] Return Visit  SUBJECTIVE  History of Present Illness: Cheryl Beard is a 68 y.o. female seen in follow-up for mixed incontinence. She wants to proceed with urethral bulking.  She is currently on Gemtesa, which has improved her ability to hold her urine for several hours. However she is still leaking throughout the day and will randomly have large volume gushes of urine.    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 tophus (05/10/2019), Depression, Erosive esophagitis, Erosive gastritis, Fibromyalgia, Gallbladder disease, GERD (gastroesophageal reflux disease), GERD without esophagitis (05/10/2019), History of Clostridium difficile infection, History of COVID-19, History of myocardial infarction, Hyperlipidemia, Hypertension, Hypertensive heart disease without heart failure (05/10/2019), Hypertensive urgency, Idiopathic gout, Left hip pain (01/23/2021), Lumbar back pain (01/23/2021), Mixed hyperlipidemia (08/14/2014), Morbid obesity (HCC) (05/14/2019), Morbid obesity with BMI of 45.0-49.9, adult (HCC) (05/14/2019), Myocardial infarction (HCC), Nontoxic multinodular goiter, Osteopenia (12/13/2019), Other asthma, Overactive bladder (05/11/2022), Prediabetes (05/05/2021), Telogen effluvium (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   Past Surgical History: She  has a past surgical history that includes Abdominal hysterectomy; Cholecystectomy; Appendectomy; Rotator cuff repair; and Cataract extraction (10/2021).   Medications: She has a current medication list which includes the following prescription(s): allopurinol, amlodipine, aspirin ec, calcium, cholecalciferol, edarbi, metoprolol succinate, multiple vitamins-minerals, nitroglycerin, fish oil, rosuvastatin, and gemtesa, and the following Facility-Administered Medications:  lidocaine and lidocaine-epinephrine.   Allergies: Patient is allergic to candesartan, clarithromycin, colesevelam, duloxetine, lovaza [omega-3-acid ethyl esters (fish)], and pregabalin.   Social History: Patient  reports that she quit smoking about 23 years ago. Her smoking use included cigarettes. She has never used smokeless tobacco. She reports that she does not drink alcohol and does not use drugs.     OBJECTIVE     Physical Exam: Vitals:   04/15/23 0915  BP: 123/72  Pulse: 60   Gen: No apparent distress, A&O x 3.  Verbal consent was obtained to perform simple CMG procedure:   Prolapse was reduced using 2 large cotton swabs. Urethra was prepped with betadine and a 74F catheter was placed and bladder was drained completely. The bladder was then backfilled with sterile water by gravity.  First sensation:  First Desire:  Strong Desire:  Capacity:  Multiple episodes of DO were noted with filling and patient endorsed urgency.  Cough stress test was positive. Valsalva stress test was not done.  Catheter replaced to drain bladder.   Interpretation: CMG showed increased sensation, and normal cystometric capacity. Findings positive for stress incontinence, positive for detrusor overactivity.    ASSESSMENT AND PLAN    [name] is a 68 y.o. with:  1. SUI (stress urinary incontinence, female)   2. Urinary frequency   3. Overactive bladder     SUI (stress urinary incontinence, female) Assessment & Plan: - Pt is interested in urethral bulking procedure with Bulkamid.  - She prefers an office procedure. We discussed success rate of approximately 70-80% and possible need for second injection. We reviewed that this is not a permanent procedure and the Bulkamid does dissolve over time. Risks reviewed including injury to bladder or urethra, UTI, urinary retention and hematuria.    Orders: -     Lidocaine-EPINEPHrine -     lidocaine  Urinary frequency -     POCT urinalysis  dipstick  Overactive bladder Assessment & Plan: - For refractory OAB we reviewed the procedure for intravesical Botox injection with  cystoscopy in the office and reviewed the risks, benefits and alternatives of treatment including but not limited to infection, need for self-catheterization and need for repeat therapy.  We discussed that there is a 5-15% chance of needing to catheterize with Botox and that this usually resolves in a few months; however can persist for longer periods of time.  Typically Botox injections would need to be repeated every 3-12 months since this is not a permanent therapy.  - We discussed the role of sacral neuromodulation and how it works. It requires a test phase, and documentation of bladder function via diary. After a successful test period, a permanent wire and generator are placed in the OR. - We also discussed the role of percutaneous tibial nerve stimulation and how it works.  She understands it requires 12 weekly visits for temporary neuromodulation of the sacral nerve roots via the tibial nerve and that she may then require continued tapered treatment.   - She may be interested in PTNS but is unsure. She will have urethral bulking today and return in a month to review other options. IUGA handout provided to review.      Marguerita Beards, MD

## 2023-04-15 NOTE — Assessment & Plan Note (Signed)
-   Pt is interested in urethral bulking procedure with Bulkamid.  - She prefers an office procedure. We discussed success rate of approximately 70-80% and possible need for second injection. We reviewed that this is not a permanent procedure and the Bulkamid does dissolve over time. Risks reviewed including injury to bladder or urethra, UTI, urinary retention and hematuria.

## 2023-04-15 NOTE — Progress Notes (Signed)
 Bulkamid Injection  CC: 68 y.o. y.o. F with stress incontinence who presents for transurethral Bulkamid injection.  Patient signed her consent form.  She started antibiotic prophylaxis today.  Today's Vitals   04/15/23 0915  BP: 123/72  Pulse: 60    Results for orders placed or performed in visit on 04/15/23 (from the past 24 hours)  POCT Urinalysis Dipstick     Status: Abnormal   Collection Time: 04/15/23 12:06 PM  Result Value Ref Range   Color, UA Yellow    Clarity, UA Clear    Glucose, UA Negative Negative   Bilirubin, UA Negative    Ketones, UA Negative    Spec Grav, UA <=1.005 (A) 1.010 - 1.025   Blood, UA Trace-Intact    pH, UA 5.5 5.0 - 8.0   Protein, UA Negative Negative   Urobilinogen, UA 0.2 0.2 or 1.0 E.U./dL   Nitrite, UA Negative    Leukocytes, UA Negative Negative   Appearance     Odor      Procedure: Time out was performed. The bladder was catheterized and 10 ml of 2% lidocaine jelly placed in the urethra. A urethral block was performed by injecting 4ml of 1% lidocaine with epinephrine at 3 and 9 o'clock adjacent to the urethra.  The needle was primed.  The cystoscope was inserted to the level of the bladder neck.  The needle was inserted 2 cm and the scope was pulled back into the urethra 2 cm.  The needle was inserted bevel up at the 5 o'clock position and the Bulkamid was injected to obtain coaptation.  This was repeated at the 2 o'clock,  10 o'clock and 7 o'clock positions.   A total of 2- 1ml syringes were used and good circumferential coaptation was noted.  The patient tolerated the procedure well. She was asked to void after the procedure.  Post-Void Residual (PVR) by Bladder Scan: In order to evaluate bladder emptying, we discussed obtaining a postvoid residual and she agreed to this procedure. Patient voided in hat.  Procedure: The ultrasound unit was placed on the patient's abdomen in the suprapubic region after the patient had voided. A PVR of 80  ml was obtained by bladder scan.   ASSESSMENT: 68 y.o. y.o. s/p transurethral Bulkamid injection for stress incontinence.   PLAN: Patient will follow up in 4 weeks to reassess. Voiding and post-procedure precautions were given. She will return for heavy bleeding, fevers, dysuria lasting beyond today and incomplete emptying.  All questions were answered.  Marguerita Beards, MD

## 2023-04-16 ENCOUNTER — Other Ambulatory Visit (HOSPITAL_COMMUNITY): Payer: Self-pay

## 2023-04-17 ENCOUNTER — Other Ambulatory Visit (HOSPITAL_COMMUNITY): Payer: Self-pay

## 2023-04-19 ENCOUNTER — Other Ambulatory Visit (HOSPITAL_COMMUNITY): Payer: Self-pay

## 2023-04-19 ENCOUNTER — Other Ambulatory Visit: Payer: Self-pay

## 2023-05-07 ENCOUNTER — Ambulatory Visit: Payer: PPO | Admitting: Obstetrics and Gynecology

## 2023-05-10 NOTE — Progress Notes (Unsigned)
 Subjective:  Patient ID: Cheryl Beard, female    DOB: 12-12-1955  Age: 68 y.o. MRN: [id number]  Chief Complaint  Patient presents with   Medical Management of Chronic Issues    PreDiabetes:  Eating healthy Most recent A1C: 5.5.   Hyperlipidemia: Current medications: rosuvastastin 20 mg once before bed and fish oil 1000 mg qam    Hypertensive heart disease: Complications: CAD Current medications: Aspirin 81 mg daily. Rosuvastatin 20 mg daily, edarbi 80 mg daily, amlodipine 2.5 mg daily,  and toprol xl 25 daily. Has NTG.    Overactive bladder: Patient is currently taking Gemtesa 75 mg taking once daily. Patient had urethral bulking for stress urinary incontinence. It was very painful. Scheduled to return to follow up and they are considering percutaneous tibial nerve stimulation. Patient sees Dr. Lanetta Inch.    Diet: eating healthy.  Walks for exercise. 7000-10000 steps per day.  Gout: on Allopurinol.      11/10/2022    8:54 AM 06/16/2022   10:34 AM 05/11/2022    9:08 AM 01/09/2022    9:02 AM 06/06/2021    9:32 AM  Depression screen PHQ 2/9  Decreased Interest 0 0 0 0 0  Down, Depressed, Hopeless 0 0 0 0 0  PHQ - 2 Score 0 0 0 0 0        06/12/2022    8:44 AM  Fall Risk   Falls in the past year? 1  Number falls in past yr: 1  Injury with Fall? 1  Risk for fall due to : History of fall(s)    Patient Care Team: Blane Ohara, MD as PCP - General (Family Medicine) Georgeanna Lea, MD as Consulting Physician (Cardiology)   Review of Systems  Constitutional:  Negative for chills, fatigue and fever.  HENT:  Negative for congestion, ear pain, rhinorrhea and sore throat.   Respiratory:  Negative for cough and shortness of breath.   Cardiovascular:  Negative for chest pain.  Gastrointestinal:  Negative for abdominal pain, constipation, diarrhea, nausea and vomiting.  Genitourinary:  Negative for dysuria and urgency.  Musculoskeletal:  Negative for back  pain and myalgias.  Neurological:  Negative for dizziness, weakness, light-headedness and headaches.  Psychiatric/Behavioral:  Negative for dysphoric mood. The patient is not nervous/anxious.     Current Outpatient Medications on File Prior to Visit  Medication Sig Dispense Refill   allopurinol (ZYLOPRIM) 300 MG tablet Take 1 tablet (300 mg total) by mouth daily. 90 tablet 1   amLODipine (NORVASC) 2.5 MG tablet Take 1 tablet (2.5 mg total) by mouth daily. 90 tablet 3   aspirin EC 81 MG tablet Take 81 mg by mouth daily.     CALCIUM PO Take 600 mg by mouth 2 (two) times daily.     cholecalciferol (VITAMIN D3) 25 MCG (1000 UNIT) tablet Take 1,000 Units by mouth daily.     EDARBI 80 MG TABS Take 1 tablet (80 mg total) by mouth daily. 90 tablet 1   metoprolol succinate (TOPROL-XL) 25 MG 24 hr tablet Take 1 tablet (25 mg total) by mouth daily. 90 tablet 1   Multiple Vitamins-Minerals (WOMENS MULTIVITAMIN PLUS PO) Take 1 tablet by mouth every morning.     nitroGLYCERIN (NITROSTAT) 0.4 MG SL tablet Dissolve 1 tablet under the tongue every 5 minutes as needed for chest pain. Do not exceed a total of 3 doses in 15 minutes. (Patient taking differently: Place 0.4 mg under the tongue every 5 (five) minutes as needed  for chest pain.) 25 tablet 3   Omega-3 Fatty Acids (FISH OIL) 1000 MG CAPS Take 2 capsules by mouth daily. 2 capsules am and 1 capsule pm     rosuvastatin (CRESTOR) 20 MG tablet Take 1 tablet (20 mg total) by mouth at bedtime. 90 tablet 1   Vibegron (GEMTESA) 75 MG TABS Take 1 tablet (75 mg total) by mouth daily. 30 tablet 6   No current facility-administered medications on file prior to visit.   Past Medical History:  Diagnosis Date   Allergy    CAD (coronary artery disease)    Chronic idiopathic gout involving toe of left foot without tophus 05/10/2019   Depression    Erosive esophagitis    Erosive gastritis    Fibromyalgia    Gallbladder disease    GERD (gastroesophageal reflux  disease)    GERD without esophagitis 05/10/2019   History of Clostridium difficile infection    History of COVID-19    History of myocardial infarction    Hyperlipidemia    Hypertension    Hypertensive heart disease without heart failure 05/10/2019   Hypertensive urgency    Idiopathic gout    Left hip pain 01/23/2021   Lumbar back pain 01/23/2021   Mixed hyperlipidemia 08/14/2014   Morbid obesity (HCC) 05/14/2019   Morbid obesity with BMI of 45.0-49.9, adult (HCC) 05/14/2019   Myocardial infarction (HCC)    2010   Nontoxic multinodular goiter    Osteopenia 12/13/2019   Other asthma    Overactive bladder 05/11/2022   Prediabetes 05/05/2021   Telogen effluvium 05/10/2019   Uncomplicated asthma 08/10/2019   Vitamin D deficiency    Past Surgical History:  Procedure Laterality Date   ABDOMINAL HYSTERECTOMY     one ovary removed   APPENDECTOMY     CATARACT EXTRACTION  10/2021   CHOLECYSTECTOMY     ROTATOR CUFF REPAIR      Family History  Problem Relation Age of Onset   Cancer Mother    Parkinson's disease Mother    Diabetes Mother    Hypertension Mother    Stroke Father    Hyperlipidemia Sister    Hypertension Sister    Fibromyalgia Sister    Cancer Sister    Breast cancer Maternal Aunt    Breast cancer Maternal Aunt    Alzheimer's disease Maternal Grandmother    Cancer Paternal Grandmother        Leukemia   Social History   Socioeconomic History   Marital status: Widowed    Spouse name: Not on file   Number of children: 3   Years of education: Not on file   Highest education level: GED or equivalent  Occupational History   Not on file  Tobacco Use   Smoking status: Former    Current packs/day: 0.00    Types: Cigarettes    Quit date: 2002    Years since quitting: 23.3   Smokeless tobacco: Never  Vaping Use   Vaping status: Never Used  Substance and Sexual Activity   Alcohol use: Never   Drug use: Never   Sexual activity: Not Currently  Other Topics  Concern   Not on file  Social History Narrative   Not on file   Social Drivers of Health   Financial Resource Strain: Low Risk  (05/07/2023)   Overall Financial Resource Strain (CARDIA)    Difficulty of Paying Living Expenses: Not very hard  Food Insecurity: No Food Insecurity (05/07/2023)   Hunger Vital Sign  Worried About Programme researcher, broadcasting/film/video in the Last Year: Never true    Ran Out of Food in the Last Year: Never true  Transportation Needs: No Transportation Needs (05/07/2023)   PRAPARE - Administrator, Civil Service (Medical): No    Lack of Transportation (Non-Medical): No  Physical Activity: Insufficiently Active (05/07/2023)   Exercise Vital Sign    Days of Exercise per Week: 3 days    Minutes of Exercise per Session: 30 min  Stress: No Stress Concern Present (05/07/2023)   [employer] of Occupational Health - Occupational Stress Questionnaire    Feeling of Stress : Only a little  Social Connections: Moderately Isolated (05/07/2023)   Social Connection and Isolation Panel [NHANES]    Frequency of Communication with Friends and Family: More than three times a week    Frequency of Social Gatherings with Friends and Family: Twice a week    Attends Religious Services: More than 4 times per year    Active Member of [employer] or Organizations: No    Attends Banker Meetings: Never    Marital Status: Widowed    Objective:  BP 128/80   Pulse 72   Temp 98.1 F (36.7 C)   Ht 5\' 2"  (1.575 m)   Wt 263 lb (119.3 kg)   SpO2 97%   BMI 48.10 kg/m      05/11/2023    8:55 AM 04/15/2023    9:15 AM 01/07/2023    2:02 PM  BP/Weight  Systolic BP 128 123 124  Diastolic BP 80 72 73  Wt. (Lbs) 263    BMI 48.1 kg/m2      Physical Exam Vitals reviewed.  Constitutional:      Appearance: Normal appearance. She is obese.  Neck:     Vascular: No carotid bruit.  Cardiovascular:     Rate and Rhythm: Normal rate and regular rhythm.     Heart sounds: Normal  heart sounds.  Pulmonary:     Effort: Pulmonary effort is normal. No respiratory distress.     Breath sounds: Normal breath sounds.  Abdominal:     General: Abdomen is flat. Bowel sounds are normal.     Palpations: Abdomen is soft.     Tenderness: There is no abdominal tenderness.  Neurological:     Mental Status: She is alert and oriented to person, place, and time.  Psychiatric:        Mood and Affect: Mood normal.        Behavior: Behavior normal.     Diabetic Foot Exam - Simple   No data filed      Lab Results  Component Value Date   WBC 8.5 11/10/2022   HGB 14.1 11/10/2022   HCT 43.6 11/10/2022   PLT 191 11/10/2022   GLUCOSE 91 11/10/2022   CHOL 122 11/10/2022   TRIG 183 (H) 11/10/2022   HDL 41 11/10/2022   LDLCALC 51 11/10/2022   ALT 20 11/10/2022   AST 26 11/10/2022   NA 141 11/10/2022   K 4.9 11/10/2022   CL 103 11/10/2022   CREATININE 0.97 11/10/2022   BUN 15 11/10/2022   CO2 24 11/10/2022   TSH 2.170 05/11/2022   HGBA1C 5.6 11/10/2022      Assessment & Plan:   Hypertensive heart disease without heart failure Assessment & Plan: The current medical regimen is effective;  continue present plan and medications. Continue her current medications metoprolol XL 25 mg once daily, Edarbi 80 mg once  daily, amlodipine 2.5 mg daily.  check labs.  Given samples of edarbi 80 mg daily. Also had samples of edarbichlor 40/12.5 mg - may take 2 daily, but must check bp daily. If bp too low, recommend take 40/12.5 mg once daily.   Orders: -     CBC with Differential/Platelet -     Comprehensive metabolic panel with GFR  Prediabetes Assessment & Plan: Recommend continue to work on eating healthy diet and exercise.   Orders: -     Hemoglobin A1c  Mixed hyperlipidemia Assessment & Plan: The current medical regimen is effective;  continue present plan and medications. Continue with rosuvastatin 20 mg once daily and fish oil 1000 mg once daily Check lipids.    Orders: -     Lipid panel  GERD without esophagitis  Chronic idiopathic gout involving toe of left foot without tophus Assessment & Plan: Continue allopurinol. Well controlled.    Morbid obesity with BMI of 45.0-49.9, adult [facility]) Assessment & Plan: Recommend continue to work on eating healthy diet and exercise.       No orders of the defined types were placed in this encounter.   Orders Placed This Encounter  Procedures   CBC with Differential/Platelet   Comprehensive metabolic panel with GFR   Hemoglobin A1c   Lipid panel     Follow-up: Return in about 6 months (around 11/10/2023) for chronic follow up.   I,Marla I Leal-Borjas,acting as a scribe for Mercy Stall, MD.,have documented all relevant documentation on the behalf of Mercy Stall, MD,as directed by  Mercy Stall, MD while in the presence of Mercy Stall, MD.   An After Visit Summary was printed and given to the patient.  I attest that I have reviewed this visit and agree with the plan scribed by my staff.   Mercy Stall, MD [facility] [phone]

## 2023-05-11 ENCOUNTER — Ambulatory Visit (INDEPENDENT_AMBULATORY_CARE_PROVIDER_SITE_OTHER): Payer: PPO | Admitting: Family Medicine

## 2023-05-11 ENCOUNTER — Encounter: Payer: Self-pay | Admitting: Family Medicine

## 2023-05-11 VITALS — BP 128/80 | HR 72 | Temp 98.1°F | Ht 62.0 in | Wt 263.0 lb

## 2023-05-11 DIAGNOSIS — I119 Hypertensive heart disease without heart failure: Secondary | ICD-10-CM

## 2023-05-11 DIAGNOSIS — Z6841 Body Mass Index (BMI) 40.0 and over, adult: Secondary | ICD-10-CM

## 2023-05-11 DIAGNOSIS — E782 Mixed hyperlipidemia: Secondary | ICD-10-CM

## 2023-05-11 DIAGNOSIS — M1A0720 Idiopathic chronic gout, left ankle and foot, without tophus (tophi): Secondary | ICD-10-CM | POA: Diagnosis not present

## 2023-05-11 DIAGNOSIS — R7303 Prediabetes: Secondary | ICD-10-CM | POA: Diagnosis not present

## 2023-05-11 DIAGNOSIS — K219 Gastro-esophageal reflux disease without esophagitis: Secondary | ICD-10-CM | POA: Diagnosis not present

## 2023-05-11 NOTE — Assessment & Plan Note (Signed)
 Continue allopurinol. Well controlled.

## 2023-05-11 NOTE — Assessment & Plan Note (Addendum)
 The current medical regimen is effective;  continue present plan and medications. Continue her current medications metoprolol XL 25 mg once daily, Edarbi 80 mg once daily, amlodipine 2.5 mg daily.  check labs.  Given samples of edarbi 80 mg daily. Also had samples of edarbichlor 40/12.5 mg - may take 2 daily, but must check bp daily. If bp too low, recommend take 40/12.5 mg once daily.

## 2023-05-11 NOTE — Assessment & Plan Note (Signed)
 Recommend continue to work on eating healthy diet and exercise.

## 2023-05-11 NOTE — Assessment & Plan Note (Addendum)
 The current medical regimen is effective;  continue present plan and medications. Continue with rosuvastatin 20 mg once daily and fish oil 1000 mg once daily Check lipids.

## 2023-05-12 ENCOUNTER — Encounter: Payer: Self-pay | Admitting: Family Medicine

## 2023-05-12 LAB — CBC WITH DIFFERENTIAL/PLATELET
Basophils Absolute: 0 10*3/uL (ref 0.0–0.2)
Basos: 0 %
EOS (ABSOLUTE): 0.1 10*3/uL (ref 0.0–0.4)
Eos: 1 %
Hematocrit: 40.9 % (ref 34.0–46.6)
Hemoglobin: 13.6 g/dL (ref 11.1–15.9)
Immature Grans (Abs): 0 10*3/uL (ref 0.0–0.1)
Immature Granulocytes: 0 %
Lymphocytes Absolute: 2.6 10*3/uL (ref 0.7–3.1)
Lymphs: 27 %
MCH: 31.3 pg (ref 26.6–33.0)
MCHC: 33.3 g/dL (ref 31.5–35.7)
MCV: 94 fL (ref 79–97)
Monocytes Absolute: 0.6 10*3/uL (ref 0.1–0.9)
Monocytes: 7 %
Neutrophils Absolute: 6.1 10*3/uL (ref 1.4–7.0)
Neutrophils: 65 %
Platelets: 192 10*3/uL (ref 150–450)
RBC: 4.35 x10E6/uL (ref 3.77–5.28)
RDW: 13.3 % (ref 11.7–15.4)
WBC: 9.4 10*3/uL (ref 3.4–10.8)

## 2023-05-12 LAB — HEMOGLOBIN A1C
Est. average glucose Bld gHb Est-mCnc: 111 mg/dL
Hgb A1c MFr Bld: 5.5 % (ref 4.8–5.6)

## 2023-05-12 LAB — COMPREHENSIVE METABOLIC PANEL WITH GFR
ALT: 18 IU/L (ref 0–32)
AST: 23 IU/L (ref 0–40)
Albumin: 4.5 g/dL (ref 3.9–4.9)
Alkaline Phosphatase: 87 IU/L (ref 44–121)
BUN/Creatinine Ratio: 18 (ref 12–28)
BUN: 17 mg/dL (ref 8–27)
Bilirubin Total: 0.3 mg/dL (ref 0.0–1.2)
CO2: 21 mmol/L (ref 20–29)
Calcium: 9.6 mg/dL (ref 8.7–10.3)
Chloride: 104 mmol/L (ref 96–106)
Creatinine, Ser: 0.93 mg/dL (ref 0.57–1.00)
Globulin, Total: 2.5 g/dL (ref 1.5–4.5)
Glucose: 89 mg/dL (ref 70–99)
Potassium: 5 mmol/L (ref 3.5–5.2)
Sodium: 142 mmol/L (ref 134–144)
Total Protein: 7 g/dL (ref 6.0–8.5)
eGFR: 67 mL/min/{1.73_m2} (ref >59)

## 2023-05-12 LAB — LIPID PANEL
Chol/HDL Ratio: 3.1 ratio (ref 0.0–4.4)
Cholesterol, Total: 135 mg/dL (ref 100–199)
HDL: 44 mg/dL (ref >39)
LDL Chol Calc (NIH): 64 mg/dL (ref 0–99)
Triglycerides: 160 mg/dL — ABNORMAL HIGH (ref 0–149)
VLDL Cholesterol Cal: 27 mg/dL (ref 5–40)

## 2023-05-14 ENCOUNTER — Other Ambulatory Visit (HOSPITAL_COMMUNITY): Payer: Self-pay

## 2023-05-14 ENCOUNTER — Other Ambulatory Visit: Payer: Self-pay

## 2023-05-17 ENCOUNTER — Other Ambulatory Visit: Payer: Self-pay

## 2023-05-21 ENCOUNTER — Ambulatory Visit: Admitting: Obstetrics and Gynecology

## 2023-05-21 ENCOUNTER — Encounter: Payer: Self-pay | Admitting: Obstetrics and Gynecology

## 2023-05-21 VITALS — BP 124/80 | HR 72

## 2023-05-21 DIAGNOSIS — Z960 Presence of urogenital implants: Secondary | ICD-10-CM | POA: Diagnosis not present

## 2023-05-21 DIAGNOSIS — N3281 Overactive bladder: Secondary | ICD-10-CM

## 2023-05-21 DIAGNOSIS — N393 Stress incontinence (female) (male): Secondary | ICD-10-CM | POA: Diagnosis not present

## 2023-05-21 DIAGNOSIS — Z4689 Encounter for fitting and adjustment of other specified devices: Secondary | ICD-10-CM

## 2023-05-21 NOTE — Progress Notes (Signed)
 [facility] Return Visit  SUBJECTIVE  History of Present Illness: Cheryl Beard is a 68 y.o. female seen in follow-up for mixed incontinence. She underwent urethral bulking on 04/15/23  Has noticed good results with the Bulkamid. Had a coughing spell and no leakage. Has not noticed urine on her liner, maybe a small amount occasionally in the morning (but she takes lasix and this affects her morning void). She is currently on Gemtesa , which has improved her ability to hold her urine for several hours.   Pessary last cleaned December. No issues with pessary. Denies vaginal bleeding or discharge. Using coconut oil for moisture.   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 tophus (05/10/2019), Depression, Erosive esophagitis, Erosive gastritis, Fibromyalgia, Gallbladder disease, GERD (gastroesophageal reflux disease), GERD without esophagitis (05/10/2019), History of Clostridium difficile infection, History of COVID-19, History of myocardial infarction, Hyperlipidemia, Hypertension, Hypertensive heart disease without heart failure (05/10/2019), Hypertensive urgency, Idiopathic gout, Left hip pain (01/23/2021), Lumbar back pain (01/23/2021), Mixed hyperlipidemia (08/14/2014), Morbid obesity (HCC) (05/14/2019), Morbid obesity with BMI of 45.0-49.9, adult (HCC) (05/14/2019), Myocardial infarction (HCC), Nontoxic multinodular goiter, Osteopenia (12/13/2019), Other asthma, Overactive bladder (05/11/2022), Prediabetes (05/05/2021), Telogen effluvium (05/10/2019), Uncomplicated asthma (08/10/2019), and Vitamin D  deficiency.   Past Surgical History: She  has a past surgical history that includes Abdominal hysterectomy; Cholecystectomy; Appendectomy; Rotator cuff repair; and Cataract extraction (10/2021).   Medications: She has a current medication list which includes the  following prescription(s): allopurinol , amlodipine , aspirin ec, calcium , cholecalciferol, edarbi , metoprolol  succinate, multiple vitamins-minerals, nitroglycerin , fish oil, rosuvastatin , and gemtesa .   Allergies: Patient is allergic to candesartan, clarithromycin, colesevelam, duloxetine, lovaza [omega-3-acid ethyl esters (fish)], and pregabalin.   Social History: Patient  reports that she quit smoking about 23 years ago. Her smoking use included cigarettes. She has never used smokeless tobacco. She reports that she does not drink alcohol and does not use drugs.     OBJECTIVE     Physical Exam: Vitals:   05/21/23 0856  BP: 124/80  Pulse: 72    Gen: No apparent distress, A&O x 3.  Pessary was removed and cleaned. Speculum exam showed mild erythema at the top of the vagina but no abrasions or bleeding present. Pessary replaced. It felt comfortable and stayed in place with valsalva.   ASSESSMENT AND PLAN    Cheryl Beard is a 68 y.o. with:  1. Encounter for pessary maintenance   2. Overactive bladder   3. SUI (stress urinary incontinence, female)     - Pessary cleaned today. Will plan for q4 month cleanings.  - Continue with Gemtesa  for OAB - Has had good results with urethral bulking. We discussed that the Bulkamid dissolves over time and so she will need retreatment again in the future.   Return 4 months for a pessary cleaning  Arma Lamp, MD   Time spent: I spent 25 minutes dedicated to the care of this patient on the date of this encounter to include pre-visit review of records, face-to-face time with the patient and post visit documentation.

## 2023-06-14 ENCOUNTER — Other Ambulatory Visit (HOSPITAL_COMMUNITY): Payer: Self-pay

## 2023-06-15 ENCOUNTER — Other Ambulatory Visit: Payer: Self-pay

## 2023-06-15 ENCOUNTER — Other Ambulatory Visit: Payer: Self-pay | Admitting: Medical Genetics

## 2023-06-18 ENCOUNTER — Other Ambulatory Visit (HOSPITAL_COMMUNITY): Payer: Self-pay

## 2023-06-22 ENCOUNTER — Ambulatory Visit

## 2023-06-22 VITALS — BP 114/60 | HR 66 | Wt 260.0 lb

## 2023-06-22 DIAGNOSIS — Z0000 Encounter for general adult medical examination without abnormal findings: Secondary | ICD-10-CM | POA: Diagnosis not present

## 2023-06-22 NOTE — Patient Instructions (Signed)
 [name] , Thank you for taking time to come for your Medicare Wellness Visit. I appreciate your ongoing commitment to your health goals. Please review the following plan we discussed and let me know if I can assist you in the future.    This is a list of the screening recommended for you and due dates:  Health Maintenance  Topic Date Due   COVID-19 Vaccine (3 - Moderna risk series) 02/20/2020   Flu Shot  08/27/2023   Mammogram  01/06/2024   Medicare Annual Wellness Visit  06/21/2024   Colon Cancer Screening  04/03/2030   DTaP/Tdap/Td vaccine (2 - Td or Tdap) 05/02/2031   Pneumonia Vaccine  Completed   DEXA scan (bone density measurement)  Completed   Hepatitis C Screening  Completed   Zoster (Shingles) Vaccine  Completed   HPV Vaccine  Aged Out   Meningitis B Vaccine  Aged Out    Advanced directives: Please bring a copy for your medical record   Preventive Care 65 Years and Older, Female Preventive care refers to lifestyle choices and visits with your health care provider that can promote health and wellness. What does preventive care include? A yearly physical exam. This is also called an annual well check. Dental exams once or twice a year. Routine eye exams. Ask your health care provider how often you should have your eyes checked. Personal lifestyle choices, including: Daily care of your teeth and gums. Regular physical activity. Eating a healthy diet. Avoiding tobacco and drug use. Limiting alcohol use. Practicing safe sex. Taking low-dose aspirin every day. Taking vitamin and mineral supplements as recommended by your health care provider. What happens during an annual well check? The services and screenings done by your health care provider during your annual well check will depend on your age, overall health, lifestyle risk factors, and family history of disease. Counseling  Your health care provider may ask you questions about your: Alcohol use. Tobacco  use. Drug use. Emotional well-being. Home and relationship well-being. Sexual activity. Eating habits. History of falls. Memory and ability to understand (cognition). Work and work Astronomer. Reproductive health. Screening  You may have the following tests or measurements: Height, weight, and BMI. Blood pressure. Lipid and cholesterol levels. These may be checked every 5 years, or more frequently if you are over 25 years old. Skin check. Lung cancer screening. You may have this screening every year starting at age 89 if you have a 30-pack-year history of smoking and currently smoke or have quit within the past 15 years. Fecal occult blood test (FOBT) of the stool. You may have this test every year starting at age 95. Flexible sigmoidoscopy or colonoscopy. You may have a sigmoidoscopy every 5 years or a colonoscopy every 10 years starting at age 73. Hepatitis C blood test. Hepatitis B blood test. Sexually transmitted disease (STD) testing. Diabetes screening. This is done by checking your blood sugar (glucose) after you have not eaten for a while (fasting). You may have this done every 1-3 years. Bone density scan. This is done to screen for osteoporosis. You may have this done starting at age 26. Mammogram. This may be done every 1-2 years. Talk to your health care provider about how often you should have regular mammograms. Talk with your health care provider about your test results, treatment options, and if necessary, the need for more tests. Vaccines  Your health care provider may recommend certain vaccines, such as: Influenza vaccine. This is recommended every year. Tetanus, diphtheria,  and acellular pertussis (Tdap, Td) vaccine. You may need a Td booster every 10 years. Zoster vaccine. You may need this after age 35. Pneumococcal 13-valent conjugate (PCV13) vaccine. One dose is recommended after age 53. Pneumococcal polysaccharide (PPSV23) vaccine. One dose is recommended  after age 51. Talk to your health care provider about which screenings and vaccines you need and how often you need them. This information is not intended to replace advice given to you by your health care provider. Make sure you discuss any questions you have with your health care provider. Document Released: 02/08/2015 Document Revised: 10/02/2015 Document Reviewed: 11/13/2014 Elsevier Interactive Patient Education  2017 [employer].  Fall Prevention in the Home Falls can cause injuries. They can happen to people of all ages. There are many things you can do to make your home safe and to help prevent falls. What can I do on the outside of my home? Regularly fix the edges of walkways and driveways and fix any cracks. Remove anything that might make you trip as you walk through a door, such as a raised step or threshold. Trim any bushes or trees on the path to your home. Use bright outdoor lighting. Clear any walking paths of anything that might make someone trip, such as rocks or tools. Regularly check to see if handrails are loose or broken. Make sure that both sides of any steps have handrails. Any raised decks and porches should have guardrails on the edges. Have any leaves, snow, or ice cleared regularly. Use sand or salt on walking paths during winter. Clean up any spills in your garage right away. This includes oil or grease spills. What can I do in the bathroom? Use night lights. Install grab bars by the toilet and in the tub and shower. Do not use towel bars as grab bars. Use non-skid mats or decals in the tub or shower. If you need to sit down in the shower, use a plastic, non-slip stool. Keep the floor dry. Clean up any water that spills on the floor as soon as it happens. Remove soap buildup in the tub or shower regularly. Attach bath mats securely with double-sided non-slip rug tape. Do not have throw rugs and other things on the floor that can make you trip. What can I do  in the bedroom? Use night lights. Make sure that you have a light by your bed that is easy to reach. Do not use any sheets or blankets that are too big for your bed. They should not hang down onto the floor. Have a firm chair that has side arms. You can use this for support while you get dressed. Do not have throw rugs and other things on the floor that can make you trip. What can I do in the kitchen? Clean up any spills right away. Avoid walking on wet floors. Keep items that you use a lot in easy-to-reach places. If you need to reach something above you, use a strong step stool that has a grab bar. Keep electrical cords out of the way. Do not use floor polish or wax that makes floors slippery. If you must use wax, use non-skid floor wax. Do not have throw rugs and other things on the floor that can make you trip. What can I do with my stairs? Do not leave any items on the stairs. Make sure that there are handrails on both sides of the stairs and use them. Fix handrails that are broken or loose. Make sure  that handrails are as long as the stairways. Check any carpeting to make sure that it is firmly attached to the stairs. Fix any carpet that is loose or worn. Avoid having throw rugs at the top or bottom of the stairs. If you do have throw rugs, attach them to the floor with carpet tape. Make sure that you have a light switch at the top of the stairs and the bottom of the stairs. If you do not have them, ask someone to add them for you. What else can I do to help prevent falls? Wear shoes that: Do not have high heels. Have rubber bottoms. Are comfortable and fit you well. Are closed at the toe. Do not wear sandals. If you use a stepladder: Make sure that it is fully opened. Do not climb a closed stepladder. Make sure that both sides of the stepladder are locked into place. Ask someone to hold it for you, if possible. Clearly mark and make sure that you can see: Any grab bars or  handrails. First and last steps. Where the edge of each step is. Use tools that help you move around (mobility aids) if they are needed. These include: Canes. Walkers. Scooters. Crutches. Turn on the lights when you go into a dark area. Replace any light bulbs as soon as they burn out. Set up your furniture so you have a clear path. Avoid moving your furniture around. If any of your floors are uneven, fix them. If there are any pets around you, be aware of where they are. Review your medicines with your doctor. Some medicines can make you feel dizzy. This can increase your chance of falling. Ask your doctor what other things that you can do to help prevent falls. This information is not intended to replace advice given to you by your health care provider. Make sure you discuss any questions you have with your health care provider. Document Released: 11/08/2008 Document Revised: 06/20/2015 Document Reviewed: 02/16/2014 Elsevier Interactive Patient Education  2017 [employer].

## 2023-06-22 NOTE — Progress Notes (Signed)
 Subjective:   Cheryl Beard is a 68 y.o. female who presents for Medicare Annual (Subsequent) preventive examination.  This wellness visit is conducted by a nurse.  The patient's medications were reviewed and reconciled since the patient's last visit.  History details were provided by the patient.  The history appears to be reliable.    Medical History: Patient history and Family history was reviewed  Medications, Allergies, and preventative health maintenance was reviewed and updated.   Visit Complete: Virtual I connected with  Cheryl Beard on 06/22/23 by a audio enabled telemedicine application and verified that I am speaking with the correct person using two identifiers.  Patient Location: Home  Provider Location: Office/Clinic  I discussed the limitations of evaluation and management by telemedicine. The patient expressed understanding and agreed to proceed.  Vital Signs: Because this visit was a virtual/telehealth visit, some criteria may be missing or patient reported. Any vitals not documented were not able to be obtained and vitals that have been documented are patient reported.  Patient Medicare AWV questionnaire was completed by the patient on 06/18/23; I have confirmed that all information answered by patient is correct and no changes since this date.  Cardiac Risk Factors include: none     Objective:     Today's Vitals   06/22/23 1001  BP: 114/60  Pulse: 66  Weight: 260 lb (117.9 kg)   Body mass index is 47.55 kg/m.     06/22/2023   10:13 AM 06/06/2021    9:34 AM 12/13/2019   10:08 AM 05/09/2019    9:16 AM 06/27/2018   12:20 PM  Advanced Directives  Does Patient Have a Medical Advance Directive? Yes Yes Yes Yes No  Type of Estate agent of [employer] Power of [place];Living will Living will Healthcare Power of [place];Living will   Does patient want to make changes to medical advance directive? No - Patient declined  Yes  (Inpatient - patient defers changing a medical advance directive and declines information at this time)    Copy of Healthcare Power of Attorney in Chart?  No - copy requested     Would patient like information on creating a medical advance directive?     Yes (MAU/Ambulatory/Procedural Areas - Information given)    Current Medications (verified) Outpatient Encounter Medications as of 06/22/2023  Medication Sig   allopurinol  (ZYLOPRIM ) 300 MG tablet Take 1 tablet (300 mg total) by mouth daily.   amLODipine  (NORVASC ) 2.5 MG tablet Take 1 tablet (2.5 mg total) by mouth daily.   aspirin EC 81 MG tablet Take 81 mg by mouth daily.   CALCIUM  PO Take 600 mg by mouth 2 (two) times daily.   cholecalciferol (VITAMIN D3) 25 MCG (1000 UNIT) tablet Take 1,000 Units by mouth daily.   EDARBI  80 MG TABS Take 1 tablet (80 mg total) by mouth daily.   metoprolol  succinate (TOPROL -XL) 25 MG 24 hr tablet Take 1 tablet (25 mg total) by mouth daily.   Multiple Vitamins-Minerals (WOMENS MULTIVITAMIN PLUS PO) Take 1 tablet by mouth every morning.   nitroGLYCERIN  (NITROSTAT ) 0.4 MG SL tablet Dissolve 1 tablet under the tongue every 5 minutes as needed for chest pain. Do not exceed a total of 3 doses in 15 minutes. (Patient taking differently: Place 0.4 mg under the tongue every 5 (five) minutes as needed for chest pain.)   Omega-3 Fatty Acids (FISH OIL) 1000 MG CAPS Take 2 capsules by mouth daily. 2 capsules am and 1 capsule  pm   rosuvastatin  (CRESTOR ) 20 MG tablet Take 1 tablet (20 mg total) by mouth at bedtime.   Vibegron  (GEMTESA ) 75 MG TABS Take 1 tablet (75 mg total) by mouth daily.   No facility-administered encounter medications on file as of 06/22/2023.    Allergies (verified) Candesartan, Clarithromycin, Colesevelam, Duloxetine, Lovaza [omega-3-acid ethyl esters (fish)], and Pregabalin   History: Past Medical History:  Diagnosis Date   Allergy    CAD (coronary artery disease)    CHF (congestive heart  failure) (HCC)    Chronic idiopathic gout involving toe of left foot without tophus 05/10/2019   Depression    Erosive esophagitis    Erosive gastritis    Fibromyalgia    Gallbladder disease    GERD (gastroesophageal reflux disease)    GERD without esophagitis 05/10/2019   History of Clostridium difficile infection    History of COVID-19    History of myocardial infarction    Hyperlipidemia    Hypertension    Hypertensive heart disease without heart failure 05/10/2019   Hypertensive urgency    Idiopathic gout    Left hip pain 01/23/2021   Lumbar back pain 01/23/2021   Mixed hyperlipidemia 08/14/2014   Morbid obesity (HCC) 05/14/2019   Morbid obesity with BMI of 45.0-49.9, adult (HCC) 05/14/2019   Myocardial infarction (HCC)    2010   Nontoxic multinodular goiter    Osteopenia 12/13/2019   Other asthma    Overactive bladder 05/11/2022   Prediabetes 05/05/2021   Sleep apnea    Telogen effluvium 05/10/2019   Uncomplicated asthma 08/10/2019   Vitamin D  deficiency    Past Surgical History:  Procedure Laterality Date   ABDOMINAL HYSTERECTOMY     one ovary removed   APPENDECTOMY     CATARACT EXTRACTION  10/2021   CHOLECYSTECTOMY     EYE SURGERY     HERNIA REPAIR     ROTATOR CUFF REPAIR     TUBAL LIGATION     Family History  Problem Relation Age of Onset   Cancer Mother    Parkinson's disease Mother    Diabetes Mother    Hypertension Mother    Hyperlipidemia Mother    Stroke Father    Hyperlipidemia Sister    Hypertension Sister    Fibromyalgia Sister    Cancer Sister    Breast cancer Maternal Aunt    Cancer Maternal Aunt    Breast cancer Maternal Aunt    Alzheimer's disease Maternal Grandmother    Cancer Paternal Grandmother        Leukemia   Social History   Socioeconomic History   Marital status: Widowed    Spouse name: Not on file   Number of children: 3   Years of education: Not on file   Highest education level: GED or equivalent  Occupational  History   Not on file  Tobacco Use   Smoking status: Former    Current packs/day: 0.00    Average packs/day: 1.5 packs/day for 25.0 years (37.5 ttl pk-yrs)    Types: Cigarettes    Quit date: 05/30/2000    Years since quitting: 23.0   Smokeless tobacco: Never  Vaping Use   Vaping status: Never Used  Substance and Sexual Activity   Alcohol use: Never   Drug use: Never   Sexual activity: Not Currently  Other Topics Concern   Not on file  Social History Narrative   Not on file   Social Drivers of Health   Financial Resource Strain: Low Risk  (  05/07/2023)   Overall Financial Resource Strain (CARDIA)    Difficulty of Paying Living Expenses: Not very hard  Food Insecurity: No Food Insecurity (05/07/2023)   Hunger Vital Sign    Worried About Running Out of Food in the Last Year: Never true    Ran Out of Food in the Last Year: Never true  Transportation Needs: No Transportation Needs (05/07/2023)   PRAPARE - Administrator, Civil Service (Medical): No    Lack of Transportation (Non-Medical): No  Physical Activity: Insufficiently Active (05/07/2023)   Exercise Vital Sign    Days of Exercise per Week: 3 days    Minutes of Exercise per Session: 30 min  Stress: No Stress Concern Present (05/07/2023)   [employer] of Occupational Health - Occupational Stress Questionnaire    Feeling of Stress : Only a little  Social Connections: Moderately Isolated (05/07/2023)   Social Connection and Isolation Panel [NHANES]    Frequency of Communication with Friends and Family: More than three times a week    Frequency of Social Gatherings with Friends and Family: Twice a week    Attends Religious Services: More than 4 times per year    Active Member of [employer] or Organizations: No    Attends Banker Meetings: Not on file    Marital Status: Widowed    Tobacco Counseling Counseling given: Not Answered   Clinical Intake:  Pre-visit preparation completed: Yes  Pain :  No/denies pain     BMI - recorded: 47.55 Nutritional Status: BMI > 30  Obese Diabetes: No  How often do you need to have someone help you when you read instructions, pamphlets, or other written materials from your doctor or pharmacy?: 1 - Never  Interpreter Needed?: No      Activities of Daily Living    06/18/2023    8:52 AM  In your present state of health, do you have any difficulty performing the following activities:  Hearing? 0  Vision? 0  Difficulty concentrating or making decisions? 0  Walking or climbing stairs? 0  Dressing or bathing? 0  Doing errands, shopping? 0  Preparing Food and eating ? N  Using the Toilet? N  In the past six months, have you accidently leaked urine? Y  Do you have problems with loss of bowel control? N  Managing your Medications? N  Managing your Finances? N  Housekeeping or managing your Housekeeping? N    Patient Care Team: Mercy Stall, MD as PCP - General (Family Medicine) Manfred Seed, MD as Consulting Physician (Cardiology)  Indicate any recent Medical Services you may have received from other than [facility] providers in the past year (date may be approximate).     Assessment:    This is a routine wellness examination for Cheryl Beard.  Hearing/Vision screen No results found.   Goals Addressed   None    Depression Screen    06/22/2023   10:05 AM 11/10/2022    8:54 AM 06/16/2022   10:34 AM 05/11/2022    9:08 AM 01/09/2022    9:02 AM 06/06/2021    9:32 AM 06/04/2020    8:11 AM  PHQ 2/9 Scores  PHQ - 2 Score 0 0 0 0 0 0 0    Fall Risk    06/18/2023    8:52 AM 06/12/2022    8:44 AM 05/11/2022    9:08 AM 01/09/2022    9:02 AM 06/06/2021    9:35 AM  Fall Risk   Falls  in the past year? 0 1 0 0 1  Number falls in past yr: 0 1 0 0 1  Injury with Fall? 0 1 0 0 1  Risk for fall due to : No Fall Risks History of fall(s) No Fall Risks No Fall Risks Impaired balance/gait;Impaired vision  Follow up Falls evaluation  completed;Education provided;Falls prevention discussed  Falls evaluation completed Falls evaluation completed Education provided;Falls prevention discussed;Falls evaluation completed    MEDICARE RISK AT HOME: Medicare Risk at Home Any stairs in or around the home?: (Patient-Rptd) No If so, are there any without handrails?: No Home free of loose throw rugs in walkways, pet beds, electrical cords, etc?: (Patient-Rptd) Yes Adequate lighting in your home to reduce risk of falls?: (Patient-Rptd) Yes Life alert?: (Patient-Rptd) No Use of a cane, walker or w/c?: (Patient-Rptd) No Grab bars in the bathroom?: (Patient-Rptd) No Shower chair or bench in shower?: (Patient-Rptd) No Elevated toilet seat or a handicapped toilet?: (Patient-Rptd) No  TIMED UP AND GO:  Was the test performed?  No    Cognitive Function:        06/22/2023   10:13 AM 06/16/2022   10:39 AM 06/06/2021    9:37 AM 12/13/2019   10:10 AM  6CIT Screen  What Year? 0 points 0 points 0 points 0 points  What month? 0 points 0 points 0 points 0 points  What time? 0 points 0 points 0 points 0 points  Count back from 20 0 points 0 points 0 points 0 points  Months in reverse 0 points 0 points 0 points 0 points  Repeat phrase 0 points 0 points 0 points 0 points  Total Score 0 points 0 points 0 points 0 points    Immunizations Immunization History  Administered Date(s) Administered   Fluad Quad(high Dose 65+) 11/14/2019, 09/20/2020   Influenza, Quadrivalent, Recombinant, Inj, Pf 10/26/2016   Moderna SARS-COV2 Booster Vaccination 01/23/2020   Moderna Sars-Covid-2 Vaccination 03/10/2019, 04/05/2019   PNEUMOCOCCAL CONJUGATE-20 02/19/2022   Pneumococcal Conjugate-13 10/26/2018   Pneumococcal Polysaccharide-23 11/14/2019   Tdap 05/01/2021   Zoster Recombinant(Shingrix) 05/01/2021, 11/05/2021    TDAP status: Up to date  Flu Vaccine status: Declined, Education has been provided regarding the importance of this vaccine but  patient still declined. Advised may receive this vaccine at local pharmacy or Health Dept. Aware to provide a copy of the vaccination record if obtained from local pharmacy or Health Dept. Verbalized acceptance and understanding.  Pneumococcal vaccine status: Up to date  Covid-19 vaccine status: Declined, Education has been provided regarding the importance of this vaccine but patient still declined. Advised may receive this vaccine at local pharmacy or Health Dept.or vaccine clinic. Aware to provide a copy of the vaccination record if obtained from local pharmacy or Health Dept. Verbalized acceptance and understanding.  Qualifies for Shingles Vaccine? Yes   Zostavax completed No   Shingrix Completed?: No.    Education has been provided regarding the importance of this vaccine. Patient has been advised to call insurance company to determine out of pocket expense if they have not yet received this vaccine. Advised may also receive vaccine at local pharmacy or Health Dept. Verbalized acceptance and understanding.  Screening Tests Health Maintenance  Topic Date Due   COVID-19 Vaccine (3 - Moderna risk series) 02/20/2020   Medicare Annual Wellness (AWV)  06/16/2023   INFLUENZA VACCINE  08/27/2023   MAMMOGRAM  01/06/2024   Colonoscopy  04/03/2030   DTaP/Tdap/Td (2 - Td or Tdap) 05/02/2031   Pneumonia  Vaccine 8+ Years old  Completed   DEXA SCAN  Completed   Hepatitis C Screening  Completed   Zoster Vaccines- Shingrix  Completed   HPV VACCINES  Aged Out   Meningococcal B Vaccine  Aged Out    Health Maintenance  Health Maintenance Due  Topic Date Due   COVID-19 Vaccine (3 - Moderna risk series) 02/20/2020   Medicare Annual Wellness (AWV)  06/16/2023    Colorectal cancer screening: No longer required.   Mammogram status: Completed 12/2022. Repeat every year  Bone Density status: Completed 06/2022. Results reflect: Bone density results: OSTEOPENIA. Repeat every 2 years.  Lung Cancer  Screening: (Low Dose CT Chest recommended if Age 85-80 years, 20 pack-year currently smoking OR have quit w/in 15years.) does not qualify.   Lung Cancer Screening Referral: N/A  Additional Screening:  Vision Screening: Recommended annual ophthalmology exams for early detection of glaucoma and other disorders of the eye. Is the patient up to date with their annual eye exam?  Yes  Who is the provider or what is the name of the office in which the patient attends annual eye exams? [facility]  Dental Screening: Recommended annual dental exams for proper oral hygiene  Community Resource Referral / Chronic Care Management: CRR required this visit?  No   CCM required this visit?  No     Plan:   Aim for 30 minutes of exercise or brisk walking, 6-8 glasses of water, and 5 servings of fruits and vegetables each day.   I have personally reviewed and noted the following in the patient's chart:   Medical and social history Use of alcohol, tobacco or illicit drugs  Current medications and supplements including opioid prescriptions. Patient is not currently taking opioid prescriptions. Functional ability and status Nutritional status Physical activity Advanced directives List of other physicians Hospitalizations, surgeries, and ER visits in previous 12 months Vitals Screenings to include cognitive, depression, and falls Referrals and appointments  In addition, I have reviewed and discussed with patient certain preventive protocols, quality metrics, and best practice recommendations. A written personalized care plan for preventive services as well as general preventive health recommendations were provided to patient.     Cheryl Cowden, LPN   03/11/863   After Visit Summary: (MyChart) Due to this being a telephonic visit, the after visit summary with patients personalized plan was offered to patient via MyChart

## 2023-06-29 ENCOUNTER — Other Ambulatory Visit (HOSPITAL_COMMUNITY)
Admission: RE | Admit: 2023-06-29 | Discharge: 2023-06-29 | Disposition: A | Discharge status: Home / Self Care | Payer: Self-pay | Source: Ambulatory Visit | Attending: Medical Genetics | Admitting: Medical Genetics

## 2023-07-12 ENCOUNTER — Telehealth (INDEPENDENT_AMBULATORY_CARE_PROVIDER_SITE_OTHER): Admitting: Physician Assistant

## 2023-07-12 ENCOUNTER — Ambulatory Visit: Payer: Self-pay

## 2023-07-12 ENCOUNTER — Encounter: Payer: Self-pay | Admitting: Physician Assistant

## 2023-07-12 ENCOUNTER — Other Ambulatory Visit (HOSPITAL_COMMUNITY): Payer: Self-pay

## 2023-07-12 ENCOUNTER — Telehealth: Payer: Self-pay | Admitting: Family Medicine

## 2023-07-12 VITALS — BP 134/72 | HR 72 | Ht 62.0 in | Wt 263.0 lb

## 2023-07-12 DIAGNOSIS — K2970 Gastritis, unspecified, without bleeding: Secondary | ICD-10-CM | POA: Diagnosis not present

## 2023-07-12 DIAGNOSIS — E860 Dehydration: Secondary | ICD-10-CM | POA: Diagnosis not present

## 2023-07-12 MED ORDER — ONDANSETRON HCL 8 MG PO TABS
8.0000 mg | ORAL_TABLET | Freq: Three times a day (TID) | ORAL | 2 refills | Status: AC | PRN
  Filled 2023-07-12: qty 30, 10d supply, fill #0

## 2023-07-12 NOTE — Progress Notes (Signed)
 Virtual Visit via Video Note   This visit type was conducted per patient request This format is felt to be appropriate for this patient at this time.  All issues noted in this document were discussed and addressed.  A limited physical exam was performed with this format.  A verbal consent was obtained for the virtual visit.   Date:  07/12/2023   ID:  Cheryl Beard, DOB 12/09/1955, MRN [id number]  Patient Location: Home Provider Location: Office/Clinic  PCP:  Mercy Stall, MD   Chief Complaint  Patient presents with   Vomiting/diarrhea     History of Present Illness:    The patient does not have symptoms concerning for COVID-19 infection (fever, chills, cough, or new shortness of breath).   Discussed the use of AI scribe software for clinical note transcription with the patient, who gave verbal consent to proceed.  History of Present Illness   Cheryl Beard is a 68 year old female who presents with persistent nausea, vomiting, and diarrhea.  She has been experiencing nausea and vomiting for over a week, beginning around July 01, 2023. The symptoms started a few days after her grandchild tested positive for COVID-19 on June 2 or 3, 2025. The pattern of symptoms includes two days of vomiting, followed by two to three days of diarrhea, and then a return to vomiting. Intermittent fever has also been present during this period.  The nausea is persistent and severe, making it difficult for her to tolerate. She has been unable to retain fluids, as they are eventually vomited. There is no blood or mucus in the diarrhea, but she has noticed 'little strings' of blood in her vomit, which she attributes to irritation from frequent vomiting. No cough or shortness of breath is present. There is no severe abdominal pain, only muscle aches from vomiting.  She has not taken any medications specifically for her gastrointestinal symptoms, only ibuprofen for headache and fever. She recalls a  mild case of diverticulitis in the past but does not believe it is related to her current symptoms.  She lives alone and has not been in contact with others since taking her grandchild to the doctor. She briefly felt better after a breakfast outing about a week after her symptoms started, but the symptoms returned shortly after. She has been consuming ginger ale and Sprite but has run out and plans to have her daughter bring drinks with electrolytes.        Past Medical History:  Diagnosis Date   Allergy    CAD (coronary artery disease)    CHF (congestive heart failure) (HCC)    Chronic idiopathic gout involving toe of left foot without tophus 05/10/2019   Depression    Erosive esophagitis    Erosive gastritis    Fibromyalgia    Gallbladder disease    GERD (gastroesophageal reflux disease)    GERD without esophagitis 05/10/2019   History of Clostridium difficile infection    History of COVID-19    History of myocardial infarction    Hyperlipidemia    Hypertension    Hypertensive heart disease without heart failure 05/10/2019   Hypertensive urgency    Idiopathic gout    Left hip pain 01/23/2021   Lumbar back pain 01/23/2021   Mixed hyperlipidemia 08/14/2014   Morbid obesity (HCC) 05/14/2019   Morbid obesity with BMI of 45.0-49.9, adult (HCC) 05/14/2019   Myocardial infarction (HCC)    2010   Nontoxic multinodular goiter    Osteopenia 12/13/2019  Other asthma    Overactive bladder 05/11/2022   Prediabetes 05/05/2021   Sleep apnea    Telogen effluvium 05/10/2019   Uncomplicated asthma 08/10/2019   Vitamin D  deficiency     Past Surgical History:  Procedure Laterality Date   ABDOMINAL HYSTERECTOMY     one ovary removed   APPENDECTOMY     CATARACT EXTRACTION  10/2021   CHOLECYSTECTOMY     EYE SURGERY     HERNIA REPAIR     ROTATOR CUFF REPAIR     TUBAL LIGATION      Family History  Problem Relation Age of Onset   Cancer Mother    Parkinson's disease Mother     Diabetes Mother    Hypertension Mother    Hyperlipidemia Mother    Stroke Father    Hyperlipidemia Sister    Hypertension Sister    Fibromyalgia Sister    Cancer Sister    Breast cancer Maternal Aunt    Cancer Maternal Aunt    Breast cancer Maternal Aunt    Alzheimer's disease Maternal Grandmother    Cancer Paternal Grandmother        Leukemia    Social History   Socioeconomic History   Marital status: Widowed    Spouse name: Not on file   Number of children: 3   Years of education: Not on file   Highest education level: GED or equivalent  Occupational History   Not on file  Tobacco Use   Smoking status: Former    Current packs/day: 0.00    Average packs/day: 1.5 packs/day for 25.0 years (37.5 ttl pk-yrs)    Types: Cigarettes    Quit date: 05/30/2000    Years since quitting: 23.1   Smokeless tobacco: Never  Vaping Use   Vaping status: Never Used  Substance and Sexual Activity   Alcohol use: Never   Drug use: Never   Sexual activity: Not Currently  Other Topics Concern   Not on file  Social History Narrative   Not on file   Social Drivers of Health   Financial Resource Strain: Low Risk  (05/07/2023)   Overall Financial Resource Strain (CARDIA)    Difficulty of Paying Living Expenses: Not very hard  Food Insecurity: No Food Insecurity (05/07/2023)   Hunger Vital Sign    Worried About Running Out of Food in the Last Year: Never true    Ran Out of Food in the Last Year: Never true  Transportation Needs: No Transportation Needs (05/07/2023)   PRAPARE - Administrator, Civil Service (Medical): No    Lack of Transportation (Non-Medical): No  Physical Activity: Insufficiently Active (05/07/2023)   Exercise Vital Sign    Days of Exercise per Week: 3 days    Minutes of Exercise per Session: 30 min  Stress: No Stress Concern Present (05/07/2023)   [employer] of Occupational Health - Occupational Stress Questionnaire    Feeling of Stress : Only a little   Social Connections: Moderately Isolated (05/07/2023)   Social Connection and Isolation Panel    Frequency of Communication with Friends and Family: More than three times a week    Frequency of Social Gatherings with Friends and Family: Twice a week    Attends Religious Services: More than 4 times per year    Active Member of [employer] or Organizations: No    Attends Banker Meetings: Not on file    Marital Status: Widowed  Intimate Partner Violence: Not At Risk (05/11/2023)  Humiliation, Afraid, Rape, and Kick questionnaire    Fear of Current or Ex-Partner: No    Emotionally Abused: No    Physically Abused: No    Sexually Abused: No    Outpatient Medications Prior to Visit  Medication Sig Dispense Refill   allopurinol  (ZYLOPRIM ) 300 MG tablet Take 1 tablet (300 mg total) by mouth daily. 90 tablet 1   amLODipine  (NORVASC ) 2.5 MG tablet Take 1 tablet (2.5 mg total) by mouth daily. 90 tablet 3   aspirin EC 81 MG tablet Take 81 mg by mouth daily.     CALCIUM  PO Take 600 mg by mouth 2 (two) times daily.     cholecalciferol (VITAMIN D3) 25 MCG (1000 UNIT) tablet Take 1,000 Units by mouth daily.     EDARBI  80 MG TABS Take 1 tablet (80 mg total) by mouth daily. 90 tablet 1   metoprolol  succinate (TOPROL -XL) 25 MG 24 hr tablet Take 1 tablet (25 mg total) by mouth daily. 90 tablet 1   Multiple Vitamins-Minerals (WOMENS MULTIVITAMIN PLUS PO) Take 1 tablet by mouth every morning.     nitroGLYCERIN  (NITROSTAT ) 0.4 MG SL tablet Dissolve 1 tablet under the tongue every 5 minutes as needed for chest pain. Do not exceed a total of 3 doses in 15 minutes. 25 tablet 3   Omega-3 Fatty Acids (FISH OIL) 1000 MG CAPS Take 2 capsules by mouth daily. 2 capsules am and 1 capsule pm     rosuvastatin  (CRESTOR ) 20 MG tablet Take 1 tablet (20 mg total) by mouth at bedtime. 90 tablet 1   Vibegron  (GEMTESA ) 75 MG TABS Take 1 tablet (75 mg total) by mouth daily. 30 tablet 6   No facility-administered  medications prior to visit.    Allergies  Allergen Reactions   Candesartan    Clarithromycin    Colesevelam    Duloxetine    Lovaza [Omega-3-Acid Ethyl Esters (Fish)] Nausea Only    GI upset   Pregabalin      Social History   Tobacco Use   Smoking status: Former    Current packs/day: 0.00    Average packs/day: 1.5 packs/day for 25.0 years (37.5 ttl pk-yrs)    Types: Cigarettes    Quit date: 05/30/2000    Years since quitting: 23.1   Smokeless tobacco: Never  Vaping Use   Vaping status: Never Used  Substance Use Topics   Alcohol use: Never   Drug use: Never     Review of Systems  Constitutional:  Positive for fever and malaise/fatigue.  HENT:  Negative for congestion, ear pain and sore throat.   Respiratory:  Negative for cough and shortness of breath.   Cardiovascular:  Negative for chest pain.  Gastrointestinal:  Positive for diarrhea and vomiting.  Genitourinary:  Negative for dysuria.  Musculoskeletal:  Negative for joint pain and myalgias.  Neurological:  Negative for dizziness, weakness and headaches.     Labs/Other Tests and Data Reviewed:    Recent Labs: 05/11/2023: ALT 18; BUN 17; Creatinine, Ser 0.93; Hemoglobin 13.6; Platelets 192; Potassium 5.0; Sodium 142   Recent Lipid Panel Lab Results  Component Value Date/Time   CHOL 135 05/11/2023 09:45 AM   TRIG 160 (H) 05/11/2023 09:45 AM   HDL 44 05/11/2023 09:45 AM   CHOLHDL 3.1 05/11/2023 09:45 AM   LDLCALC 64 05/11/2023 09:45 AM    Wt Readings from Last 3 Encounters:  07/12/23 263 lb (119.3 kg)  06/22/23 260 lb (117.9 kg)  05/11/23 263 lb (119.3 kg)  Objective:    Vital Signs:  BP 134/72   Pulse 72   Ht 5' 2 (1.575 m)   Wt 263 lb (119.3 kg)   BMI 48.10 kg/m    Physical Exam  Unable to perform due to it being a virtual.   ASSESSMENT & PLAN:   Viral gastritis Assessment & Plan: Acute nausea, vomiting, and diarrhea post-COVID exposure. Viral etiology suspected. Dehydration risk due to  fluid loss. - Prescribed Zofran  for nausea and vomiting. - Advised fluid intake with electrolytes. - Recommended bland diet. - Instructed to seek emergency care for significant blood in vomit. - Advised to contact clinic if symptoms persist beyond the week.  Orders: -     Ondansetron  HCl; Take 1 tablet (8 mg total) by mouth every 8 (eight) hours as needed for nausea or vomiting.  Dispense: 30 tablet; Refill: 2  Dehydration, mild Assessment & Plan: Risk due to prolonged vomiting and diarrhea. Emphasized electrolyte replacement. - Encouraged intake of fluids with electrolytes. - Advised emergency care for severe dehydration symptoms.     Exposure to COVID-19 with symptoms similar to previous infection. Viral gastroenteritis likely. No antibiotics needed. - Monitor symptoms and seek medical attention if respiratory symptoms develop.        No orders of the defined types were placed in this encounter.    Meds ordered this encounter  Medications   ondansetron  (ZOFRAN ) 8 MG tablet    Sig: Take 1 tablet (8 mg total) by mouth every 8 (eight) hours as needed for nausea or vomiting.    Dispense:  30 tablet    Refill:  2     Follow Up:  In Person prn

## 2023-07-12 NOTE — Telephone Encounter (Signed)
 Duplicate,  triaged already

## 2023-07-12 NOTE — Assessment & Plan Note (Signed)
 Risk due to prolonged vomiting and diarrhea. Emphasized electrolyte replacement. - Encouraged intake of fluids with electrolytes. - Advised emergency care for severe dehydration symptoms.

## 2023-07-12 NOTE — Telephone Encounter (Signed)
 Copied from CRM [id number]. Topic: Clinical - Red Word Triage >> Jul 12, 2023 10:25 AM Tiffini S wrote: [facility] that prompted transfer to Nurse Triage: Patient daughter tested positive for COVID, having symptoms for vomiting and diaherra for three days. Patient had a low fever for 100.6   FYI Only or Action Required?: FYI only for provider  Patient was last seen in primary care on 05/11/2023 by Mercy Stall, MD. Called Nurse Triage reporting Covid Exposure. Symptoms began a week ago. Interventions attempted: Nothing. Symptoms are: gradually worsening.  Triage Disposition: Go to ED or PCP/Alternative with Approval  Patient/caregiver understands and will follow disposition?: Yes   Reason for Disposition  [1] MODERATE vomiting (e.g., 3 - 5 times/day) AND [2] age > 60 years  Answer Assessment - Initial Assessment Questions 1. VOMITING SEVERITY: How many times have you vomited in the past 24 hours?     - MILD:  1 - 2 times/day    - MODERATE: 3 - 5 times/day, decreased oral intake without significant weight loss or symptoms of dehydration    - SEVERE: 6 or more times/day, vomits everything or nearly everything, with significant weight loss, symptoms of dehydration      Moderate  2. ONSET: When did the vomiting begin?      Approximately 1 week ago  3. FLUIDS: What fluids or food have you vomited up today? Have you been able to keep any fluids down?     Some 4. ABDOMEN PAIN: Are your having any abdomen pain? If Yes : How bad is it and what does it feel like? (e.g., crampy, dull, intermittent, constant)      0/10 5. DIARRHEA: Is there any diarrhea? If Yes, ask: How many times today?      Yes, multiple time 6. CONTACTS: Is there anyone else in the family with the same symptoms?      Recently exposed to COVID 7. CAUSE: What do you think is causing your vomiting?     COVID 8. HYDRATION STATUS: Any signs of dehydration? (e.g., dry mouth [not only dry lips], too weak to stand)  When did you last urinate?     Yes 9. OTHER SYMPTOMS: Do you have any other symptoms? (e.g., fever, headache, vertigo, vomiting blood or coffee grounds, recent head injury)     Fever  Answer Assessment - Initial Assessment Questions 1. COVID-19 EXPOSURE: Please describe how you were exposed to someone with a COVID-19 infection.     Close contact with family   2. PLACE of CONTACT: Where were you when you were exposed to COVID-19? (e.g., home, school, medical waiting room; which city?)     Home  3. TYPE of CONTACT: How much contact was there? (e.g., sitting next to, live in same house, work in same office, same building)     Close contact, family member   4. DURATION of CONTACT: How long were you in contact with the COVID-19 patient? (e.g., a few seconds, passed by person, a few minutes, 15 minutes or longer, live with the patient)     Extended time  5. MASK: Were you wearing a mask? Was the other person wearing a mask? Note: wearing a mask reduces the risk of an otherwise close contact.     No  6. DATE of CONTACT: When did you have contact with a COVID-19 patient? (e.g., how many days ago)     07/01/23  8. SYMPTOMS: Do you have any symptoms? (e.g., fever, cough, breathing difficulty, loss of taste  or smell)     Vomiting, diarrhea, fever   9. VACCINE: Have you gotten the COVID-19 vaccine? If Yes, ask: Which one, how many shots, when did you get it?     Yes, 3 shots  11. HIGH RISK: Do you have any heart or lung problems? (e.g., asthma, COPD, heart failure) Do you have a weak immune system or other risk factors? (e.g., HIV positive, chemotherapy, renal failure, diabetes mellitus, sickle cell anemia, obesity)       No  Protocols used: Coronavirus (COVID-19) Exposure-A-AH, Vomiting-A-AH

## 2023-07-12 NOTE — Assessment & Plan Note (Signed)
 Acute nausea, vomiting, and diarrhea post-COVID exposure. Viral etiology suspected. Dehydration risk due to fluid loss. - Prescribed Zofran  for nausea and vomiting. - Advised fluid intake with electrolytes. - Recommended bland diet. - Instructed to seek emergency care for significant blood in vomit. - Advised to contact clinic if symptoms persist beyond the week.

## 2023-07-12 NOTE — Patient Instructions (Signed)
 VISIT SUMMARY:  During your visit, we discussed your persistent nausea, vomiting, and diarrhea, which began after your grandchild tested positive for COVID-19. You have been experiencing these symptoms for over a week, along with intermittent fever and difficulty retaining fluids.  YOUR PLAN:  -GASTROENTERITIS: Gastroenteritis is an inflammation of the stomach and intestines, often caused by a viral infection. You are experiencing acute nausea, vomiting, and diarrhea likely due to a viral infection following COVID-19 exposure. We have prescribed Zofran  to help with nausea and vomiting. Please ensure you drink fluids with electrolytes and follow a bland diet. If you notice significant blood in your vomit, seek emergency care immediately. If your symptoms persist beyond this week, contact our clinic.  -DEHYDRATION: Dehydration occurs when your body loses more fluids than it takes in, which can happen with prolonged vomiting and diarrhea. To prevent dehydration, drink fluids with electrolytes regularly. If you experience severe symptoms of dehydration, such as dizziness, confusion, or fainting, seek emergency care.  -COVID-19 EXPOSURE: You have been exposed to COVID-19, and your symptoms are similar to those of a viral gastroenteritis, which does not require antibiotics. Monitor your symptoms closely and seek medical attention if you develop any respiratory issues.  INSTRUCTIONS:  Please follow up with the clinic if your symptoms persist beyond this week. Seek emergency care if you notice significant blood in your vomit or experience severe dehydration symptoms.

## 2023-07-12 NOTE — Telephone Encounter (Signed)
 Copied from CRM [id number]. Topic: Clinical - Red Word Triage >> Jul 12, 2023 10:25 AM Tiffini S wrote: [facility] that prompted transfer to Nurse Triage: Patient daughter tested positive for COVID, having symptoms for vomiting and diaherra for three days. Patient had a low fever for 100.6

## 2023-07-13 ENCOUNTER — Other Ambulatory Visit (HOSPITAL_COMMUNITY): Payer: Self-pay

## 2023-07-13 ENCOUNTER — Other Ambulatory Visit: Payer: Self-pay

## 2023-08-04 ENCOUNTER — Other Ambulatory Visit: Payer: Self-pay | Admitting: Family Medicine

## 2023-08-04 ENCOUNTER — Telehealth: Payer: Self-pay

## 2023-08-04 DIAGNOSIS — Z818 Family history of other mental and behavioral disorders: Secondary | ICD-10-CM

## 2023-08-04 NOTE — Telephone Encounter (Signed)
 Copied from CRM [id number]. Topic: Clinical - Request for Lab/Test Order >> Aug 04, 2023  3:27 PM Carlatta H wrote: Reason for CRM: Patient is requesting labs for alzheimers//

## 2023-08-05 NOTE — Telephone Encounter (Signed)
 Message sent via [employer]

## 2023-08-06 ENCOUNTER — Other Ambulatory Visit

## 2023-08-06 DIAGNOSIS — Z818 Family history of other mental and behavioral disorders: Secondary | ICD-10-CM | POA: Diagnosis not present

## 2023-08-09 ENCOUNTER — Other Ambulatory Visit (HOSPITAL_COMMUNITY): Payer: Self-pay

## 2023-08-10 ENCOUNTER — Ambulatory Visit: Payer: Self-pay | Admitting: Family Medicine

## 2023-08-10 LAB — ATN PROFILE
A -- Beta-amyloid 42/40 Ratio: 0.11 (ref >0.102)
Beta-amyloid 40: 204.13 pg/mL
Beta-amyloid 42: 22.47 pg/mL
N -- NfL, Plasma: 1.8 pg/mL (ref 0.00–3.65)
T -- p-tau181: 1.11 pg/mL — ABNORMAL HIGH (ref 0.00–0.97)

## 2023-08-12 ENCOUNTER — Other Ambulatory Visit (HOSPITAL_COMMUNITY): Payer: Self-pay

## 2023-09-08 ENCOUNTER — Other Ambulatory Visit: Payer: Self-pay

## 2023-09-08 ENCOUNTER — Other Ambulatory Visit (HOSPITAL_COMMUNITY): Payer: Self-pay

## 2023-09-15 ENCOUNTER — Other Ambulatory Visit: Payer: Self-pay

## 2023-09-15 ENCOUNTER — Other Ambulatory Visit: Payer: Self-pay | Admitting: Family Medicine

## 2023-09-15 ENCOUNTER — Other Ambulatory Visit (HOSPITAL_COMMUNITY): Payer: Self-pay

## 2023-09-15 DIAGNOSIS — S92401A Displaced unspecified fracture of right great toe, initial encounter for closed fracture: Secondary | ICD-10-CM | POA: Diagnosis not present

## 2023-09-15 DIAGNOSIS — S93601A Unspecified sprain of right foot, initial encounter: Secondary | ICD-10-CM | POA: Diagnosis not present

## 2023-09-15 DIAGNOSIS — M76821 Posterior tibial tendinitis, right leg: Secondary | ICD-10-CM | POA: Diagnosis not present

## 2023-09-15 MED ORDER — METOPROLOL SUCCINATE ER 25 MG PO TB24
25.0000 mg | ORAL_TABLET | Freq: Every day | ORAL | 1 refills | Status: AC
  Filled 2023-09-15: qty 90, 90d supply, fill #0
  Filled 2023-12-13: qty 90, 90d supply, fill #1

## 2023-09-15 MED ORDER — ROSUVASTATIN CALCIUM 20 MG PO TABS
20.0000 mg | ORAL_TABLET | Freq: Every day | ORAL | 1 refills | Status: AC
  Filled 2023-09-15: qty 90, 90d supply, fill #0
  Filled 2023-12-13: qty 90, 90d supply, fill #1

## 2023-09-15 MED ORDER — ALLOPURINOL 300 MG PO TABS
300.0000 mg | ORAL_TABLET | Freq: Every day | ORAL | 1 refills | Status: AC
  Filled 2023-09-15: qty 90, 90d supply, fill #0
  Filled 2023-12-13: qty 90, 90d supply, fill #1

## 2023-09-17 ENCOUNTER — Other Ambulatory Visit: Payer: Self-pay

## 2023-09-17 ENCOUNTER — Other Ambulatory Visit (HOSPITAL_COMMUNITY): Payer: Self-pay

## 2023-09-17 ENCOUNTER — Ambulatory Visit: Admitting: Obstetrics and Gynecology

## 2023-09-17 ENCOUNTER — Encounter: Payer: Self-pay | Admitting: Obstetrics and Gynecology

## 2023-09-17 VITALS — BP 167/78 | HR 66

## 2023-09-17 DIAGNOSIS — N3281 Overactive bladder: Secondary | ICD-10-CM | POA: Diagnosis not present

## 2023-09-17 DIAGNOSIS — N812 Incomplete uterovaginal prolapse: Secondary | ICD-10-CM

## 2023-09-17 DIAGNOSIS — Z960 Presence of urogenital implants: Secondary | ICD-10-CM

## 2023-09-17 DIAGNOSIS — R350 Frequency of micturition: Secondary | ICD-10-CM

## 2023-09-17 MED ORDER — TROSPIUM CHLORIDE ER 60 MG PO CP24
60.0000 mg | ORAL_CAPSULE | Freq: Every day | ORAL | 5 refills | Status: DC
  Filled 2023-09-17 – 2023-10-28 (×2): qty 30, 30d supply, fill #0

## 2023-09-17 NOTE — Progress Notes (Signed)
  Urogynecology   Subjective:     Chief Complaint:  Chief Complaint  Patient presents with   Pessary Check    LAQUITA HARLAN is a 68 y.o. female is here for pessary check/cleaning.   History of Present Illness: HAYDEE JABBOUR is a 68 y.o. female with stage II pelvic organ prolapse who presents for a pessary check. She is using a size #5 short stem gellhorn pessary. The pessary has been working well and she has no complaints. She is not using vaginal estrogen. She denies vaginal bleeding.  Patient's main concern is her OAB leakage. The bulking has helped the leakage with cough and sneeze, but not the overactive bladder spasms. She and Dr. Marilynne previously discussed other options such as PTNS, SNM, and bladder botox.   Past Medical History: Patient  has a past medical history of Allergy, CAD (coronary artery disease), CHF (congestive heart failure) (HCC), Chronic idiopathic gout involving toe of left foot without tophus (05/10/2019), Depression, Erosive esophagitis, Erosive gastritis, Fibromyalgia, Gallbladder disease, GERD (gastroesophageal reflux disease), GERD without esophagitis (05/10/2019), History of Clostridium difficile infection, History of COVID-19, History of myocardial infarction, Hyperlipidemia, Hypertension, Hypertensive heart disease without heart failure (05/10/2019), Hypertensive urgency, Idiopathic gout, Left hip pain (01/23/2021), Lumbar back pain (01/23/2021), Mixed hyperlipidemia (08/14/2014), Morbid obesity (HCC) (05/14/2019), Morbid obesity with BMI of 45.0-49.9, adult (HCC) (05/14/2019), Myocardial infarction (HCC), Nontoxic multinodular goiter, Osteopenia (12/13/2019), Other asthma, Overactive bladder (05/11/2022), Prediabetes (05/05/2021), Sleep apnea, Telogen effluvium (05/10/2019), Uncomplicated asthma (08/10/2019), and Vitamin D  deficiency.   Past Surgical History: She  has a past surgical history that includes Abdominal hysterectomy;  Cholecystectomy; Appendectomy; Rotator cuff repair; Cataract extraction (10/2021); Hernia repair; Tubal ligation; and Eye surgery.   Medications: She has a current medication list which includes the following prescription(s): allopurinol , amlodipine , aspirin ec, calcium , cholecalciferol, edarbi , metoprolol  succinate, multiple vitamins-minerals, nitroglycerin , fish oil, ondansetron , rosuvastatin , trospium  chloride, and gemtesa .   Allergies: Patient is allergic to candesartan, clarithromycin, colesevelam, duloxetine, lovaza [omega-3-acid ethyl esters (fish)], and pregabalin.   Social History: Patient  reports that she quit smoking about 23 years ago. Her smoking use included cigarettes. She has a 37.5 pack-year smoking history. She has never used smokeless tobacco. She reports that she does not drink alcohol and does not use drugs.      Objective:    Physical Exam: BP (!) 167/78   Pulse 66  Gen: No apparent distress, A&O x 3. Detailed Urogynecologic Evaluation:  Pelvic Exam: Normal external female genitalia; Bartholin's and Skene's glands normal in appearance; urethral meatus normal in appearance, no urethral masses or discharge. The pessary was noted to be in place. It was removed and cleaned. Speculum exam revealed no lesions in the vagina. The pessary was replaced. It was comfortable to the patient and fit well.     Assessment/Plan:    Assessment: Ms. Payton is a 68 y.o. with stage II pelvic organ prolapse here for a pessary check. She is doing well.  Plan: She will keep the pessary in place until next visit. She will continue to use coconut oil. She will follow-up in 4 months for a pessary check or sooner as needed.   Will start a conjunction medication Trospium  60mg  ER daily with the Gemtesa  75mg  to see if this is effective in controlling her leakage. If this is not helpful we can have patient re-discuss tertiary therapies with Dr. Marilynne. Patient is agreeable to plan of care.  She will follow up in 6 weeks for a virtual visit  for this.   All questions were answered.

## 2023-09-17 NOTE — Patient Instructions (Signed)
 Continue Gemtesa  75mg  daily. I will also send in another bladder medication that works differently called Trospium  to see if the combination therapy helps your bladder.   If you want to discuss surgery we can get you in with the surgeon for your next appointment.

## 2023-09-21 ENCOUNTER — Other Ambulatory Visit: Payer: Self-pay

## 2023-10-06 DIAGNOSIS — S93601D Unspecified sprain of right foot, subsequent encounter: Secondary | ICD-10-CM | POA: Diagnosis not present

## 2023-10-06 DIAGNOSIS — S92401D Displaced unspecified fracture of right great toe, subsequent encounter for fracture with routine healing: Secondary | ICD-10-CM | POA: Diagnosis not present

## 2023-10-06 DIAGNOSIS — M25571 Pain in right ankle and joints of right foot: Secondary | ICD-10-CM | POA: Diagnosis not present

## 2023-10-06 DIAGNOSIS — M76821 Posterior tibial tendinitis, right leg: Secondary | ICD-10-CM | POA: Diagnosis not present

## 2023-10-17 ENCOUNTER — Other Ambulatory Visit: Payer: Self-pay | Admitting: Obstetrics and Gynecology

## 2023-10-18 ENCOUNTER — Other Ambulatory Visit (HOSPITAL_COMMUNITY): Payer: Self-pay

## 2023-10-21 ENCOUNTER — Other Ambulatory Visit (HOSPITAL_COMMUNITY): Payer: Self-pay

## 2023-10-26 ENCOUNTER — Other Ambulatory Visit: Payer: Self-pay | Admitting: Obstetrics and Gynecology

## 2023-10-26 ENCOUNTER — Other Ambulatory Visit (HOSPITAL_COMMUNITY): Payer: Self-pay

## 2023-10-28 ENCOUNTER — Other Ambulatory Visit: Payer: Self-pay

## 2023-10-28 ENCOUNTER — Other Ambulatory Visit (HOSPITAL_COMMUNITY): Payer: Self-pay

## 2023-10-29 ENCOUNTER — Telehealth: Admitting: Obstetrics and Gynecology

## 2023-10-29 DIAGNOSIS — R350 Frequency of micturition: Secondary | ICD-10-CM

## 2023-10-29 DIAGNOSIS — N3281 Overactive bladder: Secondary | ICD-10-CM

## 2023-10-29 NOTE — Progress Notes (Signed)
 Patient was unable to get her medication due to cost and has paused her Gemtesa . She reports she will get the Trospium  and follow up with me in a few weeks.

## 2023-11-03 DIAGNOSIS — M76821 Posterior tibial tendinitis, right leg: Secondary | ICD-10-CM | POA: Diagnosis not present

## 2023-11-03 DIAGNOSIS — M25571 Pain in right ankle and joints of right foot: Secondary | ICD-10-CM | POA: Diagnosis not present

## 2023-11-03 DIAGNOSIS — S92401D Displaced unspecified fracture of right great toe, subsequent encounter for fracture with routine healing: Secondary | ICD-10-CM | POA: Diagnosis not present

## 2023-11-05 ENCOUNTER — Other Ambulatory Visit: Payer: Self-pay

## 2023-11-08 ENCOUNTER — Other Ambulatory Visit (HOSPITAL_COMMUNITY): Payer: Self-pay

## 2023-11-08 ENCOUNTER — Other Ambulatory Visit: Payer: Self-pay

## 2023-11-08 MED ORDER — AMLODIPINE BESYLATE 2.5 MG PO TABS
2.5000 mg | ORAL_TABLET | Freq: Every day | ORAL | 0 refills | Status: DC
  Filled 2023-11-08: qty 90, 90d supply, fill #0

## 2023-11-10 ENCOUNTER — Ambulatory Visit: Admitting: Family Medicine

## 2023-11-10 ENCOUNTER — Encounter: Payer: Self-pay | Admitting: Family Medicine

## 2023-11-10 VITALS — BP 138/78 | HR 68 | Temp 97.9°F | Resp 18 | Ht 62.0 in | Wt 258.4 lb

## 2023-11-10 DIAGNOSIS — I119 Hypertensive heart disease without heart failure: Secondary | ICD-10-CM | POA: Diagnosis not present

## 2023-11-10 DIAGNOSIS — Z23 Encounter for immunization: Secondary | ICD-10-CM

## 2023-11-10 DIAGNOSIS — M1A0720 Idiopathic chronic gout, left ankle and foot, without tophus (tophi): Secondary | ICD-10-CM

## 2023-11-10 DIAGNOSIS — I2510 Atherosclerotic heart disease of native coronary artery without angina pectoris: Secondary | ICD-10-CM

## 2023-11-10 DIAGNOSIS — Z1231 Encounter for screening mammogram for malignant neoplasm of breast: Secondary | ICD-10-CM | POA: Diagnosis not present

## 2023-11-10 DIAGNOSIS — E782 Mixed hyperlipidemia: Secondary | ICD-10-CM

## 2023-11-10 LAB — POCT LIPID PANEL
Glucose: 82
HDL: 40
LDL: 46
Non-HDL: 71
TC/HDL: 1.1
TC: 111
TRG: 125

## 2023-11-10 NOTE — Assessment & Plan Note (Addendum)
 Blood pressure well-controlled. - Continue current antihypertensive regimen and aspirin.

## 2023-11-10 NOTE — Assessment & Plan Note (Addendum)
Well controlled.  The current medical regimen is effective;  continue present plan and medications. 

## 2023-11-10 NOTE — Assessment & Plan Note (Deleted)
 Cheryl Beard

## 2023-11-10 NOTE — Assessment & Plan Note (Addendum)
 Continue allopurinol. Well controlled.

## 2023-11-10 NOTE — Assessment & Plan Note (Addendum)
 Lipid profile improved on current regimen. - Continue Crestor  20 mg once daily and fish oil. Orders:   POCT Lipid Panel

## 2023-11-10 NOTE — Patient Instructions (Signed)
  VISIT SUMMARY: During your visit, we discussed your persistent foot pain, heel pain, bladder incontinence, and overall health maintenance. We reviewed your current medications and made plans for ongoing management.  YOUR PLAN: LEFT FOOT AND TOE FRACTURE: Your foot and toe fracture is healing but still tender. -Continue to monitor the healing process. Wear comfortable shoes.  IDIOPATHIC CHRONIC GOUT, LEFT FOOT AND ANKLE: You have chronic gout in your left foot and ankle. -Continue taking allopurinol  300 mg once daily.  HEEL SPUR AND ARTHRITIS, LEFT FOOT: You have heel pain due to spurs and arthritis. -Continue current management for heel pain and arthritis.  OVERACTIVE BLADDER WITH INCONTINENCE: You have bladder incontinence likely due to an overactive bladder. -Monitor your response to trospium .  HYPERTENSIVE HEART DISEASE AND ATHEROSCLEROTIC HEART DISEASE OF NATIVE CORONARY ARTERY: Your blood pressure is well-controlled. -Continue your current antihypertensive regimen and aspirin.  MIXED HYPERLIPIDEMIA: Your lipid profile has improved. -Continue taking Crestor  20 mg once daily and fish oil.  GENERAL HEALTH MAINTENANCE: We discussed your general health maintenance. -You received a flu shot. -You declined the COVID booster. -Your A1c levels have been excellent. -A mammogram is due in December. We will order it for you.                      Contains text generated by Abridge.                                 Contains text generated by Abridge.

## 2023-11-10 NOTE — Progress Notes (Signed)
 Subjective:  Patient ID: Cheryl Beard, female    DOB: 02-Aug-1955  Age: 68 y.o. MRN: [id number]  Chief Complaint  Patient presents with   Medical Management of Chronic Issues    HPI: Discussed the use of AI scribe software for clinical note transcription with the patient, who gave verbal consent to proceed.  History of Present Illness Cheryl Beard is a 68 year old female who presents with persistent foot pain.  Foot pain and toe injury - Persistent foot pain since August 2025 following a toe fracture sustained at [facility] when her toe became caught under a structure while chasing her granddaughter - Initial treatment included use of a boot - Pain persists but she is now able to wear a shoe more comfortably - Toes are healing but remain tender  Heel pain and arthralgia - History of heel spurs and arthritis - Pain primarily localized under the heel  Bladder incontinence - Bladder incontinence described as 'aggravating' - Currently taking trospium  for management - Previously used Gemtesa   Constitutional and upper respiratory symptoms - No chest pain, fevers, chills, sweats, earaches, sore throat, or stuffy nose - Mild congestion present       06/22/2023   10:05 AM 11/10/2022    8:54 AM 06/16/2022   10:34 AM 05/11/2022    9:08 AM 01/09/2022    9:02 AM  Depression screen PHQ 2/9  Decreased Interest 0 0 0 0 0  Down, Depressed, Hopeless 0 0 0 0 0  PHQ - 2 Score 0 0 0 0 0        06/18/2023    8:52 AM  Fall Risk   Falls in the past year? 0  Number falls in past yr: 0  Injury with Fall? 0  Risk for fall due to : No Fall Risks  Follow up Falls evaluation completed;Education provided;Falls prevention discussed    Patient Care Team: Sherre Clapper, MD as PCP - General (Family Medicine) Bernie Lamar PARAS, MD as Consulting Physician (Cardiology)   Review of Systems  Constitutional:  Negative for chills, diaphoresis, fatigue and fever.  HENT:   Negative for congestion, ear pain and sinus pain.   Eyes: Negative.   Respiratory:  Negative for cough and shortness of breath.   Cardiovascular:  Negative for chest pain.  Gastrointestinal:  Negative for abdominal pain, constipation, diarrhea, nausea and vomiting.  Endocrine: Negative.   Genitourinary:  Negative for dysuria, frequency and urgency.  Musculoskeletal:  Negative for arthralgias.  Allergic/Immunologic: Negative.   Neurological:  Negative for dizziness, weakness, light-headedness and headaches.  Hematological: Negative.   Psychiatric/Behavioral:  Negative for dysphoric mood. The patient is not nervous/anxious.     Current Outpatient Medications on File Prior to Visit  Medication Sig Dispense Refill   allopurinol  (ZYLOPRIM ) 300 MG tablet Take 1 tablet (300 mg total) by mouth daily. 90 tablet 1   amLODipine  (NORVASC ) 2.5 MG tablet Take 1 tablet (2.5 mg total) by mouth daily. 90 tablet 0   aspirin EC 81 MG tablet Take 81 mg by mouth daily.     CALCIUM  PO Take 600 mg by mouth 2 (two) times daily.     cholecalciferol (VITAMIN D3) 25 MCG (1000 UNIT) tablet Take 1,000 Units by mouth daily.     EDARBI  80 MG TABS Take 1 tablet (80 mg total) by mouth daily. 90 tablet 1   metoprolol  succinate (TOPROL -XL) 25 MG 24 hr tablet Take 1 tablet (25 mg total) by mouth daily. 90 tablet 1  Multiple Vitamins-Minerals (WOMENS MULTIVITAMIN PLUS PO) Take 1 tablet by mouth every morning.     nitroGLYCERIN  (NITROSTAT ) 0.4 MG SL tablet Dissolve 1 tablet under the tongue every 5 minutes as needed for chest pain. Do not exceed a total of 3 doses in 15 minutes. 25 tablet 3   Omega-3 Fatty Acids (FISH OIL) 1000 MG CAPS Take 2 capsules by mouth in the morning and at bedtime.     ondansetron  (ZOFRAN ) 8 MG tablet Take 1 tablet (8 mg total) by mouth every 8 (eight) hours as needed for nausea or vomiting. 30 tablet 2   Oyster Shell Calcium  500 MG TABS Take 1 tablet by mouth daily.     rosuvastatin  (CRESTOR ) 20 MG  tablet Take 1 tablet (20 mg total) by mouth at bedtime. 90 tablet 1   Trospium  Chloride 60 MG CP24 Take 1 capsule by mouth daily.     No current facility-administered medications on file prior to visit.   Past Medical History:  Diagnosis Date   Allergy    CAD (coronary artery disease)    CHF (congestive heart failure) (HCC)    Chronic idiopathic gout involving toe of left foot without tophus 05/10/2019   Depression    Erosive esophagitis    Erosive gastritis    Fibromyalgia    Gallbladder disease    GERD (gastroesophageal reflux disease)    GERD without esophagitis 05/10/2019   History of Clostridium difficile infection    History of COVID-19    History of myocardial infarction    Hyperlipidemia    Hypertension    Hypertensive heart disease without heart failure 05/10/2019   Hypertensive urgency    Idiopathic gout    Left hip pain 01/23/2021   Lumbar back pain 01/23/2021   Mixed hyperlipidemia 08/14/2014   Morbid obesity (HCC) 05/14/2019   Morbid obesity with BMI of 45.0-49.9, adult (HCC) 05/14/2019   Myocardial infarction (HCC)    2010   Nontoxic multinodular goiter    Osteopenia 12/13/2019   Other asthma    Overactive bladder 05/11/2022   Prediabetes 05/05/2021   Sleep apnea    Telogen effluvium 05/10/2019   Uncomplicated asthma 08/10/2019   Vitamin D  deficiency    Past Surgical History:  Procedure Laterality Date   ABDOMINAL HYSTERECTOMY     one ovary removed   APPENDECTOMY     CARDIAC CATHETERIZATION     CATARACT EXTRACTION  10/2021   CHOLECYSTECTOMY     EYE SURGERY     HERNIA REPAIR     ROTATOR CUFF REPAIR     TUBAL LIGATION      Family History  Problem Relation Age of Onset   Cancer Mother    Parkinson's disease Mother    Diabetes Mother    Hypertension Mother    Hyperlipidemia Mother    Stroke Father    Hyperlipidemia Sister    Hypertension Sister    Fibromyalgia Sister    Cancer Sister    Breast cancer Maternal Aunt    Cancer Maternal Aunt     Breast cancer Maternal Aunt    Alzheimer's disease Maternal Grandmother    Cancer Paternal Grandmother        Leukemia   Social History   Socioeconomic History   Marital status: Widowed    Spouse name: Not on file   Number of children: 3   Years of education: Not on file   Highest education level: GED or equivalent  Occupational History   Not on file  Tobacco  Use   Smoking status: Former    Current packs/day: 0.00    Average packs/day: 1.5 packs/day for 25.0 years (37.5 ttl pk-yrs)    Types: Cigarettes    Quit date: 05/30/2000    Years since quitting: 23.4   Smokeless tobacco: Never  Vaping Use   Vaping status: Never Used  Substance and Sexual Activity   Alcohol use: Never   Drug use: Never   Sexual activity: Not Currently  Other Topics Concern   Not on file  Social History Narrative   Not on file   Social Drivers of Health   Financial Resource Strain: Low Risk  (11/03/2023)   Overall Financial Resource Strain (CARDIA)    Difficulty of Paying Living Expenses: Not very hard  Food Insecurity: No Food Insecurity (11/03/2023)   Hunger Vital Sign    Worried About Running Out of Food in the Last Year: Never true    Ran Out of Food in the Last Year: Never true  Transportation Needs: No Transportation Needs (11/03/2023)   PRAPARE - Administrator, Civil Service (Medical): No    Lack of Transportation (Non-Medical): No  Physical Activity: Insufficiently Active (11/03/2023)   Exercise Vital Sign    Days of Exercise per Week: 2 days    Minutes of Exercise per Session: 20 min  Stress: No Stress Concern Present (11/03/2023)   [employer] of Occupational Health - Occupational Stress Questionnaire    Feeling of Stress: Only a little  Social Connections: Moderately Isolated (11/03/2023)   Social Connection and Isolation Panel    Frequency of Communication with Friends and Family: More than three times a week    Frequency of Social Gatherings with Friends and  Family: Twice a week    Attends Religious Services: More than 4 times per year    Active Member of [employer] or Organizations: No    Attends Banker Meetings: Not on file    Marital Status: Widowed    Objective:  BP 138/78   Pulse 68   Temp 97.9 F (36.6 C) (Temporal)   Resp 18   Ht 5' 2 (1.575 m)   Wt 258 lb 6.4 oz (117.2 kg)   SpO2 95%   BMI 47.26 kg/m      11/10/2023    9:10 AM 09/17/2023    9:44 AM 09/17/2023    9:23 AM  BP/Weight  Systolic BP 138 167 151  Diastolic BP 78 78 66  Wt. (Lbs) 258.4    BMI 47.26 kg/m2      Physical Exam Vitals reviewed.  Constitutional:      Appearance: Normal appearance. She is obese.  Neck:     Vascular: No carotid bruit.  Cardiovascular:     Rate and Rhythm: Normal rate and regular rhythm.     Heart sounds: Normal heart sounds.  Pulmonary:     Effort: Pulmonary effort is normal. No respiratory distress.     Breath sounds: Normal breath sounds.  Abdominal:     General: Abdomen is flat. Bowel sounds are normal.     Palpations: Abdomen is soft.     Tenderness: There is no abdominal tenderness.  Neurological:     Mental Status: She is alert and oriented to person, place, and time.  Psychiatric:        Mood and Affect: Mood normal.        Behavior: Behavior normal.         Lab Results  Component Value Date  WBC 9.4 05/11/2023   HGB 13.6 05/11/2023   HCT 40.9 05/11/2023   PLT 192 05/11/2023   GLUCOSE 89 05/11/2023   CHOL 135 05/11/2023   TRIG 160 (H) 05/11/2023   HDL 44 05/11/2023   LDLCALC 64 05/11/2023   ALT 18 05/11/2023   AST 23 05/11/2023   NA 142 05/11/2023   K 5.0 05/11/2023   CL 104 05/11/2023   CREATININE 0.93 05/11/2023   BUN 17 05/11/2023   CO2 21 05/11/2023   TSH 2.170 05/11/2022   HGBA1C 5.5 05/11/2023    Results for orders placed or performed in visit on 11/10/23  POCT Lipid Panel   Collection Time: 11/10/23  9:38 AM  Result Value Ref Range   TC 111    HDL 40    TRG 125    LDL  46    Non-HDL 71    TC/HDL 1.1    Glucose 82   .  Assessment & Plan:   Assessment & Plan Hypertensive heart disease without heart failure Blood pressure well-controlled. - Continue current antihypertensive regimen and aspirin.    Mixed hyperlipidemia Lipid profile improved on current regimen. - Continue Crestor  20 mg once daily and fish oil. Orders:   POCT Lipid Panel  Chronic idiopathic gout involving toe of left foot without tophus Continue allopurinol . Well controlled.      Coronary artery disease involving native coronary artery of native heart without angina pectoris Well controlled.  The current medical regimen is effective;  continue present plan and medications.       Encounter for immunization  Orders:   Flu vaccine HIGH DOSE PF(Fluzone Trivalent)  Visit for screening mammogram  Orders:   MM DIGITAL SCREENING BILATERAL; Future    Body mass index is 47.26 kg/m.     No orders of the defined types were placed in this encounter.   Orders Placed This Encounter  Procedures   MM DIGITAL SCREENING BILATERAL   Flu vaccine HIGH DOSE PF(Fluzone Trivalent)   POCT Lipid Panel       Follow-up: Return in about 6 months (around 05/10/2024) for chronic follow up.  An After Visit Summary was printed and given to the patient.  Abigail Free, MD [facility] [phone]

## 2023-11-26 ENCOUNTER — Other Ambulatory Visit (HOSPITAL_COMMUNITY): Payer: Self-pay

## 2023-12-13 ENCOUNTER — Other Ambulatory Visit (HOSPITAL_COMMUNITY): Payer: Self-pay

## 2023-12-28 ENCOUNTER — Other Ambulatory Visit: Payer: Self-pay | Admitting: Family Medicine

## 2024-01-12 ENCOUNTER — Ambulatory Visit

## 2024-01-12 VITALS — BP 130/72 | HR 72 | Ht 62.0 in | Wt 256.5 lb

## 2024-01-12 DIAGNOSIS — I10 Essential (primary) hypertension: Secondary | ICD-10-CM

## 2024-01-12 DIAGNOSIS — G4730 Sleep apnea, unspecified: Secondary | ICD-10-CM | POA: Insufficient documentation

## 2024-01-12 DIAGNOSIS — I509 Heart failure, unspecified: Secondary | ICD-10-CM | POA: Insufficient documentation

## 2024-01-12 DIAGNOSIS — I2510 Atherosclerotic heart disease of native coronary artery without angina pectoris: Secondary | ICD-10-CM | POA: Diagnosis not present

## 2024-01-12 DIAGNOSIS — E782 Mixed hyperlipidemia: Secondary | ICD-10-CM

## 2024-01-12 NOTE — Assessment & Plan Note (Signed)
 Blood pressure well-controlled. Continue azilsartan 80 mg once daily , metoprolol  XL 25 mg once daily  amlodipine  2.5 mg once daily

## 2024-01-12 NOTE — Assessment & Plan Note (Signed)
 Doing well well-controlled. Continue rosuvastatin  20 mg once daily.

## 2024-01-12 NOTE — Assessment & Plan Note (Signed)
 Asymptomatic. Good functional status however not regularly exercising. Recommended exercise routinely 20 to 30 minutes a day 5 times a week.  Continue aspirin 81 mg once daily and rosuvastatin  20 mg once daily.

## 2024-01-12 NOTE — Progress Notes (Signed)
 Cardiology Consultation:    Date:  01/12/2024   ID:  Cheryl Beard, DOB [birth year], MRN [id number]  PCP:  Sherre Clapper, MD  Cardiologist:  Alean SAUNDERS Anysa Tacey, MD   Referring MD: Sherre Clapper, MD   No chief complaint on file.    ASSESSMENT AND PLAN:   Cheryl Beard 68 year old woman history of mild nonobstructive CAD, hypertension, hyperlipidemia, former smoker [quit in 2002], asthma, prior hysterectomy and cholecystectomy. Last cardiac CT from 12/11/2022 noted calcium  score 202, with left main/LAD, CAD RADS 2 disease with mild ostial LAD stenosis. Echocardiogram 12/14/2022 noted LVEF 60 to 65%, normal diastolic parameters, trace MR.  Here for routine follow-up visit Problem List Items Addressed This Visit       Cardiovascular and Mediastinum   CAD, reports history of cath in 2010 at [facility], cardiac PET Septem09/2010 mildly abnormal; stress nuclear imaging 07/2014 at [facility] unremarkable per pt; CT Cors Ca score 202, Mild LAD 11/2022   Asymptomatic. Good functional status however not regularly exercising. Recommended exercise routinely 20 to 30 minutes a day 5 times a week.  Continue aspirin 81 mg once daily and rosuvastatin  20 mg once daily.       Hypertension   Blood pressure well-controlled. Continue azilsartan 80 mg once daily , metoprolol  XL 25 mg once daily  amlodipine  2.5 mg once daily         Other   Mixed hyperlipidemia - Primary   Doing well well-controlled. Continue rosuvastatin  20 mg once daily.      Relevant Orders   EKG 12-Lead (Completed)    Recommend continued follow-up with PCP and see us  in the office in 1 year or as needed.  History of Present Illness:    Cheryl Beard is a 68 y.o. female who is being seen today for follow-up visit. PCP is Cox, Kirsten, MD. Last visit with me in the office was 12/22/2022.  Has history of mild nonobstructive CAD, hypertension, hyperlipidemia, former smoker [quit in 2002], asthma, prior  hysterectomy and cholecystectomy. Last cardiac CT from 12/11/2022 noted calcium  score 202, with left main/LAD, CAD RADS 2 disease with mild ostial LAD stenosis.  Here for the visit by herself.  Mentions lives at home by herself.  Doing well overall no active cardiac symptoms. 1 instance few weeks ago at night she felt a sensation of fast heartbeat that lasted for several seconds and subsided promptly and no further recurrence of symptoms. Denies any syncopal or near syncopal episodes.  No blood in urine or stools.  Does not routinely check blood pressures at home. Good compliance with her medications.   EKG in the clinic today shows sinus rhythm heart rate 72/min, PR interval 190 ms, QRS duration 78 ms, QTc 429 ms, no ischemic changes.  Point-of-care lipid panel testing at PCPs office 11/10/2023 LDL 46, HDL 40, total Vaslow 111 and triglycerides 125.   Past Medical History:  Diagnosis Date   Allergy    CAD (coronary artery disease)    CHF (congestive heart failure) (HCC)    Chronic idiopathic gout involving toe of left foot without tophus 05/10/2019   Dehydration, mild 07/12/2023   Depression    Erosive esophagitis    Erosive gastritis    Fibromyalgia    Gallbladder disease    GERD (gastroesophageal reflux disease)    GERD without esophagitis 05/10/2019   History of Clostridium difficile infection    History of COVID-19    History of myocardial infarction    Hyperlipidemia  Hypertension    Hypertensive heart disease without heart failure 05/10/2019   Hypertensive urgency    Idiopathic gout    Left hip pain 01/23/2021   Lumbar back pain 01/23/2021   Mixed hyperlipidemia 08/14/2014   Morbid obesity (HCC) 05/14/2019   Morbid obesity with BMI of 45.0-49.9, adult (HCC) 05/14/2019   Myocardial infarction (HCC)    2010   Nontoxic multinodular goiter    Osteopenia 12/13/2019   Other asthma    Overactive bladder 05/11/2022   Prediabetes 05/05/2021   Sleep apnea    SUI  (stress urinary incontinence, female) 04/15/2023   Telogen effluvium 05/10/2019   Uncomplicated asthma 08/10/2019   Viral gastritis 07/12/2023   Vitamin D  deficiency     Past Surgical History:  Procedure Laterality Date   ABDOMINAL HYSTERECTOMY     one ovary removed   APPENDECTOMY     CARDIAC CATHETERIZATION     CATARACT EXTRACTION  10/2021   CHOLECYSTECTOMY     EYE SURGERY     HERNIA REPAIR     ROTATOR CUFF REPAIR     TUBAL LIGATION      Current Medications: Active Medications[1]   Allergies:   Candesartan, Clarithromycin, Colesevelam, Duloxetine, Lovaza [omega-3-acid ethyl esters (fish)], and Pregabalin   Social History   Socioeconomic History   Marital status: Widowed    Spouse name: Not on file   Number of children: 3   Years of education: Not on file   Highest education level: GED or equivalent  Occupational History   Not on file  Tobacco Use   Smoking status: Former    Current packs/day: 0.00    Average packs/day: 1.5 packs/day for 25.0 years (37.5 ttl pk-yrs)    Types: Cigarettes    Quit date: 05/30/2000    Years since quitting: 23.6   Smokeless tobacco: Never  Vaping Use   Vaping status: Never Used  Substance and Sexual Activity   Alcohol use: Never   Drug use: Never   Sexual activity: Not Currently  Other Topics Concern   Not on file  Social History Narrative   Not on file   Social Drivers of Health   Tobacco Use: Medium Risk (01/12/2024)   Patient History    Smoking Tobacco Use: Former    Smokeless Tobacco Use: Never    Passive Exposure: Not on Actuary Strain: Low Risk (11/03/2023)   Overall Financial Resource Strain (CARDIA)    Difficulty of Paying Living Expenses: Not very hard  Food Insecurity: No Food Insecurity (11/03/2023)   Epic    Worried About Radiation Protection Practitioner of Food in the Last Year: Never true    Ran Out of Food in the Last Year: Never true  Transportation Needs: No Transportation Needs (11/03/2023)   Epic    Lack of  Transportation (Medical): No    Lack of Transportation (Non-Medical): No  Physical Activity: Insufficiently Active (11/03/2023)   Exercise Vital Sign    Days of Exercise per Week: 2 days    Minutes of Exercise per Session: 20 min  Stress: No Stress Concern Present (11/03/2023)   [employer] of Occupational Health - Occupational Stress Questionnaire    Feeling of Stress: Only a little  Social Connections: Moderately Isolated (11/03/2023)   Social Connection and Isolation Panel    Frequency of Communication with Friends and Family: More than three times a week    Frequency of Social Gatherings with Friends and Family: Twice a week    Attends Religious Services: More  than 4 times per year    Active Member of Clubs or Organizations: No    Attends Banker Meetings: Not on file    Marital Status: Widowed  Depression (PHQ2-9): Low Risk (06/22/2023)   Depression (PHQ2-9)    PHQ-2 Score: 0  Alcohol Screen: Low Risk (06/12/2022)   Alcohol Screen    Last Alcohol Screening Score (AUDIT): 0  Housing: Low Risk (11/03/2023)   Epic    Unable to Pay for Housing in the Last Year: No    Number of Times Moved in the Last Year: 0    Homeless in the Last Year: No  Utilities: Not At Risk (06/12/2022)   AHC Utilities    Threatened with loss of utilities: No  Health Literacy: Adequate Health Literacy (11/10/2022)   B1300 Health Literacy    Frequency of need for help with medical instructions: Never     Family History: The patient's family history includes Alzheimer's disease in her maternal grandmother; Breast cancer in her maternal aunt and maternal aunt; Cancer in her maternal aunt, mother, paternal grandmother, and sister; Diabetes in her mother; Fibromyalgia in her sister; Hyperlipidemia in her mother and sister; Hypertension in her mother and sister; Parkinson's disease in her mother; Stroke in her father. ROS:   Please see the history of present illness.    All 14 point review of  systems negative except as described per history of present illness.  EKGs/Labs/Other Studies Reviewed:    The following studies were reviewed today:   EKG:  EKG Interpretation Date/Time:  Wednesday January 12 2024 10:56:53 EST Ventricular Rate:  72 PR Interval:  190 QRS Duration:  78 QT Interval:  392 QTC Calculation: 429 R Axis:   10  Text Interpretation: Normal sinus rhythm Possible Anterior infarct (cited on or before 20-Nov-2022) Abnormal ECG When compared with ECG of 20-Nov-2022 12:31, No significant change was found Confirmed by Liborio Hai reddy [phone]) on 01/12/2024 11:24:49 AM    Recent Labs: 05/11/2023: ALT 18; BUN 17; Creatinine, Ser 0.93; Hemoglobin 13.6; Platelets 192; Potassium 5.0; Sodium 142  Recent Lipid Panel    Component Value Date/Time   CHOL 135 05/11/2023 0945   TRIG 160 (H) 05/11/2023 0945   HDL 44 05/11/2023 0945   CHOLHDL 3.1 05/11/2023 0945   LDLCALC 64 05/11/2023 0945    Physical Exam:    VS:  BP 130/72   Pulse 72   Ht 5' 2 (1.575 m)   Wt 256 lb 8 oz (116.3 kg)   SpO2 97%   BMI 46.91 kg/m     Wt Readings from Last 3 Encounters:  01/12/24 256 lb 8 oz (116.3 kg)  11/10/23 258 lb 6.4 oz (117.2 kg)  07/12/23 263 lb (119.3 kg)     GENERAL:  Well nourished, well developed in no acute distress NECK: No JVD; No carotid bruits CARDIAC: RRR, S1 and S2 present, no murmurs, no rubs, no gallops CHEST:  Clear to auscultation without rales, wheezing or rhonchi  Extremities: No pitting pedal edema. Pulses bilaterally symmetric with radial 2+ and dorsalis pedis 2+ NEUROLOGIC:  Alert and oriented x 3  Medication Adjustments/Labs and Tests Ordered: Current medicines are reviewed at length with the patient today.  Concerns regarding medicines are outlined above.  Orders Placed This Encounter  Procedures   EKG 12-Lead   No orders of the defined types were placed in this encounter.   Signed, Hai jess Liborio, MD, MPH,  [facility]. 01/12/2024 11:36 AM    [facility]    [1]  Current Meds  Medication Sig   allopurinol  (ZYLOPRIM ) 300 MG tablet Take 1 tablet (300 mg total) by mouth daily.   amLODipine  (NORVASC ) 2.5 MG tablet Take 1 tablet (2.5 mg total) by mouth daily.   aspirin EC 81 MG tablet Take 81 mg by mouth daily.   CALCIUM  PO Take 600 mg by mouth 2 (two) times daily.   cholecalciferol (VITAMIN D3) 25 MCG (1000 UNIT) tablet Take 1,000 Units by mouth daily.   EDARBI  80 MG TABS TAKE 1 TABLET BY MOUTH ONCE DAILY   metoprolol  succinate (TOPROL -XL) 25 MG 24 hr tablet Take 1 tablet (25 mg total) by mouth daily.   Multiple Vitamins-Minerals (WOMENS MULTIVITAMIN PLUS PO) Take 1 tablet by mouth every morning.   nitroGLYCERIN  (NITROSTAT ) 0.4 MG SL tablet Dissolve 1 tablet under the tongue every 5 minutes as needed for chest pain. Do not exceed a total of 3 doses in 15 minutes.   Omega-3 Fatty Acids (FISH OIL) 1000 MG CAPS Take 2 capsules by mouth in the morning and at bedtime.   ondansetron  (ZOFRAN ) 8 MG tablet Take 1 tablet (8 mg total) by mouth every 8 (eight) hours as needed for nausea or vomiting.   Oyster Shell Calcium  500 MG TABS Take 1 tablet by mouth daily.   rosuvastatin  (CRESTOR ) 20 MG tablet Take 1 tablet (20 mg total) by mouth at bedtime.

## 2024-01-12 NOTE — Patient Instructions (Signed)
Medication Instructions:  Your physician recommends that you continue on your current medications as directed. Please refer to the Current Medication list given to you today.  *If you need a refill on your cardiac medications before your next appointment, please call your pharmacy*   Lab Work: None ordered If you have labs (blood work) drawn today and your tests are completely normal, you will receive your results only by: MyChart Message (if you have MyChart) OR A paper copy in the mail If you have any lab test that is abnormal or we need to change your treatment, we will call you to review the results.   Testing/Procedures: None ordered   Follow-Up: At [facility], you and your health needs are our priority.  As part of our continuing mission to provide you with exceptional heart care, we have created designated Provider Care Teams.  These Care Teams include your primary Cardiologist (physician) and Advanced Practice Providers (APPs -  Physician Assistants and Nurse Practitioners) who all work together to provide you with the care you need, when you need it.  We recommend signing up for the patient portal called "MyChart".  Sign up information is provided on this After Visit Summary.  MyChart is used to connect with patients for Virtual Visits (Telemedicine).  Patients are able to view lab/test results, encounter notes, upcoming appointments, etc.  Non-urgent messages can be sent to your provider as well.   To learn more about what you can do with MyChart, go to [email or web address].    Your next appointment:   12 month(s)  The format for your next appointment:   In Person  Provider:   Huntley Dec, MD    Other Instructions none  Important Information About Sugar

## 2024-01-24 ENCOUNTER — Ambulatory Visit
Admission: RE | Admit: 2024-01-24 | Discharge: 2024-01-24 | Disposition: A | Source: Ambulatory Visit | Attending: Family Medicine | Admitting: Family Medicine

## 2024-01-24 DIAGNOSIS — Z1231 Encounter for screening mammogram for malignant neoplasm of breast: Secondary | ICD-10-CM

## 2024-01-27 ENCOUNTER — Encounter (INDEPENDENT_AMBULATORY_CARE_PROVIDER_SITE_OTHER): Payer: Self-pay

## 2024-01-28 ENCOUNTER — Ambulatory Visit: Payer: Self-pay | Admitting: Family Medicine

## 2024-02-01 ENCOUNTER — Other Ambulatory Visit (HOSPITAL_COMMUNITY): Payer: Self-pay

## 2024-02-01 ENCOUNTER — Other Ambulatory Visit: Payer: Self-pay

## 2024-02-01 MED ORDER — AMLODIPINE BESYLATE 2.5 MG PO TABS
2.5000 mg | ORAL_TABLET | Freq: Every day | ORAL | 3 refills | Status: AC
  Filled 2024-02-01: qty 90, 90d supply, fill #0

## 2024-02-17 ENCOUNTER — Other Ambulatory Visit: Payer: Self-pay | Admitting: Family Medicine

## 2024-02-17 ENCOUNTER — Telehealth: Payer: Self-pay

## 2024-02-17 MED ORDER — OLMESARTAN MEDOXOMIL 40 MG PO TABS
40.0000 mg | ORAL_TABLET | Freq: Every day | ORAL | 0 refills | Status: AC
  Filled 2024-02-17: qty 90, 90d supply, fill #0

## 2024-02-17 NOTE — Telephone Encounter (Signed)
 Patient informed of results.

## 2024-02-17 NOTE — Telephone Encounter (Signed)
 Copied from CRM #[id number]. Topic: Clinical - Medication Question >> Feb 17, 2024 10:46 AM Myrick T wrote: Reason for CRM: patient called stated she is about out of the EDARBI  80 MG TABS and provider was going to change her meds to a more affordable medication. Please f/u with patient

## 2024-02-18 ENCOUNTER — Other Ambulatory Visit: Payer: Self-pay

## 2024-05-10 ENCOUNTER — Ambulatory Visit: Admitting: Family Medicine
